# Patient Record
Sex: Male | Born: 1970 | Race: Black or African American | Hispanic: No | Marital: Single | Smoking: Never smoker
Health system: Southern US, Community
[De-identification: ages and names within clinical notes are randomized; demographics above are authoritative.]

## PROBLEM LIST (undated history)

## (undated) ENCOUNTER — Emergency Department: Admission: EM | Discharge status: Absent without leave | Payer: BLUE CROSS/BLUE SHIELD

## (undated) DIAGNOSIS — A6920 Lyme disease, unspecified: Secondary | ICD-10-CM

## (undated) DIAGNOSIS — I10 Essential (primary) hypertension: Secondary | ICD-10-CM

## (undated) DIAGNOSIS — R911 Solitary pulmonary nodule: Secondary | ICD-10-CM

## (undated) DIAGNOSIS — N200 Calculus of kidney: Secondary | ICD-10-CM

## (undated) DIAGNOSIS — Z8619 Personal history of other infectious and parasitic diseases: Secondary | ICD-10-CM

## (undated) DIAGNOSIS — E781 Pure hyperglyceridemia: Secondary | ICD-10-CM

## (undated) DIAGNOSIS — Z8701 Personal history of pneumonia (recurrent): Secondary | ICD-10-CM

## (undated) HISTORY — DX: Personal history of pneumonia (recurrent): Z87.01

## (undated) HISTORY — DX: Solitary pulmonary nodule: R91.1

## (undated) HISTORY — DX: Calculus of kidney: N20.0

## (undated) HISTORY — DX: Lyme disease, unspecified: A69.20

## (undated) HISTORY — DX: Personal history of other infectious and parasitic diseases: Z86.19

## (undated) HISTORY — DX: Essential (primary) hypertension: I10

## (undated) HISTORY — DX: Pure hyperglyceridemia: E78.1

---

## 2000-11-19 DIAGNOSIS — Z8701 Personal history of pneumonia (recurrent): Secondary | ICD-10-CM

## 2000-11-19 HISTORY — DX: Personal history of pneumonia (recurrent): Z87.01

## 2000-11-19 HISTORY — PX: US ECHOCARDIOGRAPHY: HXRAD669

## 2005-06-27 ENCOUNTER — Ambulatory Visit: Payer: Self-pay | Admitting: Internal Medicine

## 2007-07-28 ENCOUNTER — Ambulatory Visit: Payer: Self-pay | Admitting: Internal Medicine

## 2007-07-28 DIAGNOSIS — R079 Chest pain, unspecified: Secondary | ICD-10-CM | POA: Insufficient documentation

## 2007-07-29 ENCOUNTER — Encounter: Payer: Self-pay | Admitting: Internal Medicine

## 2007-07-29 DIAGNOSIS — R7989 Other specified abnormal findings of blood chemistry: Secondary | ICD-10-CM | POA: Insufficient documentation

## 2007-07-31 LAB — CONVERTED CEMR LAB
HCV Ab: NEGATIVE
Hep A IgM: NEGATIVE
Hep B C IgM: NEGATIVE

## 2007-08-11 ENCOUNTER — Ambulatory Visit: Payer: Self-pay | Admitting: Cardiology

## 2007-09-01 ENCOUNTER — Ambulatory Visit: Payer: Self-pay | Admitting: Internal Medicine

## 2007-09-01 LAB — CONVERTED CEMR LAB
ALT: 32 U/L (ref 0–53)
AST: 21 U/L (ref 0–37)
Albumin: 4 g/dL (ref 3.5–5.2)
Alkaline Phosphatase: 64 U/L (ref 39–117)
Bilirubin, Direct: 0.1 mg/dL (ref 0.0–0.3)
Total Bilirubin: 0.7 mg/dL (ref 0.3–1.2)
Total Protein: 7.8 g/dL (ref 6.0–8.3)

## 2007-09-04 ENCOUNTER — Telehealth (INDEPENDENT_AMBULATORY_CARE_PROVIDER_SITE_OTHER): Payer: Self-pay | Admitting: *Deleted

## 2008-01-20 ENCOUNTER — Telehealth (INDEPENDENT_AMBULATORY_CARE_PROVIDER_SITE_OTHER): Payer: Self-pay | Admitting: *Deleted

## 2010-12-17 LAB — CONVERTED CEMR LAB
ALT: 132 units/L — ABNORMAL HIGH (ref 0–53)
AST: 58 units/L — ABNORMAL HIGH (ref 0–37)
Alkaline Phosphatase: 58 units/L (ref 39–117)
Basophils Absolute: 0 10*3/uL (ref 0.0–0.1)
Bilirubin, Direct: 0.1 mg/dL (ref 0.0–0.3)
CO2: 33 meq/L — ABNORMAL HIGH (ref 19–32)
Creatinine, Ser: 1 mg/dL (ref 0.4–1.5)
GFR calc non Af Amer: 91 mL/min
Glucose, Bld: 117 mg/dL — ABNORMAL HIGH (ref 70–99)
HCT: 44.4 % (ref 39.0–52.0)
MCHC: 35.8 g/dL (ref 30.0–36.0)
Neutrophils Relative %: 56.7 % (ref 43.0–77.0)
Phosphorus: 3.1 mg/dL (ref 2.3–4.6)
Platelets: 271 10*3/uL (ref 150–400)
RBC: 5.21 M/uL (ref 4.22–5.81)
RDW: 11.9 % (ref 11.5–14.6)
Sodium: 142 meq/L (ref 135–145)
WBC: 7 10*3/uL (ref 4.5–10.5)

## 2011-04-03 NOTE — Assessment & Plan Note (Signed)
[facility] OFFICE NOTE   MYNOR, WITKOP                      MRN:          [id number]  DATE:08/11/2007                            DOB:          12/12/1970    Dr. Alphonsus Sias asked [place] to consult on Shawn Vaughn, a delightful 40-year-  old single black male with an abnormal EKG.  He saw Dr. Alphonsus Sias the other  day.  He complained of some left lateral thoracic pain off and on for a  year.  It was described as a cramp.  Sometimes he feels a little  tingling in his left arm.   He denies any exertional angina or ischemic equivalent.  He absolutely  has no cardiac risk factors for premature coronary disease.   His EKG in Dr. Karle Starch office showed some ST segment changes, in  particularly leads 2 and 3.  However, there was significant variation in  the isoelectric baseline.   PAST MEDICAL HISTORY:  1. Does not smoke.  2. Does not drink.  3. Does not use any recreational products.  4. He does not think he has an elevated cholesterol.  5. There is no premature history of coronary disease.  6. His mother is healthy but she has hypertension.  7. He is on no medications.  8. He has had no surgeries.   SOCIAL HISTORY:  1. He is a Loss adjuster, chartered.  2. He is single.  3. He has no children.  4. He likes to [employer].   REVIEW OF SYSTEMS:  Negative other than a history of pneumonia 4 years  ago.   EXAM:  He is very pleasant.  His blood pressure is 130/90, his pulse is  91 and regular, his weight is 183.  HEENT:  Normocephalic, atraumatic, PERRLA, extraocular movements intact.  Sclera clear.  Facial symmetry is normal.  Carotid upstrokes are equal  bilaterally without bruits, no JVD.  Thyroid is not enlarged, trachea is  midline.  LUNGS:  Clear.  There is no sign of a rub either anteriorly or  posteriorly or laterally.  His PMI is nondisplaced.  He has normal S1,  S2.  There is no murmur, rub, gallop or click  ABDOMINAL EXAM:  Soft with good bowel sounds.  There is no epigastric  tenderness.  EXTREMITIES:  No clubbing, cyanosis or edema, pulses are intact.   I repeated his EKG here in the office which is remarkable for minimal  early repolarization.  Otherwise, it is normal.   ASSESSMENT:  1. Noncardiac chest pain.  2. Abnormal EKG, probably minimal early repolarization.   PLAN:  Reassurance.   He obviously takes good care of himself.  I have asked him to continue  to do so.  Will see him back on a p.r.n. basis.     Thomas C. Daleen Squibb, MD, [facility]  Electronically Signed    TCW/MedQ  DD: 08/11/2007  DT: 08/11/2007  Job #: [id number]   cc:   Karie Schwalbe, MD

## 2011-05-11 ENCOUNTER — Encounter: Payer: Self-pay | Admitting: Cardiovascular Disease

## 2012-02-05 ENCOUNTER — Other Ambulatory Visit: Payer: Self-pay | Admitting: Family Medicine

## 2012-04-16 ENCOUNTER — Other Ambulatory Visit: Payer: Self-pay | Admitting: Family Medicine

## 2012-04-24 ENCOUNTER — Other Ambulatory Visit: Payer: Self-pay | Admitting: Family Medicine

## 2012-05-08 ENCOUNTER — Other Ambulatory Visit: Payer: Self-pay | Admitting: Physician Assistant

## 2012-05-08 ENCOUNTER — Other Ambulatory Visit: Payer: Self-pay | Admitting: Family Medicine

## 2012-05-08 NOTE — Telephone Encounter (Signed)
Need chart

## 2012-05-13 ENCOUNTER — Telehealth: Payer: Self-pay

## 2012-05-13 MED ORDER — METOPROLOL SUCCINATE ER 100 MG PO TB24: 100.0000 mg | ORAL_TABLET | Freq: Every day | ORAL | Status: DC

## 2012-05-13 NOTE — Telephone Encounter (Signed)
FRANCES STATES HER SON WAS JUST SEEN FOR HIS DOT LAST WEEK, BUT THE PHARMACY STATES WE DENIED THE METOPROLOL AND SHE REALLY WOULD LIKE [place] TO GIVE HIM A FEW UNTIL ABLE TO COME IN AND SEE DR. 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

## 2012-05-13 NOTE — Telephone Encounter (Signed)
Spoke with pt mother advised her that we will call in refill until he get in.  He will be in this weekend for ov.  Explained to mother about why we need to do bloodwork.

## 2012-06-05 ENCOUNTER — Other Ambulatory Visit: Payer: Self-pay | Admitting: *Deleted

## 2012-06-05 MED ORDER — METOPROLOL SUCCINATE ER 100 MG PO TB24: 100.0000 mg | ORAL_TABLET | Freq: Every day | ORAL | Status: DC

## 2012-06-22 ENCOUNTER — Ambulatory Visit (INDEPENDENT_AMBULATORY_CARE_PROVIDER_SITE_OTHER): Payer: BC Managed Care – PPO | Admitting: Physician Assistant

## 2012-06-22 VITALS — BP 140/78 | HR 78 | Temp 98.7°F | Resp 18 | Ht 67.25 in | Wt 186.0 lb

## 2012-06-22 DIAGNOSIS — E7800 Pure hypercholesterolemia, unspecified: Secondary | ICD-10-CM

## 2012-06-22 DIAGNOSIS — I10 Essential (primary) hypertension: Secondary | ICD-10-CM

## 2012-06-22 DIAGNOSIS — I159 Secondary hypertension, unspecified: Secondary | ICD-10-CM | POA: Insufficient documentation

## 2012-06-22 LAB — COMPREHENSIVE METABOLIC PANEL
ALT: 27 U/L (ref 0–53)
AST: 22 U/L (ref 0–37)
Albumin: 4.3 g/dL (ref 3.5–5.2)
Calcium: 9.9 mg/dL (ref 8.4–10.5)
Chloride: 102 mEq/L (ref 96–112)
Potassium: 4.1 mEq/L (ref 3.5–5.3)

## 2012-06-22 MED ORDER — HYDROCHLOROTHIAZIDE 12.5 MG PO CAPS
12.5000 mg | ORAL_CAPSULE | Freq: Every day | ORAL | Status: DC
Start: 2012-06-22 — End: 2013-01-15

## 2012-06-22 MED ORDER — METOPROLOL SUCCINATE ER 100 MG PO TB24: 100.0000 mg | ORAL_TABLET | Freq: Every day | ORAL | Status: DC

## 2012-06-22 NOTE — Progress Notes (Signed)
  Subjective:    Patient ID: Shawn Vaughn, male    DOB: 1971-04-20, 41 y.o.   MRN: [id number]  HPI Pt presents to clinic for BP recheck and medication refill.  He is having no problems with his medications.   Bp readings at home - 120s/80s  Review of Systems  Constitutional: Negative for fever, chills and fatigue.  Respiratory: Negative for cough and chest tightness.   Cardiovascular: Negative for chest pain and leg swelling.       Objective:   Physical Exam  Vitals reviewed. Constitutional: He is oriented to person, place, and time. He appears well-developed and well-nourished.  HENT:  Head: Normocephalic and atraumatic.  Right Ear: External ear normal.  Left Ear: External ear normal.  Nose: Nose normal.  Mouth/Throat: Oropharynx is clear and moist.  Eyes: Conjunctivae are normal.  Neck: Neck supple.  Cardiovascular: Normal rate, regular rhythm and normal heart sounds.   Pulmonary/Chest: Effort normal and breath sounds normal.  Neurological: He is alert and oriented to person, place, and time.  Skin: Skin is warm and dry.  Psychiatric: He has a normal mood and affect. His behavior is normal. Judgment and thought content normal.          Assessment & Plan:   1. HTN (hypertension)  Comprehensive metabolic panel, Lipid panel, metoprolol succinate (TOPROL-XL) 100 MG 24 hr tablet, hydrochlorothiazide (MICROZIDE) 12.5 MG capsule   Recheck 6 months.

## 2012-06-26 MED ORDER — ATORVASTATIN CALCIUM 10 MG PO TABS: 10.0000 mg | ORAL_TABLET | Freq: Every day | ORAL | Status: DC

## 2012-06-26 NOTE — Addendum Note (Signed)
Addended by: Morrell Riddle on: 06/26/2012 03:34 PM   Modules accepted: Orders

## 2012-06-30 ENCOUNTER — Telehealth: Payer: Self-pay

## 2012-06-30 NOTE — Telephone Encounter (Signed)
[phone] is correct # I have called his wife back to advise I am not aware of Metoprolol causing elevated cholesterol, she indicates she has seen that it can elevate it. I advised looking at labs there is concern he may not have been fasting, she advised he was fasting for these labs. She does not want his meds changed, he has not started his cholesterol meds yet and she is advised when levels get this high, it is reccommended he take something to get this number down.

## 2012-06-30 NOTE — Telephone Encounter (Signed)
Wants to know if high colestrol is caused by [employer] at [phone]

## 2012-09-01 ENCOUNTER — Ambulatory Visit (INDEPENDENT_AMBULATORY_CARE_PROVIDER_SITE_OTHER): Payer: Self-pay | Admitting: Family Medicine

## 2012-09-01 ENCOUNTER — Encounter: Payer: Self-pay | Admitting: Family Medicine

## 2012-09-01 VITALS — BP 124/86 | HR 60 | Temp 98.1°F | Ht 67.0 in | Wt 186.0 lb

## 2012-09-01 DIAGNOSIS — Z8701 Personal history of pneumonia (recurrent): Secondary | ICD-10-CM

## 2012-09-01 DIAGNOSIS — I10 Essential (primary) hypertension: Secondary | ICD-10-CM

## 2012-09-01 DIAGNOSIS — E781 Pure hyperglyceridemia: Secondary | ICD-10-CM | POA: Insufficient documentation

## 2012-09-01 LAB — LIPID PANEL
Cholesterol: 167 mg/dL (ref 0–200)
HDL: 34.9 mg/dL — ABNORMAL LOW (ref >39.00)
Total CHOL/HDL Ratio: 5
Triglycerides: 541 mg/dL — ABNORMAL HIGH (ref 0.0–149.0)
VLDL: 108.2 mg/dL — ABNORMAL HIGH (ref 0.0–40.0)

## 2012-09-01 LAB — LDL CHOLESTEROL, DIRECT: Direct LDL: 47.6 mg/dL

## 2012-09-01 NOTE — Assessment & Plan Note (Signed)
Discussed reasons for high trig, as well as dietary interventions to decrease level. No h/o DM, no h/o gallstones, no h/o EtOH use.   Recheck today.  If staying elevated, start fibrate.

## 2012-09-01 NOTE — Assessment & Plan Note (Signed)
Chronic, stable. Continue meds. 

## 2012-09-01 NOTE — Progress Notes (Signed)
Subjective:    Patient ID: Shawn Vaughn, male    DOB: [birth year], 41 y.o.   MRN: [id number]  HPI CC: new pt to establish  Prior saw Dr. Alphonsus Sias, but not recently.  HTN - compliant with meds.  No HA, vision changes, CP/tightness, SOB, leg swelling.   Hypertriglyceridemia - was started on lipitor by [place] but not currently taking.  Denies EtOH use, no h/o gallstones.  No recent thyroid.  Caffeine: occasional sodas Lives with mom, and dad but has his own place, 4 dogs Occupation: works at [employer]: HS Activity: hunts 3x/wk Diet: good water, fruits/vegetables daily  Preventative:  DOT done 04/05/2012 Flu shot - declines Tetanus shot - unsure.  Declines today.  Medications and allergies reviewed and updated in chart.  Past histories reviewed and updated if relevant as below. Patient Active Problem List  Diagnosis  . CHEST PAIN  . ABNORMAL BLOOD CHEMISTRY NEC  . HTN (hypertension)   Past Medical History  Diagnosis Date  . Hypertension   . History of pneumonia 2002    staph  . Hypertriglyceridemia    No past surgical history on file. History  Substance Use Topics  . Smoking status: Never Smoker   . Smokeless tobacco: Never Used  . Alcohol Use: No   Family History  Problem Relation Age of Onset  . Hypertension Mother   . Hyperlipidemia Neg Hx   . Diabetes Neg Hx   . Coronary artery disease Neg Hx   . Stroke Neg Hx   . Cancer Maternal Grandfather     prostate   No Known Allergies Current Outpatient Prescriptions on File Prior to Visit  Medication Sig Dispense Refill  . hydrochlorothiazide (MICROZIDE) 12.5 MG capsule Take 1 capsule (12.5 mg total) by mouth daily.  90 capsule  1  . metoprolol succinate (TOPROL-XL) 100 MG 24 hr tablet Take 1 tablet (100 mg total) by mouth daily.  90 tablet  1     Review of Systems  Constitutional: Negative for fever, chills, activity change, appetite change, fatigue and unexpected weight change.  HENT: Negative for hearing loss  and neck pain.   Eyes: Negative for visual disturbance.  Respiratory: Negative for cough, chest tightness, shortness of breath and wheezing.   Cardiovascular: Negative for chest pain, palpitations and leg swelling.  Gastrointestinal: Negative for nausea, vomiting, abdominal pain, diarrhea, constipation, blood in stool and abdominal distention.  Genitourinary: Negative for hematuria and difficulty urinating.  Musculoskeletal: Negative for myalgias and arthralgias.  Skin: Negative for rash.  Neurological: Negative for dizziness, seizures, syncope and headaches.  Hematological: Does not bruise/bleed easily.  Psychiatric/Behavioral: Negative for dysphoric mood. The patient is not nervous/anxious.        Objective:   Physical Exam  Nursing note and vitals reviewed. Constitutional: He is oriented to person, place, and time. He appears well-developed and well-nourished. No distress.  HENT:  Head: Normocephalic and atraumatic.  Right Ear: External ear normal.  Left Ear: External ear normal.  Nose: Nose normal.  Mouth/Throat: Oropharynx is clear and moist. No oropharyngeal exudate.  Eyes: Conjunctivae normal and EOM are normal. Pupils are equal, round, and reactive to light. No scleral icterus.  Neck: Normal range of motion. Neck supple.  Cardiovascular: Normal rate, regular rhythm, normal heart sounds and intact distal pulses.   No murmur heard. Pulses:      Radial pulses are 2+ on the right side, and 2+ on the left side.  Pulmonary/Chest: Effort normal and breath sounds normal. No respiratory distress. He  has no wheezes. He has no rales.  Abdominal: Soft. Bowel sounds are normal. He exhibits no distension and no mass. There is no tenderness. There is no rebound and no guarding.  Musculoskeletal: Normal range of motion. He exhibits no edema.  Lymphadenopathy:    He has no cervical adenopathy.  Neurological: He is alert and oriented to person, place, and time.       CN grossly intact,  station and gait intact  Skin: Skin is warm and dry. No rash noted.  Psychiatric: He has a normal mood and affect. His behavior is normal. Judgment and thought content normal.       Assessment & Plan:

## 2012-09-01 NOTE — Assessment & Plan Note (Signed)
asxs. 

## 2012-09-01 NOTE — Patient Instructions (Signed)
Good to meet you today, call us with questions. Blood work today - if triglycerides staying elevated, I will recommend starting fenofibrate or tricor (We will call you with results.) Decrease added sugars, eliminate trans fats, increase fiber and limit alcohol.  All these changes together can drop triglycerides by almost 50%.

## 2012-09-03 ENCOUNTER — Other Ambulatory Visit: Payer: Self-pay | Admitting: Family Medicine

## 2012-09-03 MED ORDER — FENOFIBRATE 145 MG PO TABS: 145.0000 mg | ORAL_TABLET | Freq: Every day | ORAL | Status: DC

## 2012-10-04 ENCOUNTER — Other Ambulatory Visit: Payer: Self-pay | Admitting: Physician Assistant

## 2012-10-06 ENCOUNTER — Telehealth: Payer: Self-pay

## 2012-10-06 NOTE — Telephone Encounter (Signed)
I doubt related to fenofibrate.  May restart.  rec come in for eval.

## 2012-10-06 NOTE — Telephone Encounter (Signed)
Pt taking Fenofibrate since middle of Oct. Noticed today rash on rt side of neck size of quarter with slight itching. No difficulty swallowing or breathing. Has not changed detergents or soaps etc; nothing new. Pt wondered if could be reaction to Fenofibrate? Pt said has nothing new since starting Fenofibrate.Pt advised not to take Fenofibrate until hears back from Dr Sharen Hones. If condition changes or worsens pt to go to [facility].Please advise.

## 2012-10-07 NOTE — Telephone Encounter (Signed)
Patient notified and appt scheduled.

## 2012-10-09 ENCOUNTER — Encounter: Payer: Self-pay | Admitting: Family Medicine

## 2012-10-09 ENCOUNTER — Ambulatory Visit (INDEPENDENT_AMBULATORY_CARE_PROVIDER_SITE_OTHER): Payer: BC Managed Care – PPO | Admitting: Family Medicine

## 2012-10-09 VITALS — BP 112/78 | HR 80 | Temp 98.2°F | Wt 188.0 lb

## 2012-10-09 DIAGNOSIS — R21 Rash and other nonspecific skin eruption: Secondary | ICD-10-CM

## 2012-10-09 NOTE — Patient Instructions (Signed)
Skin rash could have been allergic reaction to something or skin irritaiton from rubbing. Could try moisturizer if this happens again, or some topical cortisone to calm down inflammation. If worsening despite cortisone, let me know. I don't think this was the cholesterol medicine.

## 2012-10-09 NOTE — Assessment & Plan Note (Signed)
Focal papular rash, resolving. Not med related. Anticipate irritated skin from rubbing on coon swallow. Reassured, discussed future care (moisturizer and OTC cortisone). Not consistent with tinea.

## 2012-10-09 NOTE — Progress Notes (Signed)
  Subjective:    Patient ID: Shawn Vaughn, male    DOB: 1971-04-13, 41 y.o.   MRN: [id number]  HPI CC: neck rash  Present for 1 wk, now resolving.  Was very pruritic.  Bumpy irritated spot.  No new lotions, detergents, soaps, shampoos. No fevers/chills, oral lesions.  Coon hunts, had coon swallow that may have been rubbing against neck.  Declines tetanus and flu shots today.  Past Medical History  Diagnosis Date  . Hypertension   . History of pneumonia 2002    staph  . Hypertriglyceridemia   . History of chicken pox      Review of Systems Per HPI    Objective:   Physical Exam  Nursing note and vitals reviewed. Skin:       Very faint papular rash right lateral nape of neck, resolving       Assessment & Plan:

## 2012-10-27 ENCOUNTER — Other Ambulatory Visit: Payer: Self-pay | Admitting: *Deleted

## 2012-10-27 MED ORDER — FENOFIBRATE 145 MG PO TABS: 145.0000 mg | ORAL_TABLET | Freq: Every day | ORAL | Status: DC

## 2012-10-28 ENCOUNTER — Other Ambulatory Visit: Payer: Self-pay | Admitting: *Deleted

## 2012-12-17 ENCOUNTER — Other Ambulatory Visit: Payer: Self-pay | Admitting: Physician Assistant

## 2012-12-17 NOTE — Telephone Encounter (Signed)
Needs office visit.

## 2013-01-12 ENCOUNTER — Other Ambulatory Visit: Payer: Self-pay | Admitting: Physician Assistant

## 2013-01-15 ENCOUNTER — Other Ambulatory Visit: Payer: Self-pay | Admitting: Physician Assistant

## 2013-03-12 ENCOUNTER — Encounter: Payer: Self-pay | Admitting: Family Medicine

## 2013-03-12 ENCOUNTER — Ambulatory Visit (INDEPENDENT_AMBULATORY_CARE_PROVIDER_SITE_OTHER): Payer: BC Managed Care – PPO | Admitting: Family Medicine

## 2013-03-12 ENCOUNTER — Encounter: Payer: Self-pay | Admitting: *Deleted

## 2013-03-12 VITALS — BP 130/88 | HR 72 | Temp 98.0°F | Wt 185.5 lb

## 2013-03-12 DIAGNOSIS — M25512 Pain in left shoulder: Secondary | ICD-10-CM | POA: Insufficient documentation

## 2013-03-12 DIAGNOSIS — E781 Pure hyperglyceridemia: Secondary | ICD-10-CM

## 2013-03-12 DIAGNOSIS — M25519 Pain in unspecified shoulder: Secondary | ICD-10-CM

## 2013-03-12 LAB — LIPID PANEL
LDL Cholesterol: 67 mg/dL (ref 0–99)
VLDL: 31.8 mg/dL (ref 0.0–40.0)

## 2013-03-12 LAB — HEPATIC FUNCTION PANEL
AST: 31 U/L (ref 0–37)
Alkaline Phosphatase: 33 U/L — ABNORMAL LOW (ref 39–117)
Total Bilirubin: 0.6 mg/dL (ref 0.3–1.2)

## 2013-03-12 MED ORDER — DICLOFENAC SODIUM 1 % TD GEL: 1.0000 "application " | Freq: Three times a day (TID) | TRANSDERMAL | Status: DC

## 2013-03-12 NOTE — Patient Instructions (Signed)
I think you have rotator cuff injury - treat with tylenol 500mg  two to three times daily, ice/heat, and exercises provided today. May also use voltaren gel to left shoulder as needed - sent into pharmacy. Blood work to check triglycerides today. Light duty for 1 wk at work. Return if not improving as expected or any worsening.

## 2013-03-12 NOTE — Assessment & Plan Note (Addendum)
Of 10 d duration, no inciting injury noted. Anticipate rotator cuff injury - intolerant of oral NSAIDs. Treat with tylenol, voltaren gel, and ice/heat. Provided with stretching exercises from [facility] pt advisor. Update if persistent or not improving as expected. Light duty at work for 1 week.

## 2013-03-12 NOTE — Progress Notes (Signed)
  Subjective:    Patient ID: Shawn Vaughn, male    DOB: Jul 11, 1971, 42 y.o.   MRN: [id number]  HPI CC: R shoulder pain  Endorses 1.5 wk h/o anterior L shoulder pain worse with raising arm above midline.  Laying on shoulder worsens pain as well.  No h/o shoulder problems in past.  Actually improved compared to last 2 days.  Hasn't tried meds for this.  Has tired ice which helped some.  Denies neck pain, shooting pain down arms, weakness or numbness of arms. Denies inciting trauma, falls, injury  No chest pain, dyspnea, nausea.  Activity - hunts, not recently. 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  Ibuprofen caused bleeding in stool 20 yrs ago.  Told he was allergic to this.  Also would like triglycerides checked.  Has been on fenofibrate since 08/2012.  No myalgias, tolerating well.  Past Medical History  Diagnosis Date  . Hypertension   . History of pneumonia 2002    staph  . Hypertriglyceridemia   . History of chicken pox      Review of Systems Per HPI    Objective:   Physical Exam  Nursing note and vitals reviewed. Constitutional: He appears well-developed and well-nourished. No distress.  Musculoskeletal:  No midline spine tenderness. No pain to palpation at shoulders, no deformity. FROM at shoulders, but tender at left shoulder between 45-120 degree abduction Neg empty can sign.  No pain with int/ext rotation against resistance. Neg speed/yerguson test No impingement. Neg crossover test. No pain with rotation of humeral head in GH joint.       Assessment & Plan:

## 2013-03-12 NOTE — Assessment & Plan Note (Signed)
Check blood work today.

## 2013-03-21 ENCOUNTER — Other Ambulatory Visit: Payer: Self-pay | Admitting: Emergency Medicine

## 2013-07-28 ENCOUNTER — Other Ambulatory Visit: Payer: Self-pay | Admitting: Family Medicine

## 2013-09-02 ENCOUNTER — Encounter: Payer: Self-pay | Admitting: Family Medicine

## 2013-09-02 ENCOUNTER — Other Ambulatory Visit: Payer: Self-pay | Admitting: Family Medicine

## 2013-09-02 ENCOUNTER — Ambulatory Visit (INDEPENDENT_AMBULATORY_CARE_PROVIDER_SITE_OTHER): Payer: BC Managed Care – PPO | Admitting: Family Medicine

## 2013-09-02 VITALS — BP 122/86 | HR 68 | Temp 98.2°F | Ht 67.0 in | Wt 186.2 lb

## 2013-09-02 DIAGNOSIS — I10 Essential (primary) hypertension: Secondary | ICD-10-CM

## 2013-09-02 DIAGNOSIS — R748 Abnormal levels of other serum enzymes: Secondary | ICD-10-CM | POA: Insufficient documentation

## 2013-09-02 DIAGNOSIS — N289 Disorder of kidney and ureter, unspecified: Secondary | ICD-10-CM

## 2013-09-02 DIAGNOSIS — Z0001 Encounter for general adult medical examination with abnormal findings: Secondary | ICD-10-CM | POA: Insufficient documentation

## 2013-09-02 DIAGNOSIS — E781 Pure hyperglyceridemia: Secondary | ICD-10-CM

## 2013-09-02 DIAGNOSIS — Z0000 Encounter for general adult medical examination without abnormal findings: Secondary | ICD-10-CM | POA: Insufficient documentation

## 2013-09-02 DIAGNOSIS — Z23 Encounter for immunization: Secondary | ICD-10-CM

## 2013-09-02 LAB — COMPREHENSIVE METABOLIC PANEL
ALT: 45 U/L (ref 0–53)
Albumin: 4.4 g/dL (ref 3.5–5.2)
CO2: 29 mEq/L (ref 19–32)
Calcium: 9.7 mg/dL (ref 8.4–10.5)
Chloride: 100 mEq/L (ref 96–112)
GFR: 62.98 mL/min (ref >60.00)
Potassium: 3.9 mEq/L (ref 3.5–5.1)
Sodium: 137 mEq/L (ref 135–145)
Total Bilirubin: 0.7 mg/dL (ref 0.3–1.2)
Total Protein: 8.2 g/dL (ref 6.0–8.3)

## 2013-09-02 LAB — LIPID PANEL: Total CHOL/HDL Ratio: 4

## 2013-09-02 MED ORDER — FENOFIBRATE 145 MG PO TABS: ORAL_TABLET | ORAL | Status: DC

## 2013-09-02 MED ORDER — HYDROCHLOROTHIAZIDE 12.5 MG PO CAPS: ORAL_CAPSULE | ORAL | Status: DC

## 2013-09-02 MED ORDER — METOPROLOL SUCCINATE ER 100 MG PO TB24: ORAL_TABLET | ORAL | Status: DC

## 2013-09-02 NOTE — Assessment & Plan Note (Signed)
Preventative protocols reviewed and updated unless pt declined. Discussed healthy diet and lifestyle.  Declines flu shot today. Tdap today.

## 2013-09-02 NOTE — Addendum Note (Signed)
Addended by: Josph Macho A on: 09/02/2013 09:23 AM   Modules accepted: Orders

## 2013-09-02 NOTE — Assessment & Plan Note (Signed)
Chronic, stable. Continue meds. 

## 2013-09-02 NOTE — Patient Instructions (Signed)
Tdap today (tetanus and pertussis shots) Blood work today Return as needed or in 1 year for next physical or med refill visit.

## 2013-09-02 NOTE — Progress Notes (Signed)
Subjective:    Patient ID: Shawn Vaughn, male    DOB: [birth year], 42 y.o.   MRN: [id number]  HPI CC: CPE  HTN - see ROS.  Compliant with meds. Hypertriglyceridemia - compliant krill oil and tricor regularly.  Due for blood work.  Planning on having baby with fiancee.  Seat belt use discussed. No suspicious lesions on skin that he's aware of.  Caffeine: occasional sodas Lives with mom, and dad but has his own place, 4 dogs Occupation: works at [employer]: HS Activity: hunts 3x/wk Diet: good water, fruits/vegetables daily  Preventative: fmhx prostate cancer 70s. Tdap today. Declines flu shot.  Medications and allergies reviewed and updated in chart.  Past histories reviewed and updated if relevant as below. Patient Active Problem List   Diagnosis Date Noted  . Left anterior shoulder pain 03/12/2013  . Skin rash 10/09/2012  . Hypertriglyceridemia   . History of pneumonia   . HTN (hypertension) 06/22/2012  . CHEST PAIN 07/28/2007   Past Medical History  Diagnosis Date  . Hypertension   . History of pneumonia 2002    staph  . Hypertriglyceridemia   . History of chicken pox    No past surgical history on file. History  Substance Use Topics  . Smoking status: Never Smoker   . Smokeless tobacco: Never Used  . Alcohol Use: No   Family History  Problem Relation Age of Onset  . Hypertension Mother   . Hyperlipidemia Neg Hx   . Diabetes Neg Hx   . Coronary artery disease Neg Hx   . Stroke Neg Hx   . Cancer Maternal Grandfather     prostate   Allergies  Allergen Reactions  . Ibuprofen     Blood in stool   Current Outpatient Prescriptions on File Prior to Visit  Medication Sig Dispense Refill  . OMEGA-3 KRILL OIL 300 MG CAPS Take 1 capsule by mouth daily.       No current facility-administered medications on file prior to visit.     Review of Systems  Constitutional: Negative for fever, chills, activity change, appetite change, fatigue and unexpected  weight change.  HENT: Negative for hearing loss.   Eyes: Negative for visual disturbance.  Respiratory: Negative for cough, chest tightness, shortness of breath and wheezing.   Cardiovascular: Negative for chest pain, palpitations and leg swelling.  Gastrointestinal: Negative for nausea, vomiting, abdominal pain, diarrhea, constipation, blood in stool and abdominal distention.  Genitourinary: Negative for hematuria and difficulty urinating.  Musculoskeletal: Negative for arthralgias, myalgias and neck pain.  Skin: Negative for rash.  Neurological: Negative for dizziness, seizures, syncope and headaches.  Hematological: Negative for adenopathy. Does not bruise/bleed easily.  Psychiatric/Behavioral: Negative for dysphoric mood. The patient is not nervous/anxious.        Objective:   Physical Exam  Nursing note and vitals reviewed. Constitutional: He is oriented to person, place, and time. He appears well-developed and well-nourished. No distress.  HENT:  Head: Normocephalic and atraumatic.  Right Ear: Hearing, tympanic membrane, external ear and ear canal normal.  Left Ear: Hearing, tympanic membrane, external ear and ear canal normal.  Nose: Nose normal.  Mouth/Throat: Oropharynx is clear and moist. No oropharyngeal exudate.  Eyes: Conjunctivae and EOM are normal. Pupils are equal, round, and reactive to light. No scleral icterus.  Neck: Normal range of motion. Neck supple. No thyromegaly present.  Cardiovascular: Normal rate, regular rhythm, normal heart sounds and intact distal pulses.   No murmur heard. Pulses:  Radial pulses are 2+ on the right side, and 2+ on the left side.  Pulmonary/Chest: Effort normal and breath sounds normal. No respiratory distress. He has no wheezes. He has no rales.  Abdominal: Soft. Bowel sounds are normal. He exhibits no distension and no mass. There is no tenderness. There is no rebound and no guarding.  Musculoskeletal: Normal range of motion. He  exhibits no edema.  Lymphadenopathy:    He has no cervical adenopathy.  Neurological: He is alert and oriented to person, place, and time.  CN grossly intact, station and gait intact  Skin: Skin is warm and dry. No rash noted.  Psychiatric: He has a normal mood and affect. His behavior is normal. Judgment and thought content normal.      Assessment & Plan:

## 2013-09-02 NOTE — Assessment & Plan Note (Signed)
Check blood work today.  Compliant with krill and tricor.

## 2013-09-03 ENCOUNTER — Encounter: Payer: Self-pay | Admitting: Family Medicine

## 2014-01-06 ENCOUNTER — Ambulatory Visit: Payer: BC Managed Care – PPO | Admitting: Family Medicine

## 2014-01-12 ENCOUNTER — Ambulatory Visit (INDEPENDENT_AMBULATORY_CARE_PROVIDER_SITE_OTHER): Payer: BC Managed Care – PPO | Admitting: Family Medicine

## 2014-01-12 ENCOUNTER — Ambulatory Visit: Payer: BC Managed Care – PPO | Admitting: Family Medicine

## 2014-01-12 ENCOUNTER — Encounter: Payer: Self-pay | Admitting: Family Medicine

## 2014-01-12 VITALS — BP 136/90 | HR 84 | Temp 98.2°F | Wt 186.2 lb

## 2014-01-12 DIAGNOSIS — E781 Pure hyperglyceridemia: Secondary | ICD-10-CM

## 2014-01-12 DIAGNOSIS — R7989 Other specified abnormal findings of blood chemistry: Secondary | ICD-10-CM | POA: Insufficient documentation

## 2014-01-12 DIAGNOSIS — R7402 Elevation of levels of lactic acid dehydrogenase (LDH): Secondary | ICD-10-CM

## 2014-01-12 DIAGNOSIS — I10 Essential (primary) hypertension: Secondary | ICD-10-CM

## 2014-01-12 DIAGNOSIS — N183 Chronic kidney disease, stage 3 unspecified: Secondary | ICD-10-CM | POA: Insufficient documentation

## 2014-01-12 DIAGNOSIS — R740 Nonspecific elevation of levels of transaminase and lactic acid dehydrogenase [LDH]: Secondary | ICD-10-CM

## 2014-01-12 DIAGNOSIS — N289 Disorder of kidney and ureter, unspecified: Secondary | ICD-10-CM | POA: Insufficient documentation

## 2014-01-12 DIAGNOSIS — R799 Abnormal finding of blood chemistry, unspecified: Secondary | ICD-10-CM

## 2014-01-12 DIAGNOSIS — N182 Chronic kidney disease, stage 2 (mild): Secondary | ICD-10-CM | POA: Insufficient documentation

## 2014-01-12 DIAGNOSIS — R748 Abnormal levels of other serum enzymes: Secondary | ICD-10-CM

## 2014-01-12 DIAGNOSIS — R7401 Elevation of levels of liver transaminase levels: Secondary | ICD-10-CM

## 2014-01-12 NOTE — Progress Notes (Signed)
   BP 136/90  Pulse 84  Temp(Src) 98.2 F (36.8 C) (Oral)  Wt 186 lb 4 oz (84.482 kg)   CC: lab f/u visit  Subjective:    Patient ID: Shawn Vaughn, male    DOB: 16-Dec-1970, 43 y.o.   MRN: [id number]  HPI: Shawn Vaughn is a 43 y.o. male presenting on 01/12/2014 with Follow-up   Last visit blood work Cr bumped to 1.6.  Not on any nephrotoxin agents. No NSAIDs.  No goody powders. No EtOH.  HTN - Compliant with current antihypertensive regimen of toprol xl 17m daily and hctz 12.551mdaily.  No low blood pressure readings or symptoms of dizziness/syncope.  Denies HA, vision changes, SOB, leg swelling.   Occasional chest discomfort - not exertional, not relieved by rest.  No sharp or burning pain.  Resolves on its own over few minutes.  Has had eval by cards in past, ok.  Reviewed paper chart - normal echo 2002. H/o PNA - no current fever, cough.  Relevant past medical, surgical, family and social history reviewed and updated as indicated.  Allergies and medications reviewed and updated. Current Outpatient Prescriptions on File Prior to Visit  Medication Sig  . fenofibrate (TRICOR) 145 MG tablet TAKE 1 TABLET (145 MG TOTAL) BY MOUTH DAILY.  . hydrochlorothiazide (MICROZIDE) 12.5 MG capsule TAKE 1 CAPSULE (12.5 MG TOTAL) BY MOUTH DAILY.  . metoprolol succinate (TOPROL-XL) 100 MG 24 hr tablet TAKE 1 TABLET (100 MG TOTAL) BY MOUTH DAILY.  . Marland KitchenMEGA-3 KRILL OIL 300 MG CAPS Take 1 capsule by mouth daily.   No current facility-administered medications on file prior to visit.    Review of Systems Per HPI unless specifically indicated above    Objective:    BP 136/90  Pulse 84  Temp(Src) 98.2 F (36.8 C) (Oral)  Wt 186 lb 4 oz (84.482 kg)  Physical Exam  Nursing note and vitals reviewed. Constitutional: He appears well-developed and well-nourished. No distress.  Cardiovascular: Normal rate, regular rhythm, normal heart sounds and intact distal pulses.   No murmur  heard. Pulmonary/Chest: Effort normal and breath sounds normal. No respiratory distress. He has no wheezes. He has no rales.  Musculoskeletal: He exhibits no edema.  Skin: Skin is warm and dry. No rash noted.  Psychiatric: He has a normal mood and affect.       Assessment & Plan:   Problem List Items Addressed This Visit   Abnormal alkaline phosphatase test   Elevated serum creatinine     Isolated finding last blood work - recheck today.    HTN (hypertension)     Chronic, stable. Continue meds. Check EKG for baseline - last done 2008, but not in our EMR.  Doubt chest pain cardiac in origin - atypical.  EKG today - NSR rate 70, normal axis, intervals, no acute ST/T changes, good R wave progression, isolated ST flattening compared to prior EKG 2008 (paper chart)    Relevant Orders      EKG 12-Lead (Completed)   Hypertriglyceridemia     Pt to restart krill oil - had not been taking. Continue tricor.  Recent FLP stable     Other Visit Diagnoses   Renal insufficiency    -  Primary        Follow up plan: Return if symptoms worsen or fail to improve.

## 2014-01-12 NOTE — Assessment & Plan Note (Signed)
Isolated finding last blood work - recheck today.

## 2014-01-12 NOTE — Assessment & Plan Note (Addendum)
Chronic, stable. Continue meds. Check EKG for baseline - last done 2008, but not in our EMR.  Doubt chest pain cardiac in origin - atypical.  EKG today - NSR rate 70, normal axis, intervals, no acute ST/T changes, good R wave progression, isolated ST flattening compared to prior EKG 2008 (paper chart)

## 2014-01-12 NOTE — Progress Notes (Signed)
Pre visit review using our clinic review tool, if applicable. No additional management support is needed unless otherwise documented below in the visit note. 

## 2014-01-12 NOTE — Patient Instructions (Addendum)
For headache may take tylenol as on bottle. For colds, may take corsedin or robitussin/dimetapp (but no decongestants like pseudophed) Let's recheck blood work today. EKG today for baseline.

## 2014-01-12 NOTE — Assessment & Plan Note (Signed)
Pt to restart krill oil - had not been taking. Continue tricor.  Recent FLP stable

## 2014-01-13 ENCOUNTER — Other Ambulatory Visit: Payer: Self-pay | Admitting: Family Medicine

## 2014-01-13 LAB — COMPREHENSIVE METABOLIC PANEL
ALBUMIN: 4.1 g/dL (ref 3.5–5.2)
ALK PHOS: 30 U/L — AB (ref 39–117)
ALT: 38 U/L (ref 0–53)
AST: 30 U/L (ref 0–37)
BUN: 19 mg/dL (ref 6–23)
CO2: 29 meq/L (ref 19–32)
Calcium: 9.5 mg/dL (ref 8.4–10.5)
Chloride: 103 mEq/L (ref 96–112)
Creatinine, Ser: 1.5 mg/dL (ref 0.4–1.5)
GFR: 67.83 mL/min (ref >60.00)
Glucose, Bld: 84 mg/dL (ref 70–99)
POTASSIUM: 4 meq/L (ref 3.5–5.1)
SODIUM: 138 meq/L (ref 135–145)
TOTAL PROTEIN: 7.7 g/dL (ref 6.0–8.3)
Total Bilirubin: 0.8 mg/dL (ref 0.3–1.2)

## 2014-01-13 LAB — PHOSPHORUS: Phosphorus: 2.4 mg/dL (ref 2.3–4.6)

## 2014-01-13 MED ORDER — AMLODIPINE BESYLATE 5 MG PO TABS: 5.0000 mg | ORAL_TABLET | Freq: Every day | ORAL | Status: DC

## 2014-03-02 ENCOUNTER — Other Ambulatory Visit: Payer: Self-pay | Admitting: Family Medicine

## 2014-03-03 ENCOUNTER — Other Ambulatory Visit: Payer: Self-pay | Admitting: *Deleted

## 2014-03-03 MED ORDER — AMLODIPINE BESYLATE 5 MG PO TABS: 5.0000 mg | ORAL_TABLET | Freq: Every day | ORAL | Status: DC

## 2014-04-14 ENCOUNTER — Telehealth: Payer: Self-pay | Admitting: Family Medicine

## 2014-04-14 ENCOUNTER — Ambulatory Visit (INDEPENDENT_AMBULATORY_CARE_PROVIDER_SITE_OTHER): Payer: BC Managed Care – PPO | Admitting: Family Medicine

## 2014-04-14 ENCOUNTER — Encounter: Payer: Self-pay | Admitting: Family Medicine

## 2014-04-14 VITALS — BP 138/96 | HR 66 | Temp 97.8°F | Wt 187.0 lb

## 2014-04-14 DIAGNOSIS — R799 Abnormal finding of blood chemistry, unspecified: Secondary | ICD-10-CM

## 2014-04-14 DIAGNOSIS — I10 Essential (primary) hypertension: Secondary | ICD-10-CM

## 2014-04-14 DIAGNOSIS — R7989 Other specified abnormal findings of blood chemistry: Secondary | ICD-10-CM

## 2014-04-14 DIAGNOSIS — M79672 Pain in left foot: Secondary | ICD-10-CM | POA: Insufficient documentation

## 2014-04-14 DIAGNOSIS — M79609 Pain in unspecified limb: Secondary | ICD-10-CM

## 2014-04-14 LAB — MICROALBUMIN / CREATININE URINE RATIO
Creatinine,U: 199.6 mg/dL
MICROALB UR: 0.2 mg/dL (ref 0.0–1.9)
Microalb Creat Ratio: 0.1 mg/g (ref 0.0–30.0)

## 2014-04-14 LAB — CREATININE, SERUM: Creatinine, Ser: 1.5 mg/dL (ref 0.4–1.5)

## 2014-04-14 MED ORDER — FENOFIBRATE 145 MG PO TABS: ORAL_TABLET | ORAL | Status: DC

## 2014-04-14 MED ORDER — AMLODIPINE BESYLATE 5 MG PO TABS: 5.0000 mg | ORAL_TABLET | Freq: Every day | ORAL | Status: DC

## 2014-04-14 MED ORDER — METOPROLOL SUCCINATE ER 100 MG PO TB24: ORAL_TABLET | ORAL | Status: DC

## 2014-04-14 NOTE — Assessment & Plan Note (Signed)
Endorses good control at home at goal <130/80 - no changes in meds today.

## 2014-04-14 NOTE — Telephone Encounter (Signed)
Relevant patient education mailed to patient.  

## 2014-04-14 NOTE — Assessment & Plan Note (Signed)
Recheck Cr today along with urine microalbumin/cr ratio. Better bp control noted.

## 2014-04-14 NOTE — Patient Instructions (Signed)
Blood pressure better. Check blood work today and urine. No changes to medicines. For foot - I think this is plantar fasciitis.  Treat with heel cushion over the counter, frozen water bottle massage, and stretching exercises provided otday.  Let me know if not improving. Return in 6-8 months for follow up.

## 2014-04-14 NOTE — Progress Notes (Signed)
Pre visit review using our clinic review tool, if applicable. No additional management support is needed unless otherwise documented below in the visit note. 

## 2014-04-14 NOTE — Progress Notes (Signed)
   BP 138/96  Pulse 66  Temp(Src) 97.8 F (36.6 C) (Oral)  Wt 187 lb (84.823 kg)  SpO2 99%   CC: HTN f/u  Subjective:    Patient ID: Shawn Vaughn, male    DOB: [birth year], 43 y.o.   MRN: [id number]  HPI: Shawn Vaughn is a 43 y.o. male presenting on 04/14/2014 for Hypertension   Ryu comes in today for 3 mo f/u HTN.  Overall feeling well.  At home bp running 100-110/60s.  With doctor, BP runs higher. Last visit blood work Cr bumped to 1.6. On rpt Cr 1.5.  Was only on toprol xl and hctz.  Fransico Michael was discontinued and amlodipine 73m was started.  Continues metoprolol succ ER 109mdaily. No HA, vision changes, CP/tightness, SOB, leg swelling.  Not on any nephrotoxin agents.  No NSAIDs. No goody powders.  No EtOH.   2 mo h/o L heel pain, worse first few steps of the day. Denies inciting trauma/falls.  BP Readings from Last 3 Encounters:  04/14/14 138/96  01/12/14 136/90  09/02/13 122/86   Lab Results  Component Value Date   CREATININE 1.5 01/12/2014    Relevant past medical, surgical, family and social history reviewed and updated as indicated.  Allergies and medications reviewed and updated. Current Outpatient Prescriptions on File Prior to Visit  Medication Sig  . OMEGA-3 KRILL OIL 300 MG CAPS Take 1 capsule by mouth daily.   No current facility-administered medications on file prior to visit.    Review of Systems Per HPI unless specifically indicated above    Objective:    BP 138/96  Pulse 66  Temp(Src) 97.8 F (36.6 C) (Oral)  Wt 187 lb (84.823 kg)  SpO2 99%  Physical Exam  Nursing note and vitals reviewed. Constitutional: He appears well-developed and well-nourished. No distress.  HENT:  Mouth/Throat: Oropharynx is clear and moist. No oropharyngeal exudate.  Cardiovascular: Normal rate, regular rhythm, normal heart sounds and intact distal pulses.   No murmur heard. Pulmonary/Chest: Effort normal and breath sounds normal. No respiratory distress. He  has no wheezes. He has no rales.  Musculoskeletal: He exhibits no edema.  Mildly tender to palpation at left medial heel (one spot) No ankle ligament laxity.  Skin: Skin is warm and dry. No rash noted.       Assessment & Plan:   Problem List Items Addressed This Visit   Pain of left heel     Anticipate plantar fasciitis - discussed treatment including frozen water bottle massage, use of heel cushion, and stretching exercises from [facility] pt advisor provided today.    HTN (hypertension) - Primary     Endorses good control at home at goal <130/80 - no changes in meds today.    Relevant Medications      metoprolol succinate (TOPROL-XL) 24 hr tablet      amLODIpine (NORVASC) tablet      fenofibrate (TRICOR) tablet   Other Relevant Orders      Microalbumin / creatinine urine ratio      Creatinine, serum   Elevated serum creatinine     Recheck Cr today along with urine microalbumin/cr ratio. Better bp control noted.        Follow up plan: Return in about 7 months (around 11/14/2014), or if symptoms worsen or fail to improve, for follow up visit.

## 2014-04-14 NOTE — Assessment & Plan Note (Signed)
Anticipate plantar fasciitis - discussed treatment including frozen water bottle massage, use of heel cushion, and stretching exercises from [facility] pt advisor provided today.

## 2014-04-19 ENCOUNTER — Encounter: Payer: Self-pay | Admitting: *Deleted

## 2014-05-30 ENCOUNTER — Other Ambulatory Visit: Payer: Self-pay | Admitting: Family Medicine

## 2014-08-27 ENCOUNTER — Other Ambulatory Visit: Payer: Self-pay | Admitting: Family Medicine

## 2014-09-03 ENCOUNTER — Encounter: Payer: BC Managed Care – PPO | Admitting: Family Medicine

## 2014-09-06 ENCOUNTER — Ambulatory Visit (INDEPENDENT_AMBULATORY_CARE_PROVIDER_SITE_OTHER): Payer: BC Managed Care – PPO | Admitting: Family Medicine

## 2014-09-06 ENCOUNTER — Encounter: Payer: Self-pay | Admitting: Family Medicine

## 2014-09-06 ENCOUNTER — Encounter: Payer: Self-pay | Admitting: *Deleted

## 2014-09-06 VITALS — BP 136/96 | HR 68 | Temp 97.8°F | Ht 67.0 in | Wt 188.5 lb

## 2014-09-06 DIAGNOSIS — I10 Essential (primary) hypertension: Secondary | ICD-10-CM

## 2014-09-06 DIAGNOSIS — Z0000 Encounter for general adult medical examination without abnormal findings: Secondary | ICD-10-CM

## 2014-09-06 DIAGNOSIS — E781 Pure hyperglyceridemia: Secondary | ICD-10-CM

## 2014-09-06 LAB — BASIC METABOLIC PANEL
BUN: 17 mg/dL (ref 6–23)
CO2: 27 meq/L (ref 19–32)
Calcium: 9.4 mg/dL (ref 8.4–10.5)
Chloride: 103 mEq/L (ref 96–112)
Creatinine, Ser: 1.5 mg/dL (ref 0.4–1.5)
GFR: 68.69 mL/min (ref >60.00)
GLUCOSE: 104 mg/dL — AB (ref 70–99)
POTASSIUM: 4.1 meq/L (ref 3.5–5.1)
SODIUM: 139 meq/L (ref 135–145)

## 2014-09-06 LAB — LIPID PANEL
CHOLESTEROL: 134 mg/dL (ref 0–200)
HDL: 24.4 mg/dL — ABNORMAL LOW (ref >39.00)
LDL CALC: 79 mg/dL (ref 0–99)
NonHDL: 109.6
Total CHOL/HDL Ratio: 5
Triglycerides: 155 mg/dL — ABNORMAL HIGH (ref 0.0–149.0)
VLDL: 31 mg/dL (ref 0.0–40.0)

## 2014-09-06 NOTE — Assessment & Plan Note (Signed)
Chronic, stable. Continue current regimen. Elevated this morning - pt did not take am meds as he's fasting.

## 2014-09-06 NOTE — Progress Notes (Signed)
BP 136/96  Pulse 68  Temp(Src) 97.8 F (36.6 C) (Oral)  Ht 5' 7"  (1.702 m)  Wt 188 lb 8 oz (85.503 kg)  BMI 29.52 kg/m2   CC: CPE  Subjective:    Patient ID: Shawn Vaughn, male    DOB: [birth year], 43 y.o.   MRN: [id number]  HPI: Shawn Vaughn is a 43 y.o. male presenting on 09/06/2014 for Annual Exam   Wt Readings from Last 3 Encounters:  09/06/14 188 lb 8 oz (85.503 kg)  04/14/14 187 lb (84.823 kg)  01/12/14 186 lb 4 oz (84.482 kg)   Body mass index is 29.52 kg/(m^2).  Seat belt use discussed.  No suspicious lesions on skin that he's aware of.   Preventative: fmhx prostate cancer 19s. Tdap today. Declines flu shot.  Caffeine: occasional sodas  Lives with mom, and dad but has his own place, 4 dogs  Occupation: works at [employer]  Activity: hunts with dogs 3x/wk Diet: good water, fruits/vegetables daily   Relevant past medical, surgical, family and social history reviewed and updated as indicated.  Allergies and medications reviewed and updated. Current Outpatient Prescriptions on File Prior to Visit  Medication Sig  . amLODipine (NORVASC) 5 MG tablet Take 1 tablet (5 mg total) by mouth daily.  . fenofibrate (TRICOR) 145 MG tablet TAKE 1 TABLET (145 MG TOTAL) BY MOUTH DAILY.  . metoprolol succinate (TOPROL-XL) 100 MG 24 hr tablet TAKE 1 TABLET (100 MG TOTAL) BY MOUTH DAILY.   No current facility-administered medications on file prior to visit.    Review of Systems  Constitutional: Negative for fever, chills, activity change, appetite change, fatigue and unexpected weight change.  HENT: Negative for hearing loss.   Eyes: Negative for visual disturbance.  Respiratory: Negative for cough, chest tightness, shortness of breath and wheezing.   Cardiovascular: Negative for chest pain, palpitations and leg swelling.  Gastrointestinal: Negative for nausea, vomiting, abdominal pain, diarrhea, constipation, blood in stool and abdominal distention.    Genitourinary: Negative for hematuria and difficulty urinating.  Musculoskeletal: Negative for arthralgias, myalgias and neck pain.  Skin: Negative for rash.  Neurological: Negative for dizziness, seizures, syncope and headaches.  Hematological: Negative for adenopathy. Does not bruise/bleed easily.  Psychiatric/Behavioral: Negative for dysphoric mood. The patient is not nervous/anxious.    Per HPI unless specifically indicated above    Objective:    BP 136/96  Pulse 68  Temp(Src) 97.8 F (36.6 C) (Oral)  Ht 5' 7"  (1.702 m)  Wt 188 lb 8 oz (85.503 kg)  BMI 29.52 kg/m2  Physical Exam  Nursing note and vitals reviewed. Constitutional: He is oriented to person, place, and time. He appears well-developed and well-nourished. No distress.  HENT:  Head: Normocephalic and atraumatic.  Right Ear: Hearing, tympanic membrane, external ear and ear canal normal.  Left Ear: Hearing, tympanic membrane, external ear and ear canal normal.  Nose: Nose normal.  Mouth/Throat: Uvula is midline, oropharynx is clear and moist and mucous membranes are normal. No oropharyngeal exudate, posterior oropharyngeal edema or posterior oropharyngeal erythema.  Eyes: Conjunctivae and EOM are normal. Pupils are equal, round, and reactive to light. No scleral icterus.  Neck: Normal range of motion. Neck supple. Carotid bruit is not present. No thyromegaly present.  Cardiovascular: Normal rate, regular rhythm, normal heart sounds and intact distal pulses.   No murmur heard. Pulses:      Radial pulses are 2+ on the right side, and 2+ on the left side.  Pulmonary/Chest: Effort normal  and breath sounds normal. No respiratory distress. He has no wheezes. He has no rales.  Abdominal: Soft. Bowel sounds are normal. He exhibits no distension and no mass. There is no tenderness. There is no rebound and no guarding.  Musculoskeletal: Normal range of motion. He exhibits no edema.  Lymphadenopathy:    He has no cervical  adenopathy.  Neurological: He is alert and oriented to person, place, and time.  CN grossly intact, station and gait intact  Skin: Skin is warm and dry. No rash noted.  Psychiatric: He has a normal mood and affect. His behavior is normal. Judgment and thought content normal.   Results for orders placed in visit on 04/14/14  MICROALBUMIN / CREATININE URINE RATIO      Result Value Ref Range   Microalb, Ur 0.2  0.0 - 1.9 mg/dL   Creatinine,U 199.6     Microalb Creat Ratio 0.1  0.0 - 30.0 mg/g  CREATININE, SERUM      Result Value Ref Range   Creatinine, Ser 1.5  0.4 - 1.5 mg/dL      Assessment & Plan:   Problem List Items Addressed This Visit   Hypertriglyceridemia     Chronic, stable. Doing well with tricor. Check FLP today.    Relevant Orders      Lipid panel   HTN (hypertension)     Chronic, stable. Continue current regimen. Elevated this morning - pt did not take am meds as he's fasting.    Relevant Orders      Basic metabolic panel   Healthcare maintenance - Primary     Preventative protocols reviewed and updated unless pt declined. Discussed healthy diet and lifestyle.  Declines flu shot.        Follow up plan: No Follow-up on file.

## 2014-09-06 NOTE — Patient Instructions (Signed)
Good to see you today, call us with questions. Return in 6 months for follow up visit.

## 2014-09-06 NOTE — Assessment & Plan Note (Signed)
Chronic, stable. Doing well with tricor. Check FLP today.

## 2014-09-06 NOTE — Progress Notes (Signed)
Pre visit review using our clinic review tool, if applicable. No additional management support is needed unless otherwise documented below in the visit note. 

## 2014-09-06 NOTE — Assessment & Plan Note (Signed)
Preventative protocols reviewed and updated unless pt declined. Discussed healthy diet and lifestyle.  Declines flu shot.

## 2014-11-22 ENCOUNTER — Other Ambulatory Visit: Payer: Self-pay | Admitting: Family Medicine

## 2015-02-28 ENCOUNTER — Encounter: Payer: Self-pay | Admitting: Family Medicine

## 2015-02-28 ENCOUNTER — Ambulatory Visit (INDEPENDENT_AMBULATORY_CARE_PROVIDER_SITE_OTHER): Payer: BLUE CROSS/BLUE SHIELD | Admitting: Family Medicine

## 2015-02-28 VITALS — BP 133/80 | HR 72 | Temp 98.0°F | Wt 184.5 lb

## 2015-02-28 DIAGNOSIS — I10 Essential (primary) hypertension: Secondary | ICD-10-CM | POA: Diagnosis not present

## 2015-02-28 DIAGNOSIS — E781 Pure hyperglyceridemia: Secondary | ICD-10-CM | POA: Diagnosis not present

## 2015-02-28 NOTE — Assessment & Plan Note (Addendum)
Chronic, stable. Continue tricor.

## 2015-02-28 NOTE — Assessment & Plan Note (Addendum)
Chronic, stable. Continue regimen. Elevated today, however at home well controlled. No changes indicated. Pt will bring bp cuff to next office visit to compare to our readings.

## 2015-02-28 NOTE — Progress Notes (Signed)
Pre visit review using our clinic review tool, if applicable. No additional management support is needed unless otherwise documented below in the visit note. 

## 2015-02-28 NOTE — Progress Notes (Signed)
BP 133/80 mmHg  Pulse 72  Temp(Src) 98 F (36.7 C) (Oral)  Wt 184 lb 8 oz (83.689 kg)   CC: 6 mo f/u visit Subjective:    Patient ID: Shawn Vaughn, male    DOB: 25-Jan-1971, 44 y.o.   MRN: [id number]  HPI: Shawn Vaughn is a 44 y.o. male presenting on 02/28/2015 for Follow-up   HTN - Compliant with current antihypertensive regimen of amlodipine 21m daily and metoprolol 1079mXL.  On recheck bp 150/90 bilaterally. However, he does check blood pressures at home: brings log showing 113-133/70-80s.  No low blood pressure readings or symptoms of dizziness/syncope.  Denies HA, vision changes, CP/tightness, SOB, leg swelling.   HLD - compliant with tricor. No myalgias.  Relevant past medical, surgical, family and social history reviewed and updated as indicated. Interim medical history since our last visit reviewed. Allergies and medications reviewed and updated. Current Outpatient Prescriptions on File Prior to Visit  Medication Sig  . amLODipine (NORVASC) 5 MG tablet Take 1 tablet (5 mg total) by mouth daily.  . fenofibrate (TRICOR) 145 MG tablet TAKE 1 TABLET (145 MG TOTAL) BY MOUTH DAILY.  . metoprolol succinate (TOPROL-XL) 100 MG 24 hr tablet TAKE 1 TABLET (100 MG TOTAL) BY MOUTH DAILY.   No current facility-administered medications on file prior to visit.    Review of Systems Per HPI unless specifically indicated above     Objective:    BP 133/80 mmHg  Pulse 72  Temp(Src) 98 F (36.7 C) (Oral)  Wt 184 lb 8 oz (83.689 kg)  Wt Readings from Last 3 Encounters:  02/28/15 184 lb 8 oz (83.689 kg)  09/06/14 188 lb 8 oz (85.503 kg)  04/14/14 187 lb (84.823 kg)    Physical Exam  Constitutional: He appears well-developed and well-nourished. No distress.  HENT:  Mouth/Throat: Oropharynx is clear and moist. No oropharyngeal exudate.  Eyes: Conjunctivae and EOM are normal. Pupils are equal, round, and reactive to light. No scleral icterus.  Neck: Normal range of motion.  Neck supple. No thyromegaly present.  Cardiovascular: Normal rate, regular rhythm, normal heart sounds and intact distal pulses.   No murmur heard. Pulmonary/Chest: Effort normal and breath sounds normal. No respiratory distress. He has no wheezes. He has no rales.  Musculoskeletal: He exhibits no edema.  Skin: Skin is warm and dry. No rash noted.  Psychiatric: He has a normal mood and affect.  Nursing note and vitals reviewed.  Results for orders placed or performed in visit on 09/06/14  Lipid panel  Result Value Ref Range   Cholesterol 134 0 - 200 mg/dL   Triglycerides 155.0 (H) 0.0 - 149.0 mg/dL   HDL 24.40 (L) >39.00 mg/dL   VLDL 31.0 0.0 - 40.0 mg/dL   LDL Cholesterol 79 0 - 99 mg/dL   Total CHOL/HDL Ratio 5    NonHDL 10253.66 Basic metabolic panel  Result Value Ref Range   Sodium 139 135 - 145 mEq/L   Potassium 4.1 3.5 - 5.1 mEq/L   Chloride 103 96 - 112 mEq/L   CO2 27 19 - 32 mEq/L   Glucose, Bld 104 (H) 70 - 99 mg/dL   BUN 17 6 - 23 mg/dL   Creatinine, Ser 1.5 0.4 - 1.5 mg/dL   Calcium 9.4 8.4 - 10.5 mg/dL   GFR 68.69 >60.00 mL/min      Assessment & Plan:   Problem List Items Addressed This Visit    Hypertriglyceridemia    Chronic, stable. Continue  tricor.      HTN (hypertension) - Primary    Chronic, stable. Continue regimen. Elevated today, however at home well controlled. No changes indicated. Pt will bring bp cuff to next office visit to compare to our readings.           Follow up plan: Return in about 6 months (around 08/30/2015), or if symptoms worsen or fail to improve, for annual exam, prior fasting for blood work.

## 2015-02-28 NOTE — Patient Instructions (Signed)
You are doing well today. Bring blood pressure cuff to next visit. Return in 6 months for physical, prior fasting for blood work.

## 2015-03-07 ENCOUNTER — Ambulatory Visit: Payer: BC Managed Care – PPO | Admitting: Family Medicine

## 2015-05-20 DIAGNOSIS — A6920 Lyme disease, unspecified: Secondary | ICD-10-CM

## 2015-05-20 HISTORY — DX: Lyme disease, unspecified: A69.20

## 2015-05-21 ENCOUNTER — Emergency Department
Admission: EM | Admit: 2015-05-21 | Discharge: 2015-05-21 | Disposition: A | Discharge status: Home / Self Care | Payer: BLUE CROSS/BLUE SHIELD | Attending: Emergency Medicine | Admitting: Emergency Medicine

## 2015-05-21 DIAGNOSIS — W57XXXA Bitten or stung by nonvenomous insect and other nonvenomous arthropods, initial encounter: Secondary | ICD-10-CM | POA: Insufficient documentation

## 2015-05-21 DIAGNOSIS — Z79899 Other long term (current) drug therapy: Secondary | ICD-10-CM | POA: Insufficient documentation

## 2015-05-21 DIAGNOSIS — Y998 Other external cause status: Secondary | ICD-10-CM | POA: Diagnosis not present

## 2015-05-21 DIAGNOSIS — Y9389 Activity, other specified: Secondary | ICD-10-CM | POA: Diagnosis not present

## 2015-05-21 DIAGNOSIS — Y9289 Other specified places as the place of occurrence of the external cause: Secondary | ICD-10-CM | POA: Diagnosis not present

## 2015-05-21 DIAGNOSIS — I10 Essential (primary) hypertension: Secondary | ICD-10-CM | POA: Diagnosis not present

## 2015-05-21 DIAGNOSIS — A6920 Lyme disease, unspecified: Secondary | ICD-10-CM | POA: Diagnosis not present

## 2015-05-21 DIAGNOSIS — S30860A Insect bite (nonvenomous) of lower back and pelvis, initial encounter: Secondary | ICD-10-CM | POA: Diagnosis not present

## 2015-05-21 MED ORDER — DOXYCYCLINE HYCLATE 100 MG PO TABS: 100.0000 mg | ORAL_TABLET | Freq: Two times a day (BID) | ORAL | Status: DC

## 2015-05-21 MED ORDER — DOXYCYCLINE HYCLATE 100 MG PO TABS
ORAL_TABLET | ORAL | Status: AC
  Filled 2015-05-21: qty 1

## 2015-05-21 MED ORDER — DOXYCYCLINE HYCLATE 100 MG PO TABS
100.0000 mg | ORAL_TABLET | Freq: Once | ORAL | Status: AC
  Administered 2015-05-21: 100 mg via ORAL

## 2015-05-21 NOTE — ED Notes (Signed)
Pt states noticied tick on right lateral buttock 2 weeks ago. Pt with redness noted around area. Pt denies fever, chills.

## 2015-05-21 NOTE — Discharge Instructions (Signed)
Lyme Disease You may have been bitten by a tick and are to watch for the development of Lyme Disease. Lyme Disease is an infection that is caused by a bacteria The bacteria causing this disease is named Borreilia burgdorferi. If a tick is infected with this bacteria and then bites you, then Lyme Disease may occur. These ticks are carried by deer and rodents such as rabbits and mice and infest grassy as well as forested areas. Fortunately most tick bites do not cause Lyme Disease.  Lyme Disease is easier to prevent than to treat. First, covering your legs with clothing when walking in areas where ticks are possibly abundant will prevent their attachment because ticks tend to stay within inches of the ground. Second, using insecticides containing DEET can be applied on skin or clothing. Last, because it takes about 12 to 24 hours for the tick to transmit the disease after attachment to the human host, you should inspect your body for ticks twice a day when you are in areas where Lyme Disease is common. You must look thoroughly when searching for ticks. The Ixodes tick that carries Lyme Disease is very small. It is around the size of a sesame seed (picture of tick is not actual size). Removal is best done by grasping the tick by the head and pulling it out. Do not to squeeze the body of the tick. This could inject the infecting bacteria into the bite site. Wash the area of the bite with an antiseptic solution after removal.  Lyme Disease is a disease that may affect many body systems. Because of the small size of the biting tick, most people do not notice being bitten. The first sign of an infection is usually a round red rash that extends out from the center of the tick bite. The center of the lesion may be blood colored (hemorrhagic) or have tiny blisters (vesicular). Most lesions have bright red outer borders and partial central clearing. This rash may extend out many inches in diameter, and multiple lesions may  be present. Other symptoms such as fatigue, headaches, chills and fever, general achiness and swelling of lymph glands may also occur. If this first stage of the disease is left untreated, these symptoms may gradually resolve by themselves, or progressive symptoms may occur because of spread of infection to other areas of the body.  Follow up with your caregiver to have testing and treatment if you have a tick bite and you develop any of the above complaints. Your caregiver may recommend preventative (prophylactic) medications which kill bacteria (antibiotics). Once a diagnosis of Lyme Disease is made, antibiotic treatment is highly likely to cure the disease. Effective treatment of late stage Lyme Disease may require longer courses of antibiotic therapy.  MAKE SURE YOU:   Understand these instructions.  Will watch your condition.  Will get help right away if you are not doing well or get worse. Document Released: 02/11/2001 Document Revised: 01/28/2012 Document Reviewed: 04/15/2009 [facility] Patient Information 2015 [place], [place]. This information is not intended to replace advice given to you by your health care provider. Make sure you discuss any questions you have with your health care provider.  Tick Bite Information Ticks are insects that attach themselves to the skin and draw blood for food. There are various types of ticks. Common types include wood ticks and deer ticks. Most ticks live in shrubs and grassy areas. Ticks can climb onto your body when you make contact with leaves or grass where the  tick is waiting. The most common places on the body for ticks to attach themselves are the scalp, neck, armpits, waist, and groin. Most tick bites are harmless, but sometimes ticks carry germs that cause diseases. These germs can be spread to a person during the tick's feeding process. The chance of a disease spreading through a tick bite depends on:   The type of tick.  Time of year.   How  long the tick is attached.   Geographic location.  HOW CAN YOU PREVENT TICK BITES? Take these steps to help prevent tick bites when you are outdoors:  Wear protective clothing. Long sleeves and long pants are best.   Wear white clothes so you can see ticks more easily.  Tuck your pant legs into your socks.   If walking on a trail, stay in the middle of the trail to avoid brushing against bushes.  Avoid walking through areas with long grass.  Put insect repellent on all exposed skin and along boot tops, pant legs, and sleeve cuffs.   Check clothing, hair, and skin repeatedly and before going inside.   Brush off any ticks that are not attached.  Take a shower or bath as soon as possible after being outdoors.  WHAT IS THE PROPER WAY TO REMOVE A TICK? Ticks should be removed as soon as possible to help prevent diseases caused by tick bites. 1. If latex gloves are available, put them on before trying to remove a tick.  2. Using fine-point tweezers, grasp the tick as close to the skin as possible. You may also use curved forceps or a tick removal tool. Grasp the tick as close to its head as possible. Avoid grasping the tick on its body. 3. Pull gently with steady upward pressure until the tick lets go. Do not twist the tick or jerk it suddenly. This may break off the tick's head or mouth parts. 4. Do not squeeze or crush the tick's body. This could force disease-carrying fluids from the tick into your body.  5. After the tick is removed, wash the bite area and your hands with soap and water or other disinfectant such as alcohol. 6. Apply a small amount of antiseptic cream or ointment to the bite site.  7. Wash and disinfect any instruments that were used.  Do not try to remove a tick by applying a hot match, petroleum jelly, or fingernail polish to the tick. These methods do not work and may increase the chances of disease being spread from the tick bite.  WHEN SHOULD YOU SEEK  MEDICAL CARE? Contact your health care provider if you are unable to remove a tick from your skin or if a part of the tick breaks off and is stuck in the skin.  After a tick bite, you need to be aware of signs and symptoms that could be related to diseases spread by ticks. Contact your health care provider if you develop any of the following in the days or weeks after the tick bite:  Unexplained fever.  Rash. A circular rash that appears days or weeks after the tick bite may indicate the possibility of Lyme disease. The rash may resemble a target with a bull's-eye and may occur at a different part of your body than the tick bite.  Redness and swelling in the area of the tick bite.   Tender, swollen lymph glands.   Diarrhea.   Weight loss.   Cough.   Fatigue.   Muscle, joint, or  bone pain.   Abdominal pain.   Headache.   Lethargy or a change in your level of consciousness.  Difficulty walking or moving your legs.   Numbness in the legs.   Paralysis.  Shortness of breath.   Confusion.   Repeated vomiting.  Document Released: 11/02/2000 Document Revised: 08/26/2013 Document Reviewed: 04/15/2013 [facility] Patient Information 2015 [place], [place]. This information is not intended to replace advice given to you by your health care provider. Make sure you discuss any questions you have with your health care provider.  Take the prescription antibiotic as directed until completely gone.  Follow-up with Dr. Darnell Level as needed.

## 2015-05-21 NOTE — ED Notes (Signed)
Pt reports tick bit on his right buttock 2 weeks ago and no he has a rash around the site that is red and raised. Pt states he has had some discomfort today but no real pain. Pt alert & oriented with NAD.

## 2015-05-21 NOTE — ED Notes (Signed)
Pt discharged home after verbalizing understanding of discharge instructions; nad noted. 

## 2015-05-22 ENCOUNTER — Encounter: Payer: Self-pay | Admitting: Family Medicine

## 2015-05-22 NOTE — ED Provider Notes (Signed)
[facility] Emergency Department Provider Note ____________________________________________  Time seen: 2246  I have reviewed the triage vital signs and the nursing notes.  HISTORY  Chief Complaint  Insect Bite  HPI Shawn Vaughn is a 44 y.o. male who reports to the ED for evaluation management of a rash that he noted about 2 weeks ago that has continued to become red and raised. He describes that he initially pulled a tick off of that same area about 4 weeks ago. He denies any interim fever, chills, sweats, arthralgias, or myalgias.  Past Medical History  Diagnosis Date  . Hypertension   . History of pneumonia 2002    staph  . Hypertriglyceridemia   . History of chicken pox     Patient Active Problem List   Diagnosis Date Noted  . Elevated serum creatinine 01/12/2014  . Healthcare maintenance 09/02/2013  . Abnormal alkaline phosphatase test 09/02/2013  . Hypertriglyceridemia   . History of pneumonia   . HTN (hypertension) 06/22/2012    Past Surgical History  Procedure Laterality Date  . US echocardiography  2002    WNL per paper chart, EF >55%    Current Outpatient Rx  Name  Route  Sig  Dispense  Refill  . amLODipine (NORVASC) 5 MG tablet   Oral   Take 1 tablet (5 mg total) by mouth daily.   90 tablet   3   . doxycycline (VIBRA-TABS) 100 MG tablet   Oral   Take 1 tablet (100 mg total) by mouth 2 (two) times daily.   20 tablet   0   . fenofibrate (TRICOR) 145 MG tablet      TAKE 1 TABLET (145 MG TOTAL) BY MOUTH DAILY.   90 tablet   3   . metoprolol succinate (TOPROL-XL) 100 MG 24 hr tablet      TAKE 1 TABLET (100 MG TOTAL) BY MOUTH DAILY.   90 tablet   3    Allergies Ibuprofen  Family History  Problem Relation Age of Onset  . Hypertension Mother   . Hyperlipidemia Neg Hx   . Diabetes Neg Hx   . Coronary artery disease Neg Hx   . Stroke Neg Hx   . Cancer Maternal Grandfather 35    prostate   Social History History   Substance Use Topics  . Smoking status: Never Smoker   . Smokeless tobacco: Never Used  . Alcohol Use: No   Review of Systems  Constitutional: Negative for fever. Eyes: Negative for visual changes. ENT: Negative for sore throat. Cardiovascular: Negative for chest pain. Respiratory: Negative for shortness of breath. Gastrointestinal: Negative for abdominal pain, vomiting and diarrhea. Genitourinary: Negative for dysuria. Musculoskeletal: Negative for back pain. Skin: Positive for rash. Neurological: Negative for headaches, focal weakness or numbness. ____________________________________________  PHYSICAL EXAM:  VITAL SIGNS: ED Triage Vitals  Enc Vitals Group     BP 05/21/15 2139 144/81 mmHg     Pulse Rate 05/21/15 2139 79     Resp 05/21/15 2139 16     Temp 05/21/15 2139 98.9 F (37.2 C)     Temp Source 05/21/15 2139 Oral     SpO2 05/21/15 2139 98 %     Weight 05/21/15 2139 182 lb (82.555 kg)     Height 05/21/15 2139 5' 7"  (1.702 m)     Head Cir --      Peak Flow --      Pain Score --      Pain Loc --  Pain Edu? --      Excl. in [place]? --    Constitutional: Alert and oriented. Well appearing and in no distress. Eyes: Conjunctivae are normal. PERRL. Normal extraocular movements. ENT   Head: Normocephalic and atraumatic.   Nose: No congestion/rhinnorhea.   Mouth/Throat: Mucous membranes are moist.   Neck: Supple. No thyromegaly. Hematological/Lymphatic/Immunilogical: No cervical lymphadenopathy. Cardiovascular: Normal rate, regular rhythm.  Respiratory: Normal respiratory effort. No wheezes/rales/rhonchi. Gastrointestinal: Soft and nontender. No distention. Musculoskeletal: Nontender with normal range of motion in all extremities.  Neurologic:  Normal gait without ataxia. Normal speech and language. No gross focal neurologic deficits are appreciated. Skin:  Skin is warm, dry and intact. Erythematous, papular rash over the right buttock, with central  clearing, overlying a small,healed papule identified as a previous tick bite.  Psychiatric: Mood and affect are normal. Patient exhibits appropriate insight and judgment. ____________________________________________  PROCEDURES  Doxycycline 100 mg PO ____________________________________________  INITIAL IMPRESSION / ASSESSMENT AND PLAN / ED COURSE  EM rash consistent with Lyme Disease.  Information about tick disease given to the patient. Prescription Doxy BID x 10 days given.  Follow-up with Dr. Darnell Level as needed. ____________________________________________  FINAL CLINICAL IMPRESSION(S) / ED DIAGNOSES  Final diagnoses:  Tick bite of buttock, initial encounter  Acute Lyme disease with largest skin lesion of 2 inches or more     Melvenia Needles, PA-C 05/22/15 0023  Ahmed Prima, MD 05/22/15 [phone]

## 2015-05-27 ENCOUNTER — Encounter: Payer: Self-pay | Admitting: Family Medicine

## 2015-05-27 ENCOUNTER — Ambulatory Visit (INDEPENDENT_AMBULATORY_CARE_PROVIDER_SITE_OTHER): Payer: BLUE CROSS/BLUE SHIELD | Admitting: Family Medicine

## 2015-05-27 VITALS — BP 124/72 | HR 68 | Temp 97.9°F | Wt 186.5 lb

## 2015-05-27 DIAGNOSIS — A6920 Lyme disease, unspecified: Secondary | ICD-10-CM

## 2015-05-27 NOTE — Progress Notes (Signed)
Pre visit review using our clinic review tool, if applicable. No additional management support is needed unless otherwise documented below in the visit note. 

## 2015-05-27 NOTE — Progress Notes (Signed)
   BP 124/72 mmHg  Pulse 68  Temp(Src) 97.9 F (36.6 C) (Oral)  Wt 186 lb 8 oz (84.596 kg)   CC: ER f/u visit  Subjective:    Patient ID: Shawn Vaughn, male    DOB: 12/02/1970, 44 y.o.   MRN: [id number]  HPI: Chaun Uemura is a 44 y.o. male presenting on 05/27/2015 for Follow-up   Records reviewed. Seen at ER 05/22/2015 for buttock tick bite sustained around 05/09/2015, concern for erythema migrans around tick bite, but no other symptoms. Treated appropriately with doxycycline, started on 10 d course.   No pain or itching around rash. No fevers/chills, abd pain, arthralgias, headaches. Overall feeling well.  He does work outdoors all day - and has noticed mild headache while on doxy. Taking doxy otherwise notices stomach upset.  Relevant past medical, surgical, family and social history reviewed and updated as indicated. Interim medical history since our last visit reviewed. Allergies and medications reviewed and updated. Current Outpatient Prescriptions on File Prior to Visit  Medication Sig  . amLODipine (NORVASC) 5 MG tablet Take 1 tablet (5 mg total) by mouth daily.  Marland Kitchen doxycycline (VIBRA-TABS) 100 MG tablet Take 1 tablet (100 mg total) by mouth 2 (two) times daily.  . fenofibrate (TRICOR) 145 MG tablet TAKE 1 TABLET (145 MG TOTAL) BY MOUTH DAILY.  . metoprolol succinate (TOPROL-XL) 100 MG 24 hr tablet TAKE 1 TABLET (100 MG TOTAL) BY MOUTH DAILY.   No current facility-administered medications on file prior to visit.    Review of Systems Per HPI unless specifically indicated above     Objective:    BP 124/72 mmHg  Pulse 68  Temp(Src) 97.9 F (36.6 C) (Oral)  Wt 186 lb 8 oz (84.596 kg)  Wt Readings from Last 3 Encounters:  05/27/15 186 lb 8 oz (84.596 kg)  05/21/15 182 lb (82.555 kg)  02/28/15 184 lb 8 oz (83.689 kg)    Physical Exam  Constitutional: He appears well-developed and well-nourished. No distress.  Skin: Skin is warm and dry. No rash noted.  Resolving  tick bite. No erythema or surrounding rash around tick bite.  Nursing note and vitals reviewed.      Assessment & Plan:   Problem List Items Addressed This Visit    Lyme disease - Primary    Recently treated for lyme disease with concern for EM rash around tick bite. No evidence of EM today, but he has been on doxy for last 5 days. Discussed photosensitivity with med, he's tolerated well while at work but provided with letter for Mon/Tues of next week asking for pt to have more frequent breaks to cool down while finishes doxy course. Reassured should continue to heal well.          Follow up plan: Return if symptoms worsen or fail to improve.

## 2015-05-27 NOTE — Patient Instructions (Addendum)
Rash is clearing up. Finish antibiotic and you should do well.

## 2015-05-27 NOTE — Assessment & Plan Note (Signed)
Recently treated for lyme disease with concern for EM rash around tick bite. No evidence of EM today, but he has been on doxy for last 5 days. Discussed photosensitivity with med, he's tolerated well while at work but provided with letter for Mon/Tues of next week asking for pt to have more frequent breaks to cool down while finishes doxy course. Reassured should continue to heal well.

## 2015-06-22 ENCOUNTER — Encounter: Payer: Self-pay | Admitting: Internal Medicine

## 2015-06-22 ENCOUNTER — Ambulatory Visit (INDEPENDENT_AMBULATORY_CARE_PROVIDER_SITE_OTHER): Payer: BLUE CROSS/BLUE SHIELD | Admitting: Internal Medicine

## 2015-06-22 VITALS — BP 140/80 | HR 80 | Temp 98.5°F | Wt 185.5 lb

## 2015-06-22 DIAGNOSIS — R1011 Right upper quadrant pain: Secondary | ICD-10-CM | POA: Diagnosis not present

## 2015-06-22 LAB — COMPREHENSIVE METABOLIC PANEL
ALBUMIN: 4.5 g/dL (ref 3.5–5.2)
ALT: 31 U/L (ref 0–53)
AST: 27 U/L (ref 0–37)
Alkaline Phosphatase: 43 U/L (ref 39–117)
BILIRUBIN TOTAL: 0.6 mg/dL (ref 0.2–1.2)
BUN: 18 mg/dL (ref 6–23)
CO2: 31 meq/L (ref 19–32)
Calcium: 10 mg/dL (ref 8.4–10.5)
Chloride: 102 mEq/L (ref 96–112)
Creatinine, Ser: 1.58 mg/dL — ABNORMAL HIGH (ref 0.40–1.50)
GFR: 61.98 mL/min (ref >60.00)
Glucose, Bld: 101 mg/dL — ABNORMAL HIGH (ref 70–99)
POTASSIUM: 3.6 meq/L (ref 3.5–5.1)
Sodium: 140 mEq/L (ref 135–145)
TOTAL PROTEIN: 8 g/dL (ref 6.0–8.3)

## 2015-06-22 LAB — CBC
HCT: 45 % (ref 39.0–52.0)
HEMOGLOBIN: 15.3 g/dL (ref 13.0–17.0)
MCHC: 34.1 g/dL (ref 30.0–36.0)
MCV: 88 fl (ref 78.0–100.0)
Platelets: 336 10*3/uL (ref 150.0–400.0)
RBC: 5.11 Mil/uL (ref 4.22–5.81)
RDW: 13.9 % (ref 11.5–15.5)
WBC: 6.8 10*3/uL (ref 4.0–10.5)

## 2015-06-22 NOTE — Patient Instructions (Signed)

## 2015-06-22 NOTE — Progress Notes (Signed)
Pre visit review using our clinic review tool, if applicable. No additional management support is needed unless otherwise documented below in the visit note. 

## 2015-06-22 NOTE — Progress Notes (Signed)
Subjective:    Patient ID: Shawn Vaughn, male    DOB: 08-Jul-1971, 44 y.o.   MRN: [id number]  HPI  Pt presents to the clinic today with upper abdominal pain. This started yesterday. He describes the pain as sharp and crampy. The pain radiates to the right flank/back area. He does feel like it is worse with movement but he does not think it is muscle. He denies nausea, vomiting or diarrhea. His last BM was this morning, he denies blood in his stool. He denies urinary symptoms. He has some heartburn occasionally but reports this feels different. He has not taken anything OTC for it.  Review of Systems      Past Medical History  Diagnosis Date  . Hypertension   . History of pneumonia 2002    staph  . Hypertriglyceridemia   . History of chicken pox   . Lyme disease 05/2015    Current Outpatient Prescriptions  Medication Sig Dispense Refill  . amLODipine (NORVASC) 5 MG tablet Take 1 tablet (5 mg total) by mouth daily. 90 tablet 3  . fenofibrate (TRICOR) 145 MG tablet TAKE 1 TABLET (145 MG TOTAL) BY MOUTH DAILY. 90 tablet 3  . metoprolol succinate (TOPROL-XL) 100 MG 24 hr tablet TAKE 1 TABLET (100 MG TOTAL) BY MOUTH DAILY. 90 tablet 3   No current facility-administered medications for this visit.    Allergies  Allergen Reactions  . Ibuprofen     Blood in stool    Family History  Problem Relation Age of Onset  . Hypertension Mother   . Hyperlipidemia Neg Hx   . Diabetes Neg Hx   . Coronary artery disease Neg Hx   . Stroke Neg Hx   . Cancer Maternal Grandfather 59    prostate    History   Social History  . Marital Status: Single    Spouse Name: N/A  . Number of Children: N/A  . Years of Education: N/A   Occupational History  . Not on file.   Social History Main Topics  . Smoking status: Never Smoker   . Smokeless tobacco: Never Used  . Alcohol Use: No  . Drug Use: No  . Sexual Activity: Not on file   Other Topics Concern  . Not on file   Social History  Narrative   Caffeine: occasional sodas   Lives with mom, and dad but has his own place, 4 dogs   Occupation: works at [employer]   Activity: hunts 3x/wk   Diet: good water, fruits/vegetables daily     Constitutional: Denies fever, malaise, fatigue, headache or abrupt weight changes.  Respiratory: Denies difficulty breathing, shortness of breath, cough or sputum production.   Cardiovascular: Denies chest pain, chest tightness, palpitations or swelling in the hands or feet.  Gastrointestinal: Pt reports abdominal pain. Denies  bloating, constipation, diarrhea or blood in the stool.  GU: Denies urgency, frequency, pain with urination, burning sensation, blood in urine, odor or discharge.  No other specific complaints in a complete review of systems (except as listed in HPI above).  Objective:   Physical Exam  BP 140/80 mmHg  Pulse 80  Temp(Src) 98.5 F (36.9 C) (Oral)  Wt 185 lb 8 oz (84.142 kg) Wt Readings from Last 3 Encounters:  06/22/15 185 lb 8 oz (84.142 kg)  05/27/15 186 lb 8 oz (84.596 kg)  05/21/15 182 lb (82.555 kg)    General: Appears his stated age, well developed, well nourished in NAD. Cardiovascular: Normal  rate and rhythm. S1,S2 noted.  No murmur, rubs or gallops noted. Pulmonary/Chest: Normal effort and positive vesicular breath sounds. No respiratory distress. No wheezes, rales or ronchi noted.  Abdomen: Soft and nontender. Normal bowel sounds, no bruits noted. No distention or masses noted. Liver, spleen and kidneys non palpable. Negative Murphy's sign. No CVA tenderness noted.   BMET    Component Value Date/Time   NA 139 09/06/2014 0821   K 4.1 09/06/2014 0821   CL 103 09/06/2014 0821   CO2 27 09/06/2014 0821   GLUCOSE 104* 09/06/2014 0821   BUN 17 09/06/2014 0821   CREATININE 1.5 09/06/2014 0821   CREATININE 1.17 06/22/2012 1256   CALCIUM 9.4 09/06/2014 0821   GFRNONAA 91 07/28/2007 0000   GFRAA 110 07/28/2007 0000    Lipid Panel       Component Value Date/Time   CHOL 134 09/06/2014 0821   TRIG 155.0* 09/06/2014 0821   HDL 24.40* 09/06/2014 0821   CHOLHDL 5 09/06/2014 0821   VLDL 31.0 09/06/2014 0821   LDLCALC 79 09/06/2014 0821    CBC    Component Value Date/Time   WBC 7.0 07/28/2007 0000   RBC 5.21 07/28/2007 0000   HGB 15.9 07/28/2007 0000   HCT 44.4 07/28/2007 0000   PLT 271 07/28/2007 0000   MCV 85.3 07/28/2007 0000   MCHC 35.8 07/28/2007 0000   RDW 11.9 07/28/2007 0000   MONOABS 0.5 07/28/2007 0000   EOSABS 0.1 07/28/2007 0000   BASOSABS 0.0 07/28/2007 0000    Hgb A1C No results found for: HGBA1C       Assessment & Plan:   RUQ abdominal pain:  Will check CBC and CMET today Tylenol as needed for pain If pain persists or worsens, consider abdominal ultrasound Work note provided  Will follow up after labs, RTC as needed

## 2015-06-23 ENCOUNTER — Other Ambulatory Visit: Payer: Self-pay | Admitting: Internal Medicine

## 2015-06-23 DIAGNOSIS — R1011 Right upper quadrant pain: Secondary | ICD-10-CM

## 2015-06-23 MED ORDER — TRAMADOL HCL 50 MG PO TABS: 50.0000 mg | ORAL_TABLET | Freq: Three times a day (TID) | ORAL | Status: DC | PRN

## 2015-06-23 NOTE — Addendum Note (Signed)
Addended by: Lurlean Nanny on: 06/23/2015 11:05 AM   Modules accepted: Orders

## 2015-06-24 ENCOUNTER — Other Ambulatory Visit: Payer: Self-pay | Admitting: Family Medicine

## 2015-06-24 ENCOUNTER — Ambulatory Visit
Admission: RE | Admit: 2015-06-24 | Discharge: 2015-06-24 | Disposition: A | Discharge status: Home / Self Care | Payer: BLUE CROSS/BLUE SHIELD | Source: Ambulatory Visit | Attending: Internal Medicine | Admitting: Internal Medicine

## 2015-06-24 DIAGNOSIS — N1330 Unspecified hydronephrosis: Secondary | ICD-10-CM | POA: Insufficient documentation

## 2015-06-24 DIAGNOSIS — R1011 Right upper quadrant pain: Secondary | ICD-10-CM | POA: Insufficient documentation

## 2015-06-27 ENCOUNTER — Telehealth: Payer: Self-pay | Admitting: Family Medicine

## 2015-06-27 NOTE — Telephone Encounter (Signed)
Refer to result note

## 2015-06-27 NOTE — Telephone Encounter (Signed)
Pt mother requesting call back with [place] results. Pt is getting anxious waiting on results. Please advise [phone]

## 2015-06-29 ENCOUNTER — Telehealth: Payer: Self-pay

## 2015-06-29 NOTE — Telephone Encounter (Signed)
Spoke to pt's mother and she is aware as instructed

## 2015-06-29 NOTE — Telephone Encounter (Signed)
Shawn Vaughn pts mother (DPR signed) wants to know if cholesterol med could have caused fluid build up in rt kidney per [place] report and the pain pt experienced. Shawn Vaughn said pt is still pain free and pt is at work today. Shawn Vaughn request cb.

## 2015-06-29 NOTE — Telephone Encounter (Signed)
No that am I aware of. Cholesterol medications typically affect the liver, not the kidneys

## 2015-07-10 ENCOUNTER — Encounter: Payer: Self-pay | Admitting: Emergency Medicine

## 2015-07-10 ENCOUNTER — Emergency Department
Admission: EM | Admit: 2015-07-10 | Discharge: 2015-07-11 | Disposition: A | Discharge status: Home / Self Care | Payer: BLUE CROSS/BLUE SHIELD | Attending: Emergency Medicine | Admitting: Emergency Medicine

## 2015-07-10 DIAGNOSIS — R109 Unspecified abdominal pain: Secondary | ICD-10-CM

## 2015-07-10 DIAGNOSIS — N200 Calculus of kidney: Secondary | ICD-10-CM | POA: Diagnosis not present

## 2015-07-10 DIAGNOSIS — I10 Essential (primary) hypertension: Secondary | ICD-10-CM | POA: Diagnosis not present

## 2015-07-10 DIAGNOSIS — Z79899 Other long term (current) drug therapy: Secondary | ICD-10-CM | POA: Diagnosis not present

## 2015-07-10 LAB — URINALYSIS COMPLETE WITH MICROSCOPIC (ARMC ONLY)
BILIRUBIN URINE: NEGATIVE
Bacteria, UA: NONE SEEN
GLUCOSE, UA: NEGATIVE mg/dL
Ketones, ur: NEGATIVE mg/dL
LEUKOCYTES UA: NEGATIVE
NITRITE: NEGATIVE
Protein, ur: NEGATIVE mg/dL
SQUAMOUS EPITHELIAL / LPF: NONE SEEN
Specific Gravity, Urine: 1.016 (ref 1.005–1.030)
pH: 5 (ref 5.0–8.0)

## 2015-07-10 LAB — BASIC METABOLIC PANEL
ANION GAP: 11 (ref 5–15)
BUN: 21 mg/dL — AB (ref 6–20)
CALCIUM: 9.8 mg/dL (ref 8.9–10.3)
CO2: 26 mmol/L (ref 22–32)
Chloride: 102 mmol/L (ref 101–111)
Creatinine, Ser: 2.21 mg/dL — ABNORMAL HIGH (ref 0.61–1.24)
GFR calc Af Amer: 41 mL/min — ABNORMAL LOW (ref >60)
GFR, EST NON AFRICAN AMERICAN: 35 mL/min — AB (ref >60)
GLUCOSE: 98 mg/dL (ref 65–99)
POTASSIUM: 3.8 mmol/L (ref 3.5–5.1)
SODIUM: 139 mmol/L (ref 135–145)

## 2015-07-10 LAB — CBC WITH DIFFERENTIAL/PLATELET
BASOS ABS: 0.1 10*3/uL (ref 0–0.1)
BASOS PCT: 1 %
EOS ABS: 0.2 10*3/uL (ref 0–0.7)
EOS PCT: 2 %
HCT: 44.4 % (ref 40.0–52.0)
Hemoglobin: 15 g/dL (ref 13.0–18.0)
Lymphocytes Relative: 20 %
Lymphs Abs: 2.3 10*3/uL (ref 1.0–3.6)
MCH: 29.2 pg (ref 26.0–34.0)
MCHC: 33.9 g/dL (ref 32.0–36.0)
MCV: 86.1 fL (ref 80.0–100.0)
MONO ABS: 0.9 10*3/uL (ref 0.2–1.0)
Monocytes Relative: 8 %
Neutro Abs: 7.8 10*3/uL — ABNORMAL HIGH (ref 1.4–6.5)
Neutrophils Relative %: 69 %
PLATELETS: 350 10*3/uL (ref 150–440)
RBC: 5.16 MIL/uL (ref 4.40–5.90)
RDW: 13.6 % (ref 11.5–14.5)
WBC: 11.3 10*3/uL — AB (ref 3.8–10.6)

## 2015-07-10 LAB — LIPASE, BLOOD: LIPASE: 28 U/L (ref 22–51)

## 2015-07-10 NOTE — ED Notes (Addendum)
Pt c/o pain wrapping around his right abdomen to right back; dysuria; pt says he was worked up for same symptoms 2 weeks ago; US showed possibly "fluid" on his kidney; [place] was performed here; no UA ordered that date; denies N/V/D; last BM was today, normal; but pt says he feels like he can have another but can't; says lab results showed some abnormal kidney function; pt says he was prescribed pain medication and took one pill 1 hour pta; cannot name pain medication

## 2015-07-10 NOTE — ED Notes (Signed)
Pt reports increased urinary frequency and burning sensation.

## 2015-07-11 ENCOUNTER — Emergency Department: Payer: BLUE CROSS/BLUE SHIELD

## 2015-07-11 MED ORDER — SODIUM CHLORIDE 0.9 % IV BOLUS (SEPSIS)
1000.0000 mL | Freq: Once | INTRAVENOUS | Status: AC
  Administered 2015-07-11: 1000 mL via INTRAVENOUS

## 2015-07-11 MED ORDER — TAMSULOSIN HCL 0.4 MG PO CAPS: 0.4000 mg | ORAL_CAPSULE | Freq: Every day | ORAL | Status: DC

## 2015-07-11 NOTE — Discharge Instructions (Signed)

## 2015-07-11 NOTE — ED Provider Notes (Signed)
[facility] Emergency Department Provider Note  ____________________________________________  Time seen: Approximately 2329 PM  I have reviewed the triage vital signs and the nursing notes.   HISTORY  Chief Complaint Flank Pain    HPI Shawn Vaughn is a 44 y.o. male with right sided abdominal and to right flank pain. The patient reports that this pain started yesterday. He reports that he also had an episode 2 weeks ago and received an ultrasound where they found fluid around his kidneys. The patient reports that the pain returned to normal but worsened yesterday morning. He reports that the pain continued and was not improving. The patient took a pain pill but decided to come in because he's not sure what the pain was. The patient reports that currently his pain is a 5 out of 10 is a did improve after that pain medication. The patient is not noticing any blood in his urine no fevers no vomiting but he did have some nausea. The patient denies any testicle pain. The patient has had some urgency and feels as though he has to urinate a lot. The patient has never had a kidney stone in the past but was concerned that he maybe had an infection so he decided to come in for evaluation.   Past Medical History  Diagnosis Date  . Hypertension   . History of pneumonia 2002    staph  . Hypertriglyceridemia   . History of chicken pox   . Lyme disease 05/2015    Patient Active Problem List   Diagnosis Date Noted  . Lyme disease 05/20/2015  . Elevated serum creatinine 01/12/2014  . Healthcare maintenance 09/02/2013  . Abnormal alkaline phosphatase test 09/02/2013  . Hypertriglyceridemia   . History of pneumonia   . HTN (hypertension) 06/22/2012    Past Surgical History  Procedure Laterality Date  . US echocardiography  2002    WNL per paper chart, EF >55%    Current Outpatient Rx  Name  Route  Sig  Dispense  Refill  . amLODipine (NORVASC) 5 MG tablet   Oral    Take 1 tablet (5 mg total) by mouth daily.   90 tablet   3   . fenofibrate (TRICOR) 145 MG tablet      TAKE 1 TABLET (145 MG TOTAL) BY MOUTH DAILY.   90 tablet   3   . metoprolol succinate (TOPROL-XL) 100 MG 24 hr tablet      TAKE 1 TABLET (100 MG TOTAL) BY MOUTH DAILY.   90 tablet   2   . traMADol (ULTRAM) 50 MG tablet   Oral   Take 1 tablet (50 mg total) by mouth every 8 (eight) hours as needed.   30 tablet   0   . tamsulosin (FLOMAX) 0.4 MG CAPS capsule   Oral   Take 1 capsule (0.4 mg total) by mouth daily.   7 capsule   0     Allergies Ibuprofen  Family History  Problem Relation Age of Onset  . Hypertension Mother   . Hyperlipidemia Neg Hx   . Diabetes Neg Hx   . Coronary artery disease Neg Hx   . Stroke Neg Hx   . Cancer Maternal Grandfather 41    prostate    Social History Social History  Substance Use Topics  . Smoking status: Never Smoker   . Smokeless tobacco: Never Used  . Alcohol Use: No    Review of Systems Constitutional: No fever/chills Eyes: No visual changes.  ENT: No sore throat. Cardiovascular: Denies chest pain. Respiratory: Denies shortness of breath. Gastrointestinal: abdominal pain and flank pain with nausea, no vomiting.  No diarrhea.  No constipation. Genitourinary: Urgency Musculoskeletal: Right Flank pain Skin: Negative for rash. Neurological: Negative for headaches, focal weakness or numbness. 10-point ROS otherwise negative.  ____________________________________________   PHYSICAL EXAM:  VITAL SIGNS: ED Triage Vitals  Enc Vitals Group     BP 07/10/15 1917 137/93 mmHg     Pulse Rate 07/10/15 1917 92     Resp 07/10/15 1917 18     Temp --      Temp src --      SpO2 07/10/15 1917 100 %     Weight 07/10/15 1917 183 lb (83.008 kg)     Height 07/10/15 1917 5' 7"  (1.702 m)     Head Cir --      Peak Flow --      Pain Score 07/10/15 1918 8     Pain Loc --      Pain Edu? --      Excl. in [place]? --      Constitutional: Alert and oriented. Well appearing and in mild distress. Eyes: Conjunctivae are normal. PERRL. EOMI. Head: Atraumatic. Nose: No congestion/rhinnorhea. Mouth/Throat: Mucous membranes are moist.  Oropharynx non-erythematous. Cardiovascular: Normal rate, regular rhythm. Grossly normal heart sounds.  Good peripheral circulation. Respiratory: Normal respiratory effort.  No retractions. Lungs CTAB. Gastrointestinal: Soft and nontender. No distention. Positive bowel sounds, right sided CVA tenderness to palpation Musculoskeletal: No lower extremity tenderness nor edema.  No joint effusions. Neurologic:  Normal speech and language.  Skin:  Skin is warm, dry and intact. No rash noted. Psychiatric: Mood and affect are normal.   ____________________________________________   LABS (all labs ordered are listed, but only abnormal results are displayed)  Labs Reviewed  CBC WITH DIFFERENTIAL/PLATELET - Abnormal; Notable for the following:    WBC 11.3 (*)    Neutro Abs 7.8 (*)    All other components within normal limits  BASIC METABOLIC PANEL - Abnormal; Notable for the following:    BUN 21 (*)    Creatinine, Ser 2.21 (*)    GFR calc non Af Amer 35 (*)    GFR calc Af Amer 41 (*)    All other components within normal limits  URINALYSIS COMPLETEWITH MICROSCOPIC ([facility] ONLY) - Abnormal; Notable for the following:    Color, Urine YELLOW (*)    APPearance CLEAR (*)    Hgb urine dipstick 1+ (*)    All other components within normal limits  LIPASE, BLOOD   ____________________________________________  EKG  None ____________________________________________  RADIOLOGY  CT renal stone study: 3 mm stone in the distal right ureter with moderate proximal obstruction ____________________________________________   PROCEDURES  Procedure(s) performed: None  Critical Care performed: No  ____________________________________________   INITIAL IMPRESSION / ASSESSMENT AND  PLAN / ED COURSE  Pertinent labs & imaging results that were available during my care of the patient were reviewed by me and considered in my medical decision making (see chart for details).  This is a 44 year old male who comes in with right flank pain that initially started 2 weeks ago and returned again yesterday. The patient does not have any blood in his urine on the urinalysis but he does have an elevated creatinine and a history of fluid on his kidney. I will do a CT scan to evaluate for kidney stone as well as give the patient some normal saline. At this time  the patient is comfortable.  The patient reports that he is comfortable at this time. The patient does have a kidney stone on the right side. I will discharge the patient home with some Flomax and referral to urology for further evaluation and treatment. The patient understands this plan and verbalized agreement with the plan as stated. ____________________________________________   FINAL CLINICAL IMPRESSION(S) / ED DIAGNOSES  Final diagnoses:  Right flank pain  Kidney stone      Loney Hering, MD 07/11/15 0126

## 2015-07-20 ENCOUNTER — Encounter: Payer: Self-pay | Admitting: Obstetrics and Gynecology

## 2015-07-20 ENCOUNTER — Ambulatory Visit (INDEPENDENT_AMBULATORY_CARE_PROVIDER_SITE_OTHER): Payer: BLUE CROSS/BLUE SHIELD | Admitting: Obstetrics and Gynecology

## 2015-07-20 VITALS — BP 130/74 | HR 76 | Ht 67.0 in | Wt 182.8 lb

## 2015-07-20 DIAGNOSIS — N508 Other specified disorders of male genital organs: Secondary | ICD-10-CM | POA: Diagnosis not present

## 2015-07-20 DIAGNOSIS — N5089 Other specified disorders of the male genital organs: Secondary | ICD-10-CM

## 2015-07-20 DIAGNOSIS — N200 Calculus of kidney: Secondary | ICD-10-CM

## 2015-07-20 DIAGNOSIS — N1330 Unspecified hydronephrosis: Secondary | ICD-10-CM

## 2015-07-20 DIAGNOSIS — R7989 Other specified abnormal findings of blood chemistry: Secondary | ICD-10-CM

## 2015-07-20 DIAGNOSIS — R748 Abnormal levels of other serum enzymes: Secondary | ICD-10-CM

## 2015-07-20 LAB — MICROSCOPIC EXAMINATION
Bacteria, UA: NONE SEEN
EPITHELIAL CELLS (NON RENAL): NONE SEEN /HPF (ref 0–10)
RBC MICROSCOPIC, UA: NONE SEEN /HPF (ref 0–?)

## 2015-07-20 LAB — URINALYSIS, COMPLETE
Bilirubin, UA: NEGATIVE
GLUCOSE, UA: NEGATIVE
KETONES UA: NEGATIVE
NITRITE UA: NEGATIVE
Protein, UA: NEGATIVE
SPEC GRAV UA: 1.02 (ref 1.005–1.030)
Urobilinogen, Ur: 0.2 mg/dL (ref 0.2–1.0)
pH, UA: 6.5 (ref 5.0–7.5)

## 2015-07-20 NOTE — Progress Notes (Signed)
07/20/2015 1:24 PM   Renda Rolls 1971/03/11 [id number]  Referring provider: Ria Bush, MD [address] [place], [place] [address]  Chief Complaint  Patient presents with  . Nephrolithiasis    f/u ER x 1 week ago for 3.5 mm stone in the distal right ureter with moderate proximal    HPI: Mr. Faircloth is a 44 year old male presenting today for follow-up after being seen in the emergency department for right flank pain. He states pain had been intermittent for approximately 2 weeks. He was found to have a 3.5 mm right UVJ stone with proximal hydronephrosis and hydroureter on CT scan. Creatinine was elevated to 2.21 CBC was within normal limits. Bilateral punctate renal stones and enlargement of the prostate were also noted. He reports no previous history of renal stones. He denies any gross hematuria.  The was no microscopic hematuria found on urinalysis in the emergency department. He has experienced no pain since his visit to the emergency department or urinary symptoms. He was provided strainer but reports he has not been able to use it while working. He is not aware of any stone passage. He took tamsulosin prescribed by emergency physician 1 week but is currently out of medication.  [place]: Uncle- renal stones  PMH: Past Medical History  Diagnosis Date  . Hypertension   . History of pneumonia 2002    staph  . Hypertriglyceridemia   . History of chicken pox   . Lyme disease 05/2015  . Nephrolithiasis     Surgical History: Past Surgical History  Procedure Laterality Date  . US echocardiography  2002    WNL per paper chart, EF >55%    Home Medications:    Medication List       This list is accurate as of: 07/20/15  1:24 PM.  Always use your most recent med list.               amLODipine 5 MG tablet  Commonly known as:  NORVASC  Take 1 tablet (5 mg total) by mouth daily.     fenofibrate 145 MG tablet  Commonly known as:  TRICOR  TAKE 1 TABLET  (145 MG TOTAL) BY MOUTH DAILY.     metoprolol succinate 100 MG 24 hr tablet  Commonly known as:  TOPROL-XL  TAKE 1 TABLET (100 MG TOTAL) BY MOUTH DAILY.     tamsulosin 0.4 MG Caps capsule  Commonly known as:  FLOMAX  Take 1 capsule (0.4 mg total) by mouth daily.     traMADol 50 MG tablet  Commonly known as:  ULTRAM  Take 1 tablet (50 mg total) by mouth every 8 (eight) hours as needed.        Allergies:  Allergies  Allergen Reactions  . Ibuprofen     Blood in stool    Family History: Family History  Problem Relation Age of Onset  . Hypertension Mother   . Hyperlipidemia Neg Hx   . Diabetes Neg Hx   . Coronary artery disease Neg Hx   . Stroke Neg Hx   . Cancer Maternal Grandfather 59    prostate  . Prostate cancer Maternal Uncle   . Nephrolithiasis Paternal Uncle     Social History:  reports that he has never smoked. He has never used smokeless tobacco. He reports that he does not drink alcohol or use illicit drugs.  ROS: UROLOGY Frequent Urination?: Yes Hard to postpone urination?: No Burning/pain with urination?: Yes Get up at night to urinate?:  No Leakage of urine?: No Urine stream starts and stops?: No Trouble starting stream?: No Do you have to strain to urinate?: No Blood in urine?: No Urinary tract infection?: No Sexually transmitted disease?: No Injury to kidneys or bladder?: No Painful intercourse?: No Weak stream?: No Erection problems?: No Penile pain?: No  Gastrointestinal Nausea?: No Vomiting?: No Indigestion/heartburn?: No Diarrhea?: No Constipation?: No  Constitutional Fever: No Night sweats?: No Fatigue?: No  Skin Skin rash/lesions?: No Itching?: No  Eyes Blurred vision?: No Double vision?: No  Ears/Nose/Throat Sinus problems?: No  Hematologic/Lymphatic Swollen glands?: No Easy bruising?: No  Cardiovascular Leg swelling?: No Chest pain?: No  Respiratory Cough?: No Shortness of breath?: No  Endocrine Excessive  thirst?: No  Musculoskeletal Back pain?: No  Neurological Headaches?: No Dizziness?: No  Psychologic Depression?: No Anxiety?: No  Physical Exam: BP 130/74 mmHg  Pulse 76  Ht 5' 7"  (1.702 m)  Wt 182 lb 12.8 oz (82.918 kg)  BMI 28.62 kg/m2  Constitutional:  Alert and oriented, No acute distress. HEENT: [place] AT, moist mucus membranes.  Trachea midline, no masses. Cardiovascular: No clubbing, cyanosis, or edema. Respiratory: Normal respiratory effort, no increased work of breathing. GI: Abdomen is soft, nontender, nondistended, no abdominal masses GU: No CVA tenderness. Circumcised phallus, testes descended bilaterally approximately 1 cm round slightly tender mass palpable superior to left testicle, most consistent with an epididymal cyst Skin: No rashes, bruises or suspicious lesions. Lymph: No cervical or inguinal adenopathy. Neurologic: Grossly intact, no focal deficits, moving all 4 extremities. Psychiatric: Normal mood and affect.  Laboratory Data: Lab Results  Component Value Date   WBC 11.3* 07/10/2015   HGB 15.0 07/10/2015   HCT 44.4 07/10/2015   MCV 86.1 07/10/2015   PLT 350 07/10/2015    Lab Results  Component Value Date   CREATININE 2.21* 07/10/2015    No results found for: PSA  No results found for: TESTOSTERONE  No results found for: HGBA1C  Urinalysis    Component Value Date/Time   COLORURINE YELLOW* 07/10/2015 1923   APPEARANCEUR CLEAR* 07/10/2015 1923   LABSPEC 1.016 07/10/2015 1923   PHURINE 5.0 07/10/2015 1923   GLUCOSEU Negative 07/20/2015 0948   HGBUR 1+* 07/10/2015 1923   BILIRUBINUR Negative 07/20/2015 0948   BILIRUBINUR NEGATIVE 07/10/2015 1923   KETONESUR NEGATIVE 07/10/2015 1923   PROTEINUR NEGATIVE 07/10/2015 1923   NITRITE Negative 07/20/2015 0948   NITRITE NEGATIVE 07/10/2015 1923   LEUKOCYTESUR Trace* 07/20/2015 0948   LEUKOCYTESUR NEGATIVE 07/10/2015 1923    Pertinent Imaging:  CT images reviewed. EXAM: 07/11/15 CT  ABDOMEN AND PELVIS WITHOUT CONTRAST 3.5 mm stone in the distal right ureter just above the ureterovesical junction. There is proximal hydronephrosis and hydroureter with mild stranding around the right kidney. Additional tiny punctate nonobstructing stones in the kidneys bilaterally. No hydronephrosis or hydroureter on the left.  Assessment & Plan:   44 year old African-American male with a history of a 3.5 mm right UVJ stone seen with right-sided hydronephrosis on CT scan. Patient is currently pain-free. This was his first stone episode. Bilateral punctate stones were also seen on his CT scan. Scrotal mass noted on exam most likely benign epididymal cyst. Problem List Items Addressed This Visit      Other   Elevated serum creatinine    Other Visit Diagnoses    Nephrolithiasis    -  Primary    Relevant Orders    Urinalysis, Complete (Completed)    Ultrasound renal complete    Hydronephrosis of right kidney  Relevant Orders    Creatinine, serum    Ultrasound renal complete    Scrotal mass        Relevant Orders    US Scrotum      1. Nephrolithiasis- 3.5 mm right UVJ stone seen with right-sided hydronephrosis on CT scan. Patient is pain free and has most likely passed the stone or it resides in the bladder. Patient instructed to continue to strain urine as much as possible and push fluids. Instructed to notify the office or seek immediate medical attention for uncontrolled pain uncontrolled nausea or vomiting or fevers. Patient has bilateral punctate stones. He has opted to follow up yearly for KUBs for further monitoring of stones.Patient instructed to notify our office or seek immediate medical attention for uncontrolled pain, nausea, vomiting or fevers. We discussed general stone prevention techniques including drinking plenty water with goal of producing 2.5 L urine daily, increased citric acid intake, avoidance of high oxalate containing foods, and decreased salt intake.   Information about dietary recommendations given today.  - Urinalysis, Complete  2. Left-sided scrotal mass- Approximately 1 cm round slightly tender mass palpable superior to left testicle, most consistent with an epididymal cyst. Patient reports he believes this mass is been there for years without significant change. Scrotal ultrasound ordered for further evaluation to establish baseline.   3. Right-sided hydronephrosis- Proximal to 3.5 mm right UVJ stone. Renal ultrasound ordered to confirm resolution of hydronephrosis seen on CT.  4. Elevated serum creatinine Patient's serum creatinine was elevated to 2.21 when checked in the emergency department. This elevation is most likely caused by obstruction from right ureteral stone. Creatinine level repeated today.  Return in about 3 weeks (around 08/12/2015) for renal and scrotal US results.  Herbert Moors, [facility] [address], [place], [place] [address] [phone]

## 2015-07-21 DIAGNOSIS — N200 Calculus of kidney: Secondary | ICD-10-CM

## 2015-07-21 HISTORY — DX: Calculus of kidney: N20.0

## 2015-07-21 LAB — CREATININE, SERUM
CREATININE: 1.79 mg/dL — AB (ref 0.76–1.27)
GFR, EST AFRICAN AMERICAN: 53 mL/min/{1.73_m2} — AB (ref >59)
GFR, EST NON AFRICAN AMERICAN: 46 mL/min/{1.73_m2} — AB (ref >59)

## 2015-07-27 ENCOUNTER — Telehealth: Payer: Self-pay | Admitting: *Deleted

## 2015-07-27 NOTE — Telephone Encounter (Signed)
-----   Message from Roda Shutters, [place] sent at 07/25/2015  7:57 PM EDT ----- Please notify patient that his serum creatinine level is improving but is still elevated. He needs to make sure that he goes to get his renal ultrasound to make sure that the stone has passed and follow-up for his results. Thank you

## 2015-07-27 NOTE — Telephone Encounter (Signed)
I spoke w/the patient and relayed their lab results and emphasized the importance of the RUS.  The pt indicated understanding and had no questions.

## 2015-07-27 NOTE — Telephone Encounter (Signed)
I spoke w/the pt's father and requested a call-back from his son.  I also told the pt's father that I would try his son's mobile phone.

## 2015-08-02 ENCOUNTER — Other Ambulatory Visit: Payer: Self-pay | Admitting: Obstetrics and Gynecology

## 2015-08-02 DIAGNOSIS — N5089 Other specified disorders of the male genital organs: Secondary | ICD-10-CM

## 2015-08-02 DIAGNOSIS — N1339 Other hydronephrosis: Secondary | ICD-10-CM

## 2015-08-05 ENCOUNTER — Other Ambulatory Visit: Payer: Self-pay | Admitting: Obstetrics and Gynecology

## 2015-08-05 DIAGNOSIS — N5089 Other specified disorders of the male genital organs: Secondary | ICD-10-CM

## 2015-08-08 ENCOUNTER — Ambulatory Visit
Admission: RE | Admit: 2015-08-08 | Discharge: 2015-08-08 | Disposition: A | Discharge status: Home / Self Care | Payer: BLUE CROSS/BLUE SHIELD | Source: Ambulatory Visit | Attending: Obstetrics and Gynecology | Admitting: Obstetrics and Gynecology

## 2015-08-08 ENCOUNTER — Ambulatory Visit: Admission: RE | Admit: 2015-08-08 | Payer: BLUE CROSS/BLUE SHIELD | Source: Ambulatory Visit

## 2015-08-08 DIAGNOSIS — N503 Cyst of epididymis: Secondary | ICD-10-CM | POA: Insufficient documentation

## 2015-08-08 DIAGNOSIS — N5089 Other specified disorders of the male genital organs: Secondary | ICD-10-CM

## 2015-08-08 DIAGNOSIS — N1339 Other hydronephrosis: Secondary | ICD-10-CM

## 2015-08-08 DIAGNOSIS — N1330 Unspecified hydronephrosis: Secondary | ICD-10-CM | POA: Insufficient documentation

## 2015-08-08 DIAGNOSIS — N508 Other specified disorders of male genital organs: Secondary | ICD-10-CM | POA: Diagnosis present

## 2015-08-10 ENCOUNTER — Ambulatory Visit
Admission: RE | Admit: 2015-08-10 | Discharge: 2015-08-10 | Disposition: A | Discharge status: Home / Self Care | Payer: BLUE CROSS/BLUE SHIELD | Source: Ambulatory Visit | Attending: Obstetrics and Gynecology | Admitting: Obstetrics and Gynecology

## 2015-08-10 ENCOUNTER — Encounter: Payer: Self-pay | Admitting: Obstetrics and Gynecology

## 2015-08-10 ENCOUNTER — Ambulatory Visit (INDEPENDENT_AMBULATORY_CARE_PROVIDER_SITE_OTHER): Payer: BLUE CROSS/BLUE SHIELD | Admitting: Obstetrics and Gynecology

## 2015-08-10 ENCOUNTER — Telehealth: Payer: Self-pay | Admitting: Obstetrics and Gynecology

## 2015-08-10 VITALS — BP 152/89 | HR 75 | Ht 67.0 in | Wt 182.6 lb

## 2015-08-10 DIAGNOSIS — N201 Calculus of ureter: Secondary | ICD-10-CM | POA: Insufficient documentation

## 2015-08-10 DIAGNOSIS — N503 Cyst of epididymis: Secondary | ICD-10-CM | POA: Diagnosis not present

## 2015-08-10 DIAGNOSIS — N200 Calculus of kidney: Secondary | ICD-10-CM

## 2015-08-10 MED ORDER — TAMSULOSIN HCL 0.4 MG PO CAPS: 0.4000 mg | ORAL_CAPSULE | Freq: Every day | ORAL | Status: DC

## 2015-08-10 NOTE — Telephone Encounter (Signed)
Please notify patient that it was very difficult to see his stone on his x-ray so he is not a good candidate for lithotripsy. I would like to give him 3 more weeks to pass the stone and repeat a renal ultrasound to see if his hydronephrosis is still present. If stone has not passed at that point we will need to schedule a ureteroscopy to remove the stone.  Please order renal ultrasound and schedule a follow-up appointment for results. Thanks

## 2015-08-10 NOTE — Progress Notes (Signed)
08/10/2015 1:08 PM   Shawn Vaughn 17-Jan-1971 [id number]  Referring provider: Ria Bush, MD [address] [place], [place] [address]  Chief Complaint  Patient presents with  . Follow-up    [place] results    HPI: Patient presenting today for results of renal and scrotal ultrasound. He states that he recently began to experience intermittent mild right flank pain again. No urinary symptoms.  Previous Visit History: Shawn Vaughn is a 44 year old male presenting today for follow-up after being seen in the emergency department for right flank pain. He states pain had been intermittent for approximately 2 weeks. He was found to have a 3.5 mm right UVJ stone with proximal hydronephrosis and hydroureter on CT scan. Creatinine was elevated to 2.21 CBC was within normal limits. Bilateral punctate renal stones and enlargement of the prostate were also noted. He reports no previous history of renal stones. He denies any gross hematuria. The was no microscopic hematuria found on urinalysis in the emergency department. He has experienced no pain since his visit to the emergency department or urinary symptoms. He was provided strainer but reports he has not been able to use it while working. He is not aware of any stone passage. He took tamsulosin prescribed by emergency physician 1 week but is currently out of medication.  [place]: Uncle- renal stones  PMH: Past Medical History  Diagnosis Date  . Hypertension   . History of pneumonia 2002    staph  . Hypertriglyceridemia   . History of chicken pox   . Lyme disease 05/2015  . Nephrolithiasis     Surgical History: Past Surgical History  Procedure Laterality Date  . US echocardiography  2002    WNL per paper chart, EF >55%    Home Medications:    Medication List       This list is accurate as of: 08/10/15  1:08 PM.  Always use your most recent med list.               amLODipine 5 MG tablet  Commonly known as:  NORVASC   Take 1 tablet (5 mg total) by mouth daily.     fenofibrate 145 MG tablet  Commonly known as:  TRICOR  TAKE 1 TABLET (145 MG TOTAL) BY MOUTH DAILY.     metoprolol succinate 100 MG 24 hr tablet  Commonly known as:  TOPROL-XL  TAKE 1 TABLET (100 MG TOTAL) BY MOUTH DAILY.     tamsulosin 0.4 MG Caps capsule  Commonly known as:  FLOMAX  Take 1 capsule (0.4 mg total) by mouth daily.     traMADol 50 MG tablet  Commonly known as:  ULTRAM  Take 1 tablet (50 mg total) by mouth every 8 (eight) hours as needed.        Allergies:  Allergies  Allergen Reactions  . Ibuprofen     Blood in stool    Family History: Family History  Problem Relation Age of Onset  . Hypertension Mother   . Hyperlipidemia Neg Hx   . Diabetes Neg Hx   . Coronary artery disease Neg Hx   . Stroke Neg Hx   . Cancer Maternal Grandfather 28    prostate  . Prostate cancer Maternal Uncle   . Nephrolithiasis Paternal Uncle     Social History:  reports that he has never smoked. He has never used smokeless tobacco. He reports that he does not drink alcohol or use illicit drugs.  Physical Exam: BP 152/89 mmHg  Pulse  75  Ht 5' 7"  (1.702 m)  Wt 182 lb 9.6 oz (82.827 kg)  BMI 28.59 kg/m2  Constitutional:  Alert and oriented, No acute distress. HEENT: [place] AT, moist mucus membranes.  Trachea midline, no masses. Cardiovascular: No clubbing, cyanosis, or edema. Respiratory: Normal respiratory effort, no increased work of breathing. Skin: No rashes, bruises or suspicious lesions. Neurologic: Grossly intact, no focal deficits, moving all 4 extremities. Psychiatric: Normal mood and affect.  Laboratory Data:   Urinalysis    Component Value Date/Time   COLORURINE YELLOW* 07/10/2015 1923   APPEARANCEUR CLEAR* 07/10/2015 1923   LABSPEC 1.016 07/10/2015 1923   PHURINE 5.0 07/10/2015 1923   GLUCOSEU Negative 07/20/2015 0948   HGBUR 1+* 07/10/2015 1923   BILIRUBINUR Negative 07/20/2015 0948   BILIRUBINUR NEGATIVE  07/10/2015 1923   KETONESUR NEGATIVE 07/10/2015 1923   PROTEINUR NEGATIVE 07/10/2015 1923   NITRITE Negative 07/20/2015 0948   NITRITE NEGATIVE 07/10/2015 1923   LEUKOCYTESUR Trace* 07/20/2015 0948   LEUKOCYTESUR NEGATIVE 07/10/2015 1923    Pertinent Imaging: 08/08/15 EXAM: SCROTAL ULTRASOUND DOPPLER ULTRASOUND OF THE TESTICLES COMPARISON: None. FINDINGS: Right testicle Measurements: 4.1 x 2.3 x 2.5 cm. No mass or microlithiasis visualized. Left testicle Measurements: 3.5 x 2.3 x 2.7 cm. No mass or microlithiasis visualized. Right epididymis: Normal in size and appearance. Left epididymis: A large simple appearing cyst in the superior epididymis measures 2.5 x 1.7 x 2.7 cm. Hydrocele: None visualized. Varicocele: None visualized. Pulsed Doppler interrogation of both testes demonstrates normal low resistance arterial and venous waveforms bilaterally. IMPRESSION: Negative for testicular mass. A large but simple epididymal cyst on the left likely accounts for the palpated abnormality. CLINICAL DATA: Hydronephrosis. Scrotal mass.  EXAM: 08/08/15 RENAL / URINARY TRACT ULTRASOUND COMPLETE COMPARISON: 07/11/2015-CT abdomen FINDINGS: Right Kidney: Length: 11.9 cm. Echogenicity within normal limits. No mass. Mild-moderate hydronephrosis. Left Kidney: Length: 10.3 cm. Echogenicity within normal limits. No mass or hydronephrosis visualized. Bladder: Appears normal for degree of bladder distention. IMPRESSION: 1. Mild-moderate right hydronephrosis. 2. No left obstructive uropathy.  Assessment & Plan:    1. Right ureteral Stone- Mild to moderate right hydronephrosis seen on renal ultrasound most likley obstruction related to distal right ureteral stone seen on previous CT scan. We discussed various treatment options including ESWL vs. ureteroscopy, laser lithotripsy, and stent. We discussed the risks and benefits of both including bleeding, infection, damage to  surrounding structures, efficacy with need for possible further intervention, and need for temporary ureteral stent. Patient would like to try ESWL if possible. KUB ordered today.  2. Left Epididymal Cyst- Scrotal US Negative for testicular mass. A large but simple epididymal cyst on the left likely accounts for the palpated abnormality. No treatment or follow-up required. Patient understands that he can return as needed if cyst becomes bothersome at any point in the future.   Problem List Items Addressed This Visit    None    Visit Diagnoses    Right ureteral stone    -  Primary    Relevant Orders    Abdomen 1 view (KUB)    CULTURE, URINE COMPREHENSIVE    Epididymal cyst           There are no diagnoses linked to this encounter.  No Follow-up on file.  These notes generated with voice recognition software. I apologize for typographical errors.  Herbert Moors, [facility] [address] [place], [place] [address] [phone]

## 2015-08-11 NOTE — Telephone Encounter (Signed)
LMOM

## 2015-08-11 NOTE — Telephone Encounter (Signed)
Spoke with pt in reference to x-ray and litho. Pt voiced understanding. RUS orders placed.

## 2015-08-12 LAB — CULTURE, URINE COMPREHENSIVE

## 2015-08-15 ENCOUNTER — Other Ambulatory Visit: Payer: Self-pay | Admitting: Family Medicine

## 2015-09-05 ENCOUNTER — Ambulatory Visit
Admission: RE | Admit: 2015-09-05 | Discharge: 2015-09-05 | Disposition: A | Discharge status: Home / Self Care | Payer: BLUE CROSS/BLUE SHIELD | Source: Ambulatory Visit | Attending: Obstetrics and Gynecology | Admitting: Obstetrics and Gynecology

## 2015-09-05 DIAGNOSIS — Z87442 Personal history of urinary calculi: Secondary | ICD-10-CM | POA: Insufficient documentation

## 2015-09-05 DIAGNOSIS — N200 Calculus of kidney: Secondary | ICD-10-CM

## 2015-09-14 ENCOUNTER — Other Ambulatory Visit: Payer: Self-pay | Admitting: Family Medicine

## 2015-09-14 DIAGNOSIS — R7989 Other specified abnormal findings of blood chemistry: Secondary | ICD-10-CM

## 2015-09-14 DIAGNOSIS — E781 Pure hyperglyceridemia: Secondary | ICD-10-CM

## 2015-09-14 DIAGNOSIS — I10 Essential (primary) hypertension: Secondary | ICD-10-CM

## 2015-09-15 ENCOUNTER — Telehealth: Payer: Self-pay | Admitting: Family Medicine

## 2015-09-15 ENCOUNTER — Other Ambulatory Visit (INDEPENDENT_AMBULATORY_CARE_PROVIDER_SITE_OTHER): Payer: BLUE CROSS/BLUE SHIELD

## 2015-09-15 DIAGNOSIS — E781 Pure hyperglyceridemia: Secondary | ICD-10-CM | POA: Diagnosis not present

## 2015-09-15 DIAGNOSIS — I10 Essential (primary) hypertension: Secondary | ICD-10-CM

## 2015-09-15 LAB — COMPREHENSIVE METABOLIC PANEL
ALT: 37 U/L (ref 0–53)
AST: 28 U/L (ref 0–37)
Albumin: 4.2 g/dL (ref 3.5–5.2)
Alkaline Phosphatase: 34 U/L — ABNORMAL LOW (ref 39–117)
BUN: 18 mg/dL (ref 6–23)
CALCIUM: 9.7 mg/dL (ref 8.4–10.5)
CO2: 27 meq/L (ref 19–32)
CREATININE: 1.6 mg/dL — AB (ref 0.40–1.50)
Chloride: 104 mEq/L (ref 96–112)
GFR: 61.02 mL/min (ref >60.00)
GLUCOSE: 87 mg/dL (ref 70–99)
Potassium: 3.7 mEq/L (ref 3.5–5.1)
Sodium: 141 mEq/L (ref 135–145)
Total Bilirubin: 0.5 mg/dL (ref 0.2–1.2)
Total Protein: 7.5 g/dL (ref 6.0–8.3)

## 2015-09-15 LAB — LIPID PANEL
CHOL/HDL RATIO: 4
Cholesterol: 112 mg/dL (ref 0–200)
HDL: 29.3 mg/dL — ABNORMAL LOW (ref >39.00)
LDL CALC: 56 mg/dL (ref 0–99)
NONHDL: 82.57
TRIGLYCERIDES: 134 mg/dL (ref 0.0–149.0)
VLDL: 26.8 mg/dL (ref 0.0–40.0)

## 2015-09-15 NOTE — Telephone Encounter (Signed)
Let's put him back on at 7:30am. I think my meeting will be cancelled. If it's not we will fit him in at 12:00pm.

## 2015-09-15 NOTE — Telephone Encounter (Signed)
Pt had cpx labs today.  We had to r/s his appointment for cpx on 11/2 because dr g had meeting.  Pt wanted to know if there was any way to see him that day.  He took the day of work. Please advise.

## 2015-09-16 NOTE — Telephone Encounter (Signed)
Per dr g put pt back on @ 7:30.  Pt aware if dr g meeting is not canceled we will call him to change till 52

## 2015-09-21 ENCOUNTER — Encounter: Payer: BLUE CROSS/BLUE SHIELD | Admitting: Family Medicine

## 2015-09-21 ENCOUNTER — Encounter: Payer: Self-pay | Admitting: Family Medicine

## 2015-09-21 ENCOUNTER — Ambulatory Visit (INDEPENDENT_AMBULATORY_CARE_PROVIDER_SITE_OTHER): Payer: BLUE CROSS/BLUE SHIELD | Admitting: Family Medicine

## 2015-09-21 VITALS — BP 130/90 | HR 68 | Temp 98.3°F | Ht 67.0 in | Wt 186.2 lb

## 2015-09-21 DIAGNOSIS — E781 Pure hyperglyceridemia: Secondary | ICD-10-CM

## 2015-09-21 DIAGNOSIS — R7989 Other specified abnormal findings of blood chemistry: Secondary | ICD-10-CM

## 2015-09-21 DIAGNOSIS — I10 Essential (primary) hypertension: Secondary | ICD-10-CM

## 2015-09-21 DIAGNOSIS — Z0000 Encounter for general adult medical examination without abnormal findings: Secondary | ICD-10-CM | POA: Diagnosis not present

## 2015-09-21 DIAGNOSIS — R911 Solitary pulmonary nodule: Secondary | ICD-10-CM

## 2015-09-21 DIAGNOSIS — Z8701 Personal history of pneumonia (recurrent): Secondary | ICD-10-CM

## 2015-09-21 HISTORY — DX: Solitary pulmonary nodule: R91.1

## 2015-09-21 LAB — MICROALBUMIN / CREATININE URINE RATIO
CREATININE, U: 168.1 mg/dL
MICROALB/CREAT RATIO: 0.4 mg/g (ref 0.0–30.0)
Microalb, Ur: <0.7 mg/dL (ref 0.0–1.9)

## 2015-09-21 MED ORDER — FENOFIBRATE 145 MG PO TABS: 145.0000 mg | ORAL_TABLET | Freq: Every day | ORAL | Status: DC

## 2015-09-21 MED ORDER — AMLODIPINE BESYLATE 5 MG PO TABS: ORAL_TABLET | ORAL | Status: DC

## 2015-09-21 MED ORDER — METOPROLOL SUCCINATE ER 100 MG PO TB24: ORAL_TABLET | ORAL | Status: DC

## 2015-09-21 NOTE — Addendum Note (Signed)
Addended by: Ellamae Sia on: 09/21/2015 03:26 PM   Modules accepted: Orders

## 2015-09-21 NOTE — Assessment & Plan Note (Signed)
In h/o kidney stones. Cr stable at 1.6. Stable since 2014, but reviewing chart back in 2013 Cr 1.1. Check microalb today, discussed avoiding NSAIDs and staying well hydrated.

## 2015-09-21 NOTE — Progress Notes (Signed)
Pre visit review using our clinic review tool, if applicable. No additional management support is needed unless otherwise documented below in the visit note. 

## 2015-09-21 NOTE — Assessment & Plan Note (Signed)
No f/u needed

## 2015-09-21 NOTE — Patient Instructions (Addendum)
Urine check today for protein. You are doing well. Continue drinking plenty of water to avoid kidney stones. Continue current medicines. Return as needed or in 1 year for next physical.  Health Maintenance, Male A healthy lifestyle and preventative care can promote health and wellness.  Maintain regular health, dental, and eye exams.  Eat a healthy diet. Foods like vegetables, fruits, whole grains, low-fat dairy products, and lean protein foods contain the nutrients you need and are low in calories. Decrease your intake of foods high in solid fats, added sugars, and salt. Get information about a proper diet from your health care provider, if necessary.  Regular physical exercise is one of the most important things you can do for your health. Most adults should get at least 150 minutes of moderate-intensity exercise (any activity that increases your heart rate and causes you to sweat) each week. In addition, most adults need muscle-strengthening exercises on 2 or more days a week.   Maintain a healthy weight. The body mass index (BMI) is a screening tool to identify possible weight problems. It provides an estimate of body fat based on height and weight. Your health care provider can find your BMI and can help you achieve or maintain a healthy weight. For males 20 years and older:  A BMI below 18.5 is considered underweight.  A BMI of 18.5 to 24.9 is normal.  A BMI of 25 to 29.9 is considered overweight.  A BMI of 30 and above is considered obese.  Maintain normal blood lipids and cholesterol by exercising and minimizing your intake of saturated fat. Eat a balanced diet with plenty of fruits and vegetables. Blood tests for lipids and cholesterol should begin at age 7 and be repeated every 5 years. If your lipid or cholesterol levels are high, you are over age 41, or you are at high risk for heart disease, you may need your cholesterol levels checked more frequently.Ongoing high lipid and  cholesterol levels should be treated with medicines if diet and exercise are not working.  If you smoke, find out from your health care provider how to quit. If you do not use tobacco, do not start.  Lung cancer screening is recommended for adults aged 84-80 years who are at high risk for developing lung cancer because of a history of smoking. A yearly low-dose CT scan of the lungs is recommended for people who have at least a 30-pack-year history of smoking and are current smokers or have quit within the past 15 years. A pack year of smoking is smoking an average of 1 pack of cigarettes a day for 1 year (for example, a 30-pack-year history of smoking could mean smoking 1 pack a day for 30 years or 2 packs a day for 15 years). Yearly screening should continue until the smoker has stopped smoking for at least 15 years. Yearly screening should be stopped for people who develop a health problem that would prevent them from having lung cancer treatment.  If you choose to drink alcohol, do not have more than 2 drinks per day. One drink is considered to be 12 oz (360 mL) of beer, 5 oz (150 mL) of wine, or 1.5 oz (45 mL) of liquor.  Avoid the use of street drugs. Do not share needles with anyone. Ask for help if you need support or instructions about stopping the use of drugs.  High blood pressure causes heart disease and increases the risk of stroke. High blood pressure is more likely to  develop in:  People who have blood pressure in the end of the normal range (100-139/85-89 mm Hg).  People who are overweight or obese.  People who are African American.  If you are 96-66 years of age, have your blood pressure checked every 3-5 years. If you are 29 years of age or older, have your blood pressure checked every year. You should have your blood pressure measured twice--once when you are at a hospital or clinic, and once when you are not at a hospital or clinic. Record the average of the two measurements. To  check your blood pressure when you are not at a hospital or clinic, you can use:  An automated blood pressure machine at a pharmacy.  A home blood pressure monitor.  If you are 18-75 years old, ask your health care provider if you should take aspirin to prevent heart disease.  Diabetes screening involves taking a blood sample to check your fasting blood sugar level. This should be done once every 3 years after age 55 if you are at a normal weight and without risk factors for diabetes. Testing should be considered at a younger age or be carried out more frequently if you are overweight and have at least 1 risk factor for diabetes.  Colorectal cancer can be detected and often prevented. Most routine colorectal cancer screening begins at the age of 107 and continues through age 73. However, your health care provider may recommend screening at an earlier age if you have risk factors for colon cancer. On a yearly basis, your health care provider may provide home test kits to check for hidden blood in the stool. A small camera at the end of a tube may be used to directly examine the colon (sigmoidoscopy or colonoscopy) to detect the earliest forms of colorectal cancer. Talk to your health care provider about this at age 92 when routine screening begins. A direct exam of the colon should be repeated every 5-10 years through age 80, unless early forms of precancerous polyps or small growths are found.  People who are at an increased risk for hepatitis B should be screened for this virus. You are considered at high risk for hepatitis B if:  You were born in a country where hepatitis B occurs often. Talk with your health care provider about which countries are considered high risk.  Your parents were born in a high-risk country and you have not received a shot to protect against hepatitis B (hepatitis B vaccine).  You have HIV or AIDS.  You use needles to inject street drugs.  You live with, or have sex  with, someone who has hepatitis B.  You are a man who has sex with other men (MSM).  You get hemodialysis treatment.  You take certain medicines for conditions like cancer, organ transplantation, and autoimmune conditions.  Hepatitis C blood testing is recommended for all people born from [birth year] through 1964 and any individual with known risk factors for hepatitis C.  Healthy men should no longer receive prostate-specific antigen (PSA) blood tests as part of routine cancer screening. Talk to your health care provider about prostate cancer screening.  Testicular cancer screening is not recommended for adolescents or adult males who have no symptoms. Screening includes self-exam, a health care provider exam, and other screening tests. Consult with your health care provider about any symptoms you have or any concerns you have about testicular cancer.  Practice safe sex. Use condoms and avoid high-risk sexual practices to reduce  the spread of sexually transmitted infections (STIs).  You should be screened for STIs, including gonorrhea and chlamydia if:  You are sexually active and are younger than 24 years.  You are older than 24 years, and your health care provider tells you that you are at risk for this type of infection.  Your sexual activity has changed since you were last screened, and you are at an increased risk for chlamydia or gonorrhea. Ask your health care provider if you are at risk.  If you are at risk of being infected with HIV, it is recommended that you take a prescription medicine daily to prevent HIV infection. This is called pre-exposure prophylaxis (PrEP). You are considered at risk if:  You are a man who has sex with other men (MSM).  You are a heterosexual man who is sexually active with multiple partners.  You take drugs by injection.  You are sexually active with a partner who has HIV.  Talk with your health care provider about whether you are at high risk of being  infected with HIV. If you choose to begin PrEP, you should first be tested for HIV. You should then be tested every 3 months for as long as you are taking PrEP.  Use sunscreen. Apply sunscreen liberally and repeatedly throughout the day. You should seek shade when your shadow is shorter than you. Protect yourself by wearing long sleeves, pants, a wide-brimmed hat, and sunglasses year round whenever you are outdoors.  Tell your health care provider of new moles or changes in moles, especially if there is a change in shape or color. Also, tell your health care provider if a mole is larger than the size of a pencil eraser.  A one-time screening for abdominal aortic aneurysm (AAA) and surgical repair of large AAAs by ultrasound is recommended for men aged 58-75 years who are current or former smokers.  Stay current with your vaccines (immunizations).   This information is not intended to replace advice given to you by your health care provider. Make sure you discuss any questions you have with your health care provider.   Document Released: 05/03/2008 Document Revised: 11/26/2014 Document Reviewed: 04/02/2011 Elsevier Interactive Patient Education [employer].

## 2015-09-21 NOTE — Assessment & Plan Note (Addendum)
H/o staph PNA 2002. CT scan with fibrosis at lung bases and RLL 64m nodule.

## 2015-09-21 NOTE — Progress Notes (Signed)
BP 130/90 mmHg  Pulse 68  Temp(Src) 98.3 F (36.8 C) (Oral)  Ht 5' 7"  (1.702 m)  Wt 186 lb 4 oz (84.482 kg)  BMI 29.16 kg/m2   CC: CPE  Subjective:    Patient ID: Shawn Vaughn, male    DOB: 1971-07-23, 44 y.o.   MRN: [id number]  HPI: Shawn Vaughn is a 44 y.o. male presenting on 09/21/2015 for Annual Exam   Preventative: fmhx prostate cancer 72s. Tdap today. Declines flu shot. Seat belt use discussed.  No changing moles on skin.  Caffeine: occasional sodas  Lives with mom, and dad but has his own place, 4 dogs  Occupation: works at [employer]  Activity: hunts with dogs 3x/wk Diet: good water, fruits/vegetables daily   Relevant past medical, surgical, family and social history reviewed and updated as indicated. Interim medical history since our last visit reviewed. Allergies and medications reviewed and updated. No current outpatient prescriptions on file prior to visit.   No current facility-administered medications on file prior to visit.    Review of Systems  Constitutional: Negative for fever, chills, activity change, appetite change, fatigue and unexpected weight change.  HENT: Negative for hearing loss.   Eyes: Negative for visual disturbance.  Respiratory: Negative for cough, chest tightness, shortness of breath and wheezing.   Cardiovascular: Negative for chest pain, palpitations and leg swelling.  Gastrointestinal: Negative for nausea, vomiting, abdominal pain, diarrhea, constipation, blood in stool and abdominal distention.  Genitourinary: Negative for hematuria and difficulty urinating.  Musculoskeletal: Negative for myalgias, arthralgias and neck pain.  Skin: Negative for rash.  Neurological: Negative for dizziness, seizures, syncope and headaches.  Hematological: Negative for adenopathy. Does not bruise/bleed easily.  Psychiatric/Behavioral: Negative for dysphoric mood. The patient is not nervous/anxious.    Per HPI unless specifically  indicated in ROS section     Objective:    BP 130/90 mmHg  Pulse 68  Temp(Src) 98.3 F (36.8 C) (Oral)  Ht 5' 7"  (1.702 m)  Wt 186 lb 4 oz (84.482 kg)  BMI 29.16 kg/m2  Wt Readings from Last 3 Encounters:  09/21/15 186 lb 4 oz (84.482 kg)  08/10/15 182 lb 9.6 oz (82.827 kg)  07/20/15 182 lb 12.8 oz (82.918 kg)    Physical Exam  Constitutional: He is oriented to person, place, and time. He appears well-developed and well-nourished. No distress.  HENT:  Head: Normocephalic and atraumatic.  Right Ear: Hearing, tympanic membrane, external ear and ear canal normal.  Left Ear: Hearing, tympanic membrane, external ear and ear canal normal.  Nose: Nose normal.  Mouth/Throat: Uvula is midline, oropharynx is clear and moist and mucous membranes are normal. No oropharyngeal exudate, posterior oropharyngeal edema or posterior oropharyngeal erythema.  Eyes: Conjunctivae and EOM are normal. Pupils are equal, round, and reactive to light. No scleral icterus.  Neck: Normal range of motion. Neck supple.  Cardiovascular: Normal rate, regular rhythm, normal heart sounds and intact distal pulses.   No murmur heard. Pulses:      Radial pulses are 2+ on the right side, and 2+ on the left side.  Pulmonary/Chest: Effort normal and breath sounds normal. No respiratory distress. He has no wheezes. He has no rales.  Abdominal: Soft. Bowel sounds are normal. He exhibits no distension and no mass. There is no tenderness. There is no rebound and no guarding.  Musculoskeletal: Normal range of motion. He exhibits no edema.  Lymphadenopathy:    He has no cervical adenopathy.  Neurological: He is alert  and oriented to person, place, and time.  CN grossly intact, station and gait intact  Skin: Skin is warm and dry. No rash noted.  Psychiatric: He has a normal mood and affect. His behavior is normal. Judgment and thought content normal.  Nursing note and vitals reviewed.  Results for orders placed or performed  in visit on 09/15/15  Lipid panel  Result Value Ref Range   Cholesterol 112 0 - 200 mg/dL   Triglycerides 134.0 0.0 - 149.0 mg/dL   HDL 29.30 (L) >39.00 mg/dL   VLDL 26.8 0.0 - 40.0 mg/dL   LDL Cholesterol 56 0 - 99 mg/dL   Total CHOL/HDL Ratio 4    NonHDL 82.57   Comprehensive metabolic panel  Result Value Ref Range   Sodium 141 135 - 145 mEq/L   Potassium 3.7 3.5 - 5.1 mEq/L   Chloride 104 96 - 112 mEq/L   CO2 27 19 - 32 mEq/L   Glucose, Bld 87 70 - 99 mg/dL   BUN 18 6 - 23 mg/dL   Creatinine, Ser 1.60 (H) 0.40 - 1.50 mg/dL   Total Bilirubin 0.5 0.2 - 1.2 mg/dL   Alkaline Phosphatase 34 (L) 39 - 117 U/L   AST 28 0 - 37 U/L   ALT 37 0 - 53 U/L   Total Protein 7.5 6.0 - 8.3 g/dL   Albumin 4.2 3.5 - 5.2 g/dL   Calcium 9.7 8.4 - 10.5 mg/dL   GFR 61.02 >60.00 mL/min      Assessment & Plan:   Problem List Items Addressed This Visit    Solitary pulmonary nodule on lung CT    No f/u needed      Hypertriglyceridemia    Chronic, stable. Continue tricor.      Relevant Medications   amLODipine (NORVASC) 5 MG tablet   fenofibrate (TRICOR) 145 MG tablet   metoprolol succinate (TOPROL-XL) 100 MG 24 hr tablet   HTN (hypertension)    Chronic, stable. Continue current regimen. Consider change to ACEI.       Relevant Medications   amLODipine (NORVASC) 5 MG tablet   fenofibrate (TRICOR) 145 MG tablet   metoprolol succinate (TOPROL-XL) 100 MG 24 hr tablet   History of pneumonia    H/o staph PNA 2002. CT scan with fibrosis at lung bases and RLL 36m nodule.       Healthcare maintenance - Primary    Preventative protocols reviewed and updated unless pt declined. Discussed healthy diet and lifestyle.       Elevated serum creatinine    In h/o kidney stones. Cr stable at 1.6. Stable since 2014, but reviewing chart back in 2013 Cr 1.1. Check microalb today, discussed avoiding NSAIDs and staying well hydrated.           Follow up plan: Return in about 1 year (around  09/20/2016), or as needed, for annual exam, prior fasting for blood work.

## 2015-09-21 NOTE — Assessment & Plan Note (Signed)
Chronic, stable. Continue tricor.

## 2015-09-21 NOTE — Assessment & Plan Note (Signed)
Chronic, stable. Continue current regimen. Consider change to ACEI.

## 2015-09-21 NOTE — Assessment & Plan Note (Signed)
Preventative protocols reviewed and updated unless pt declined. Discussed healthy diet and lifestyle.  

## 2015-09-22 ENCOUNTER — Encounter: Payer: Self-pay | Admitting: *Deleted

## 2015-10-01 ENCOUNTER — Other Ambulatory Visit: Payer: Self-pay | Admitting: Obstetrics and Gynecology

## 2015-10-01 DIAGNOSIS — N200 Calculus of kidney: Secondary | ICD-10-CM

## 2015-11-21 IMAGING — US US SCROTUM
1 series · 14 of 25 positions shown · non-contrast
Comparison: None.

CLINICAL DATA: Left scrotal mass on physical examination.

EXAM:
SCROTAL ULTRASOUND
DOPPLER ULTRASOUND OF THE TESTICLES
TECHNIQUE: Complete ultrasound examination of the testicles, epididymis, and
other scrotal structures was performed. Color and spectral Doppler
ultrasound were also utilized to evaluate blood flow to the
testicles.

[Series 1: us scrotum · 0.07mm/px · 14 of 63 slices shown]
[im 1/63]
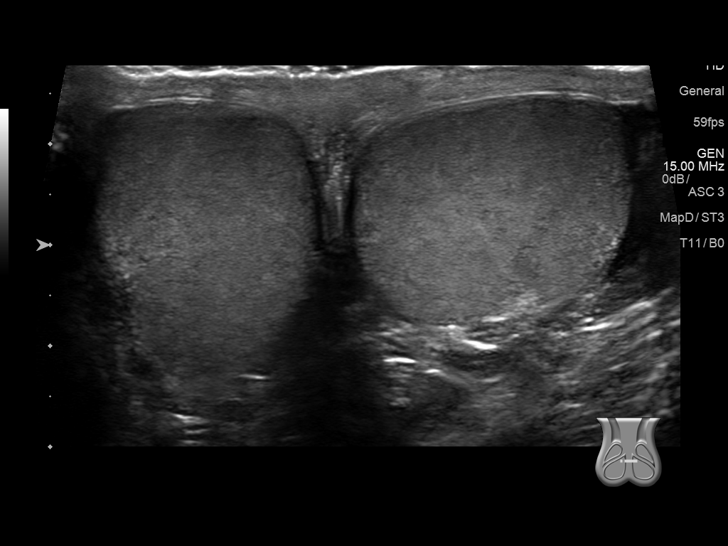
[im 6/63]
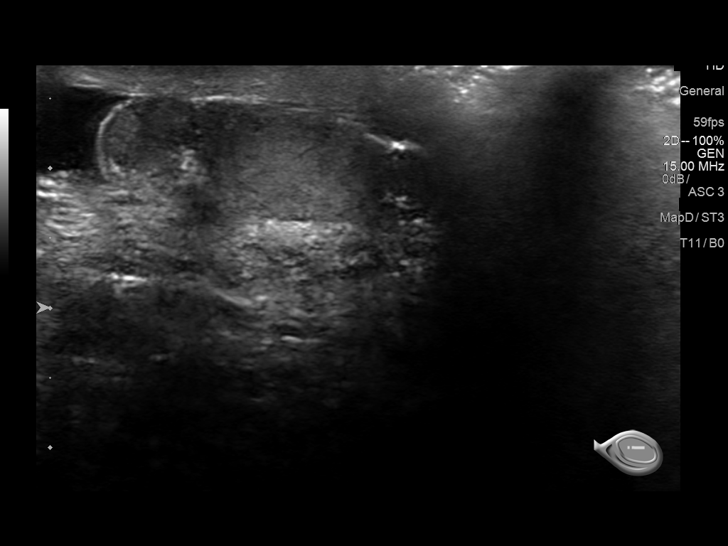
[im 11/63]
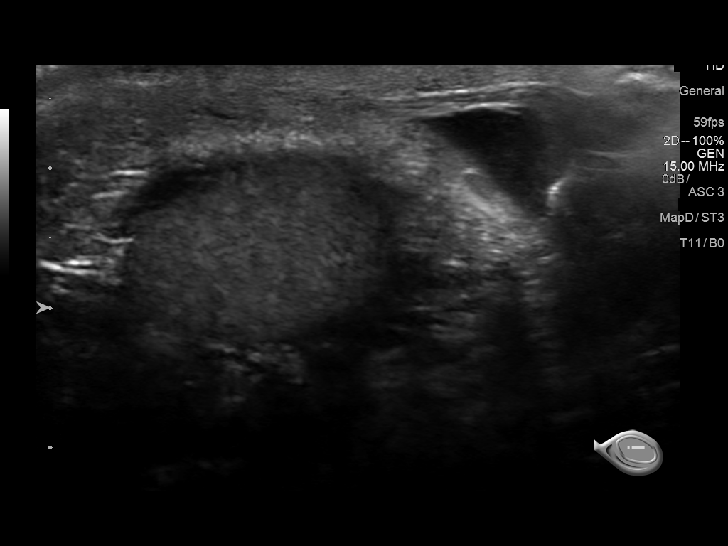
[im 16/63]
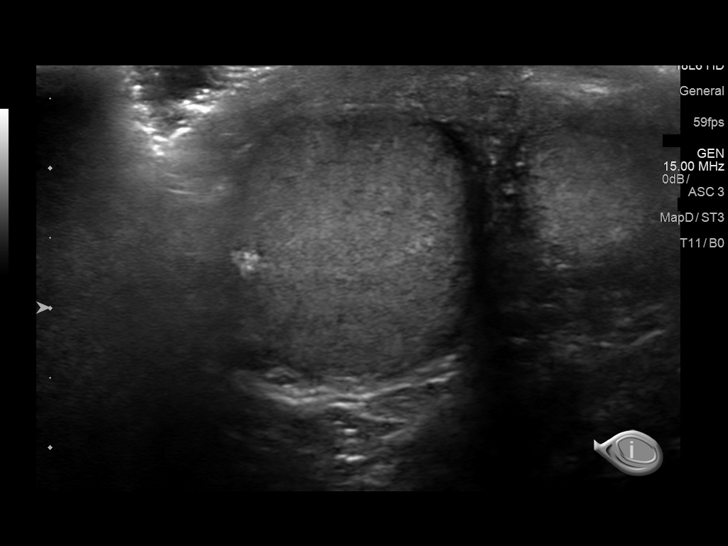
[im 21/63]
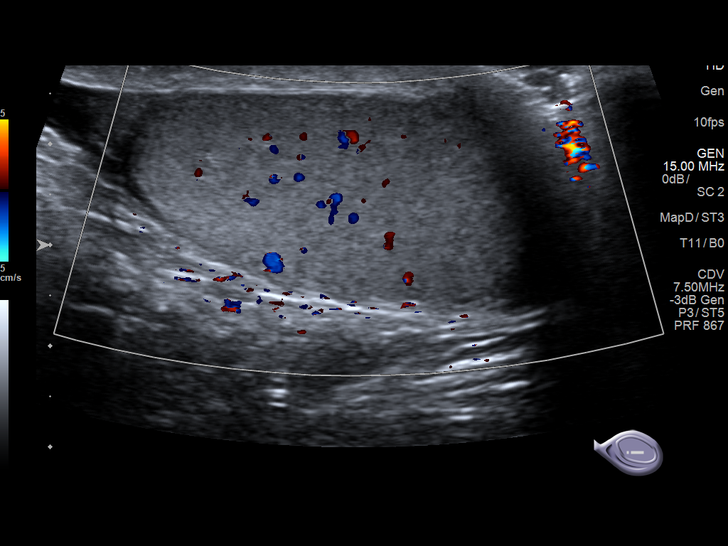
[im 24/63]
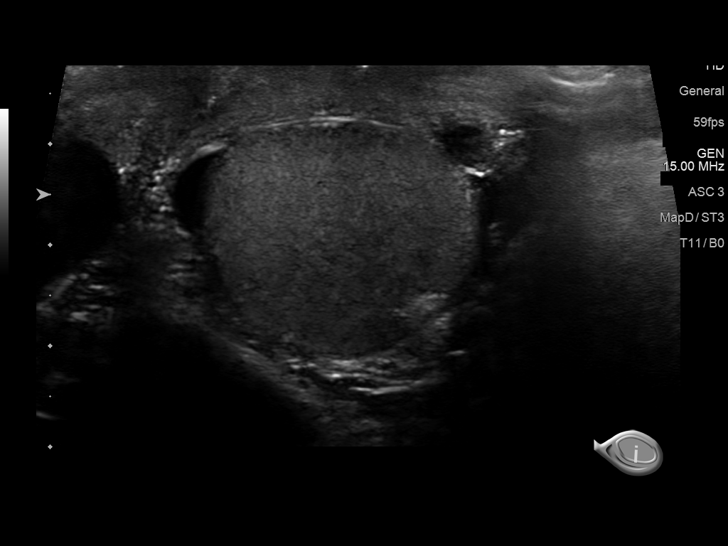
[im 29/63]
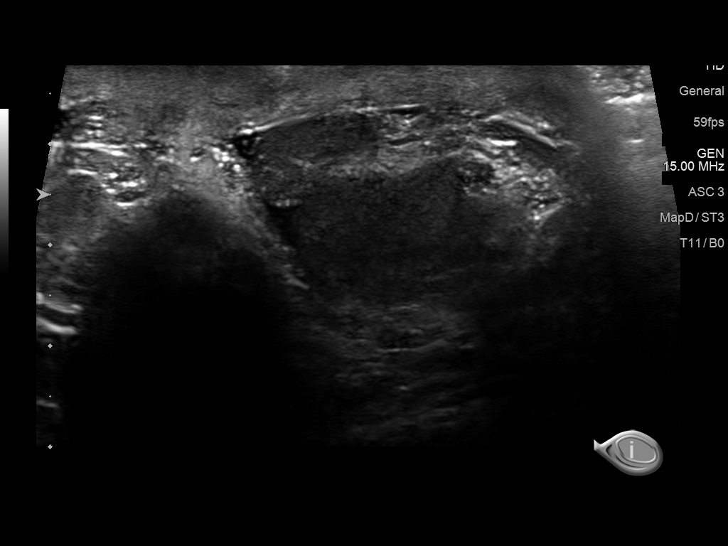
[im 34/63]
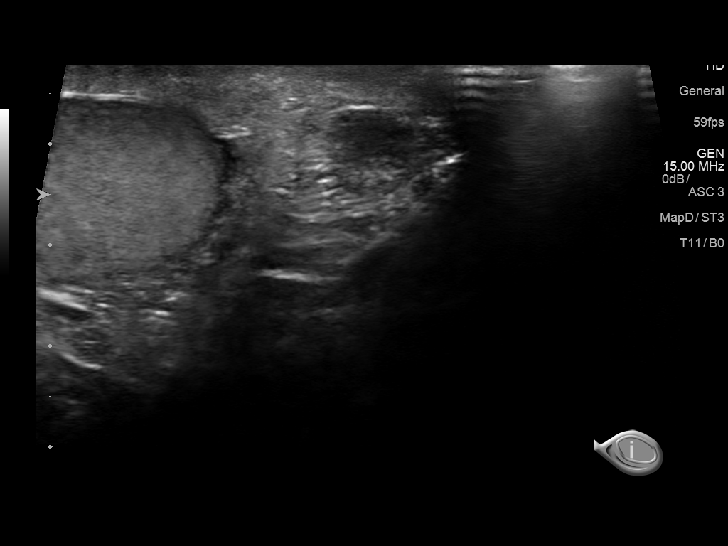
[im 39/63]
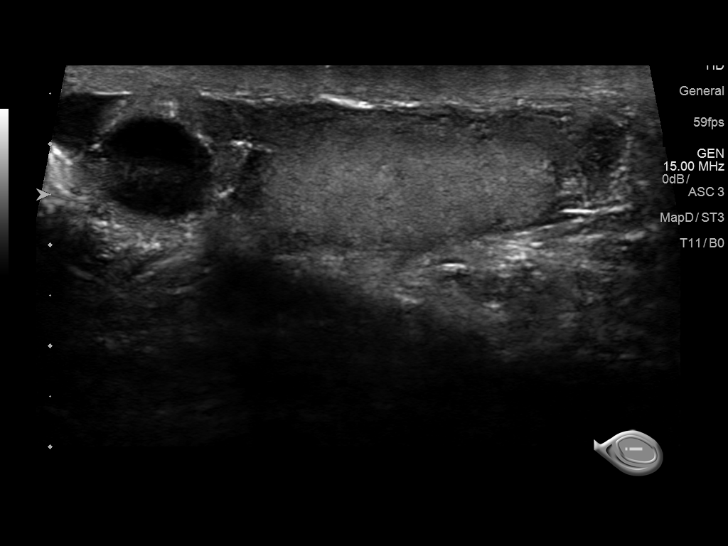
[im 42/63]
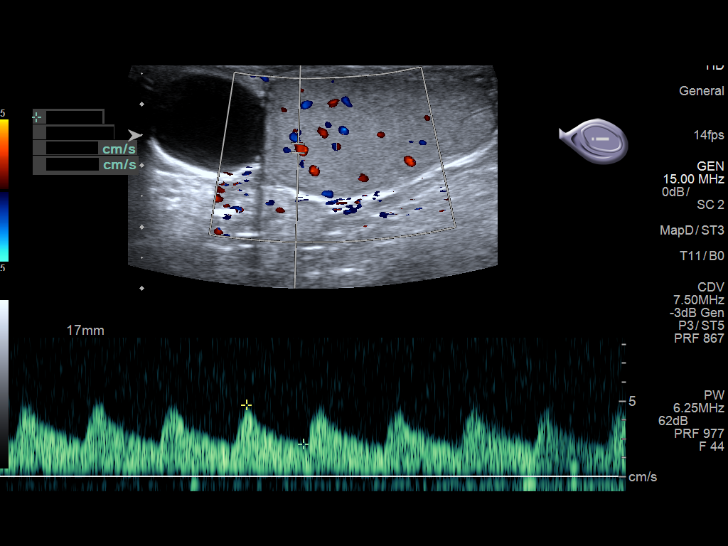
[im 47/63]
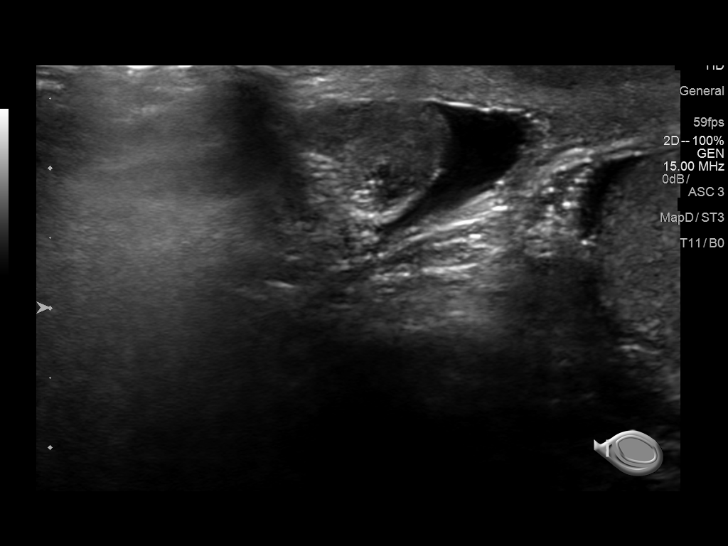
[im 52/63]
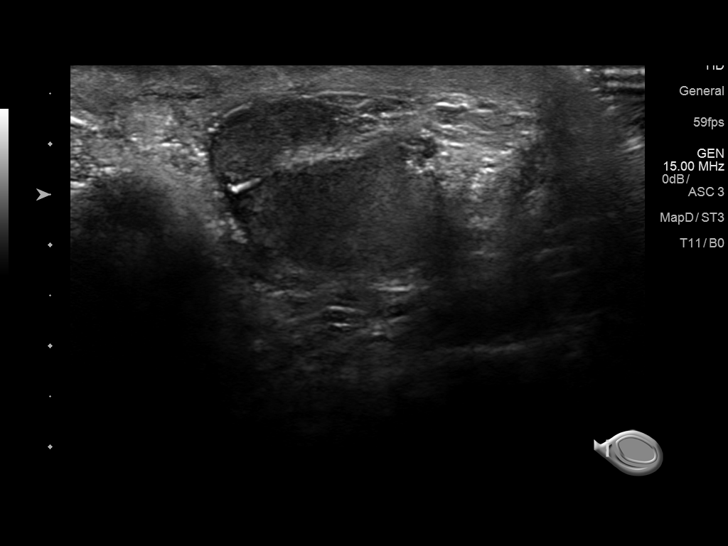
[im 57/63]
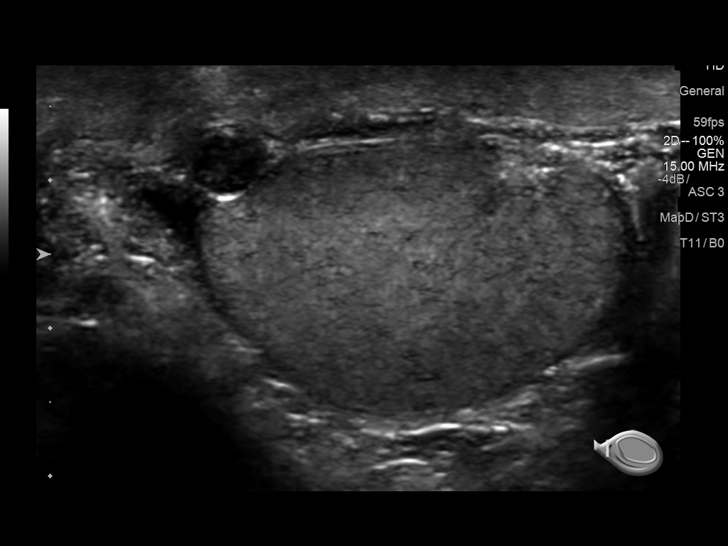
[im 63/63]
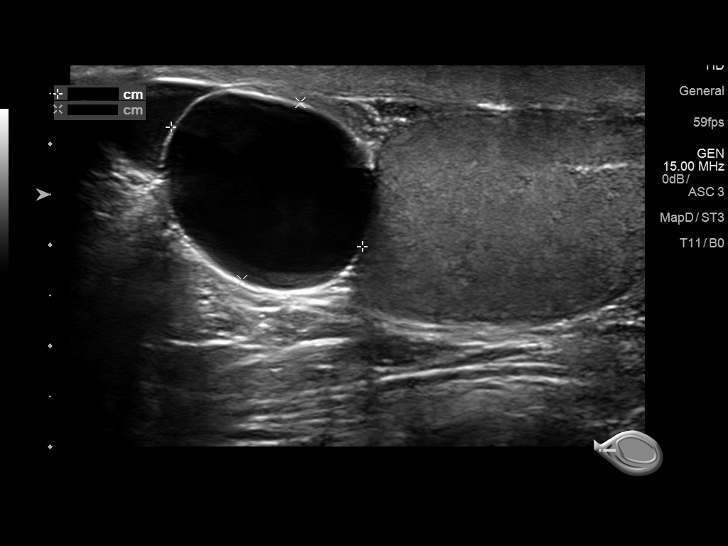

[14 of 25 positions shown; findings below may reference images not displayed]

FINDINGS: Right testicle

Measurements: 4.1 x 2.3 x 2.5 cm. No mass or microlithiasis
visualized.

Left testicle

Measurements: 3.5 x 2.3 x 2.7 cm. No mass or microlithiasis
visualized.

Right epididymis:  Normal in size and appearance.

Left epididymis: A large simple appearing cyst in the superior
epididymis measures 2.5 x 1.7 x 2.7 cm.

Hydrocele:  None visualized.

Varicocele:  None visualized.

Pulsed Doppler interrogation of both testes demonstrates normal low
resistance arterial and venous waveforms bilaterally.
IMPRESSION: Negative for testicular mass. A large but simple epididymal cyst on
the left likely accounts for the palpated abnormality.

## 2015-12-19 IMAGING — US US RENAL
1 series · 14 of 25 positions shown · non-contrast
Comparison: 08/08/2015

CLINICAL DATA: Followup kidney stones.

EXAM:
RENAL / URINARY TRACT ULTRASOUND COMPLETE

[Series 1: us renal · 0.24mm/px · 14 of 25 slices shown]
[im 1/25]
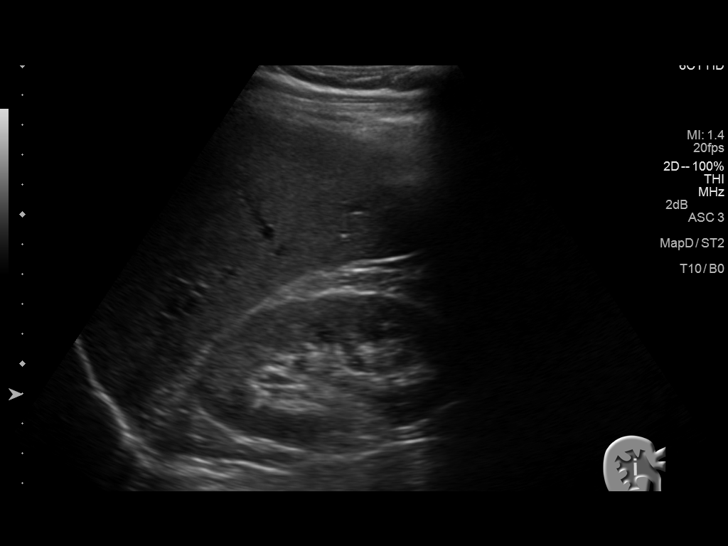
[im 3/25]
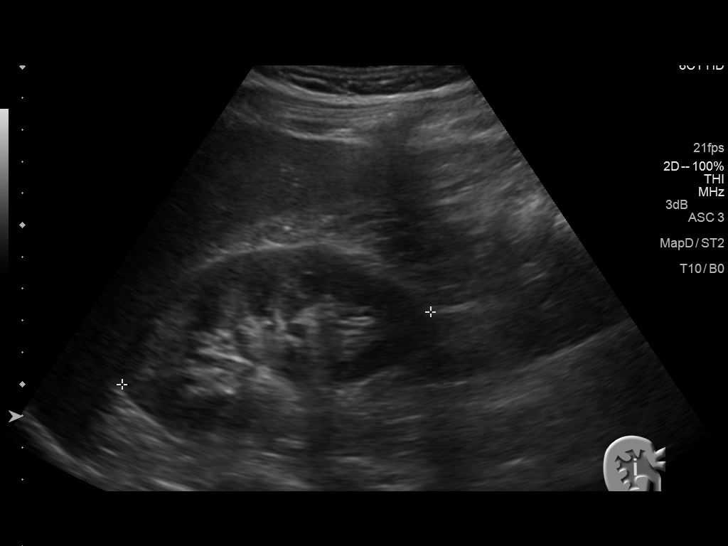
[im 5/25]
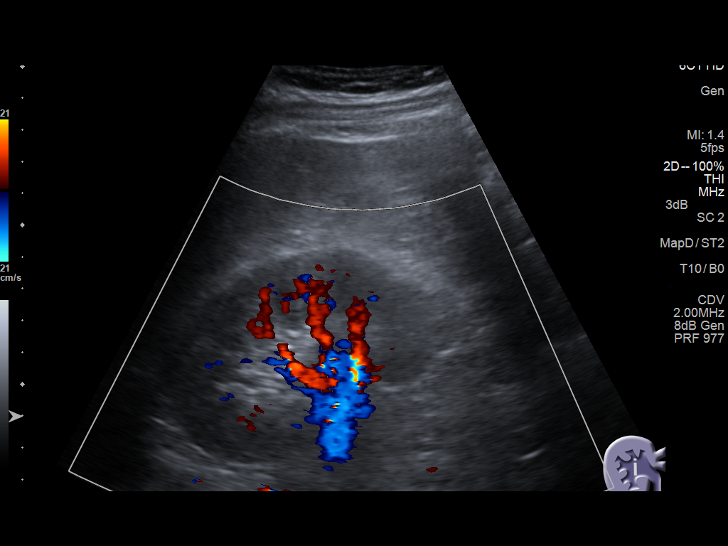
[im 7/25]
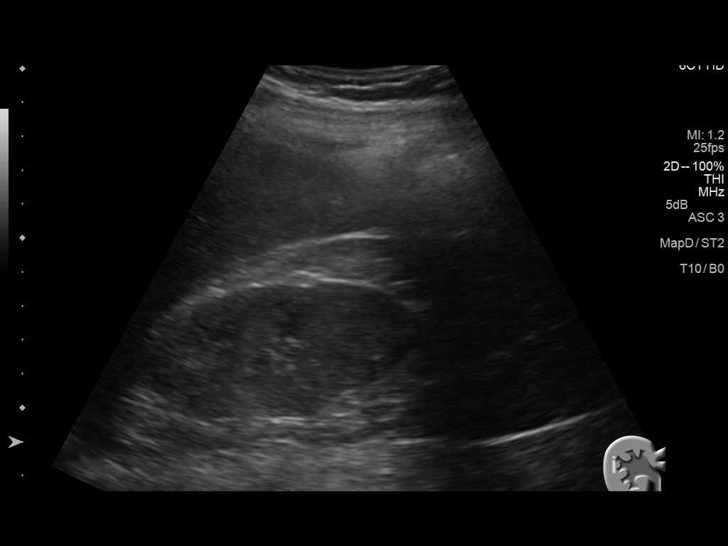
[im 9/25]
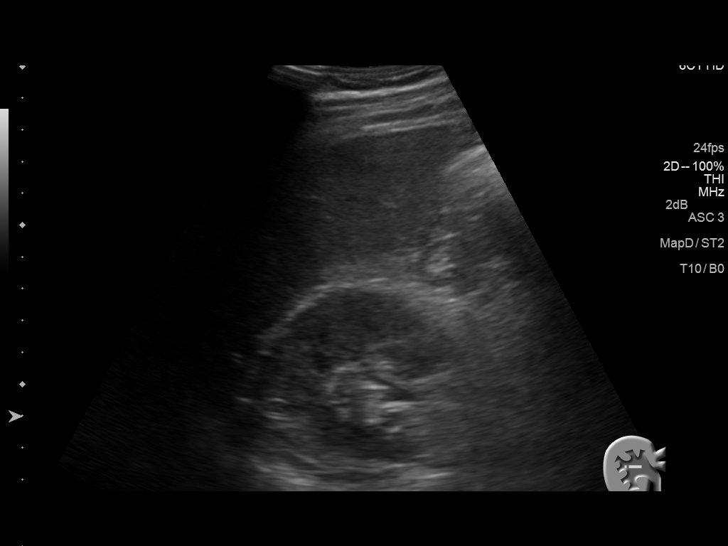
[im 10/25]
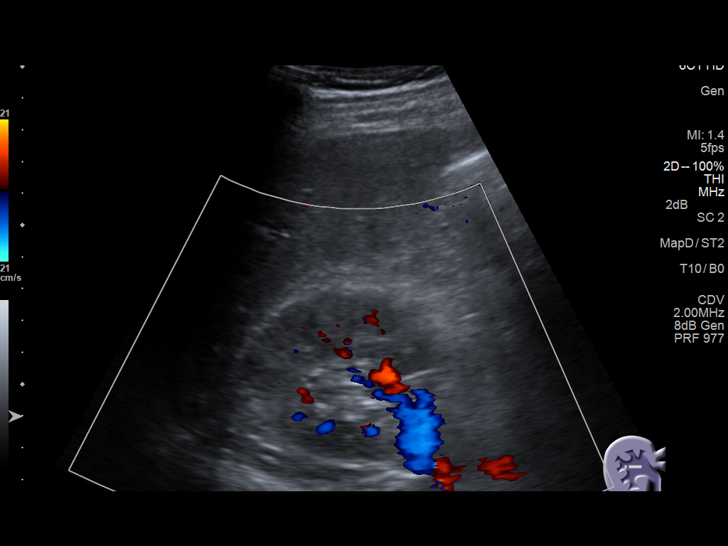
[im 12/25]
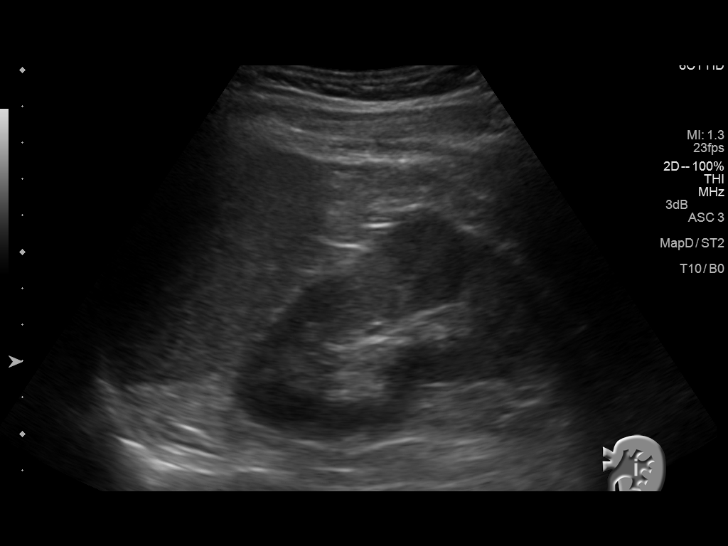
[im 14/25]
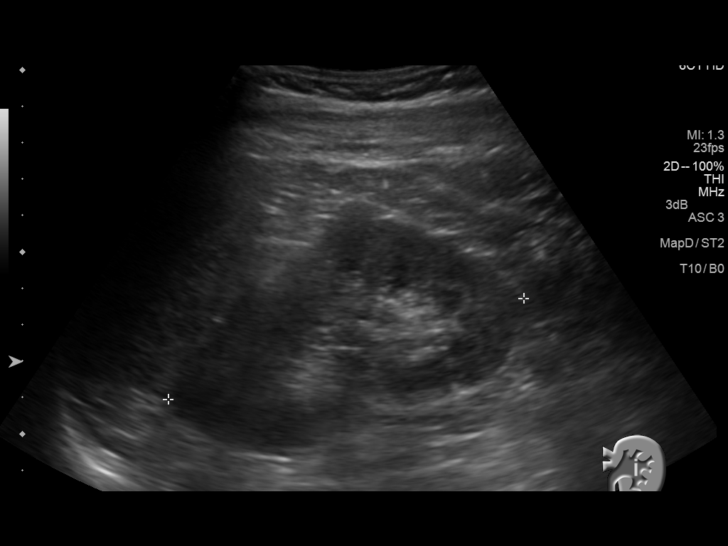
[im 16/25]
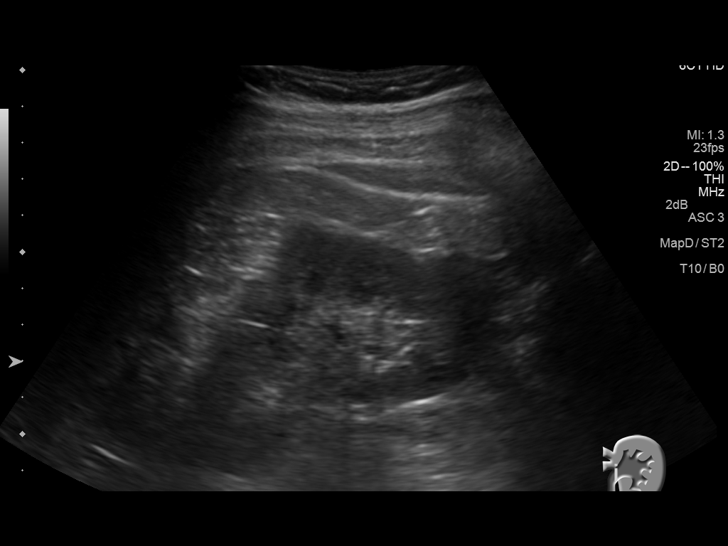
[im 17/25]
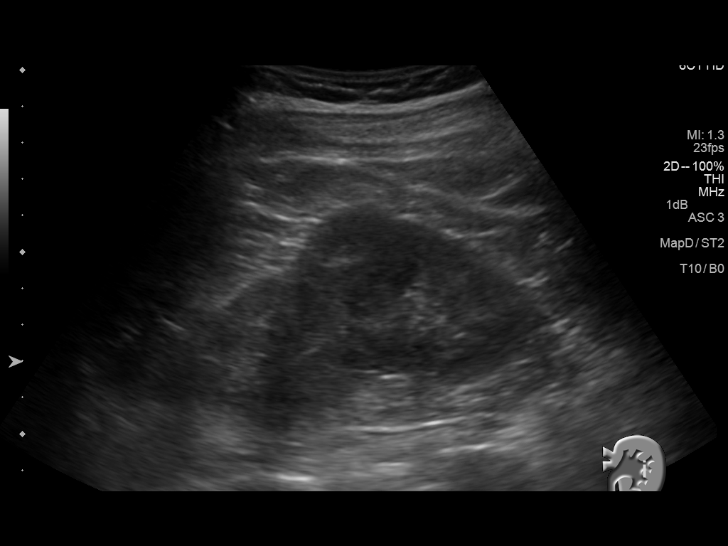
[im 19/25]
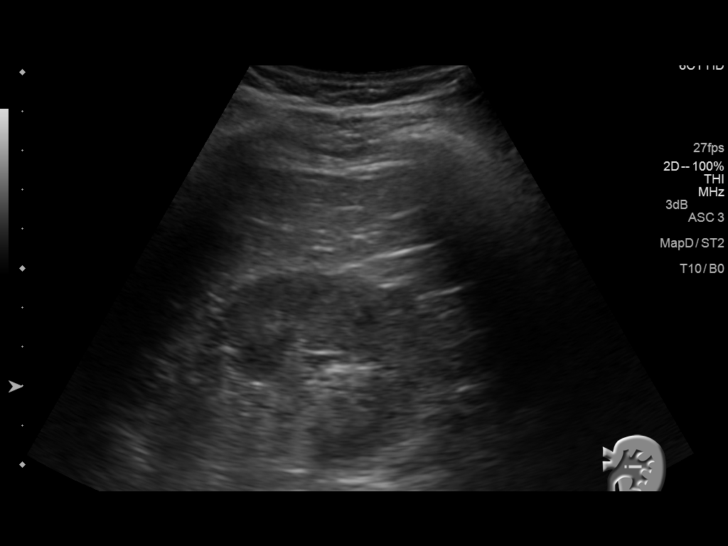
[im 21/25]
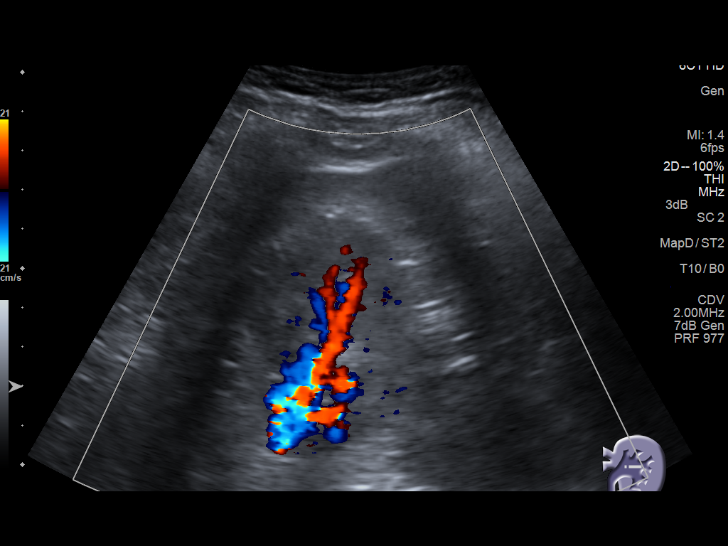
[im 23/25]
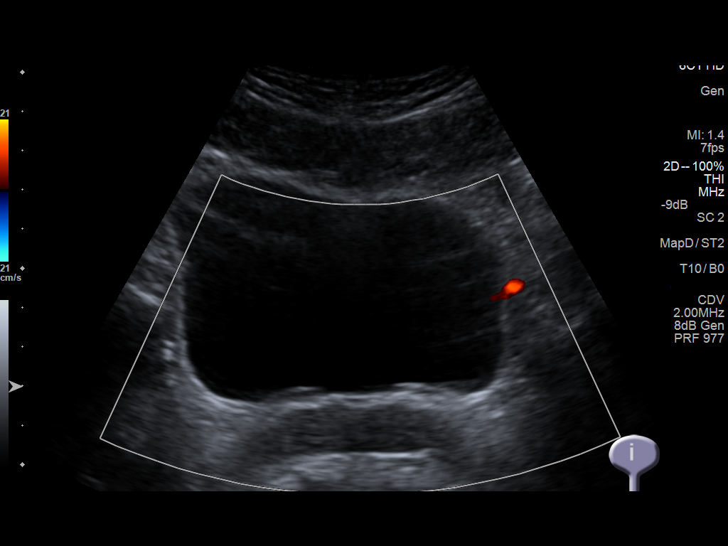
[im 25/25]
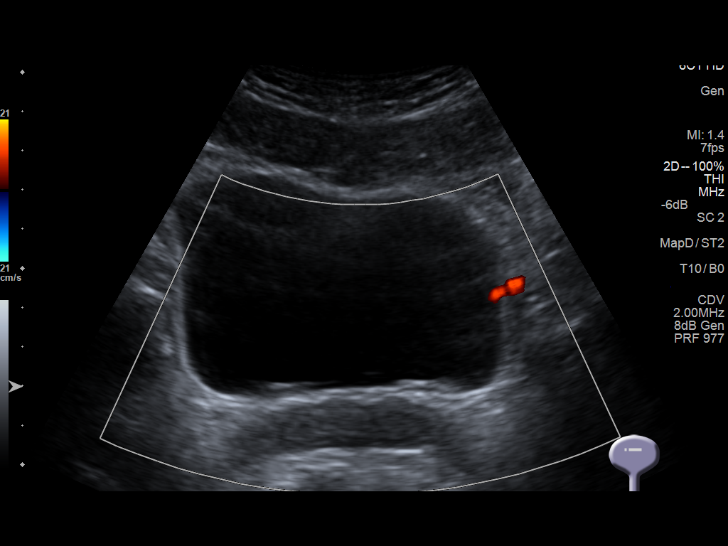

[14 of 25 positions shown; findings below may reference images not displayed]

FINDINGS: Right Kidney:

Length: 10.0 cm. Normal renal cortical thickness and echogenicity
without focal lesions or hydronephrosis. No definite renal calculi.

Left Kidney:

Length: 10.2 cm. Normal renal cortical thickness and echogenicity
without focal lesions or hydronephrosis. No definite renal calculi.

Bladder:

Normal.
IMPRESSION: Normal renal ultrasound examination. No definite renal calculi or
obstructing ureteral calculi.

## 2016-03-14 ENCOUNTER — Ambulatory Visit: Payer: BLUE CROSS/BLUE SHIELD | Admitting: Family Medicine

## 2016-03-16 ENCOUNTER — Ambulatory Visit: Payer: BLUE CROSS/BLUE SHIELD | Admitting: Family Medicine

## 2016-03-19 ENCOUNTER — Encounter: Payer: Self-pay | Admitting: Family Medicine

## 2016-03-19 ENCOUNTER — Ambulatory Visit (INDEPENDENT_AMBULATORY_CARE_PROVIDER_SITE_OTHER): Payer: BLUE CROSS/BLUE SHIELD | Admitting: Family Medicine

## 2016-03-19 VITALS — BP 126/90 | HR 71 | Temp 98.2°F | Ht 67.0 in | Wt 187.8 lb

## 2016-03-19 DIAGNOSIS — B070 Plantar wart: Secondary | ICD-10-CM | POA: Diagnosis not present

## 2016-03-19 NOTE — Progress Notes (Signed)
Dr. Frederico Hamman T. Chelsi Warr, MD, [facility] [place], [address] Phone: [phone] Fax: [phone]  03/19/2016  Patient: Shawn Vaughn, MRN: [id number], DOB: [birth year], 45 y.o.  Primary Physician:  Ria Bush, MD   Chief Complaint  Patient presents with  . Foot Pain    Place on Left Heel   Subjective:   Shawn Vaughn is a 45 y.o. very pleasant male patient who presents with the following:  At least - at the heel of his foot.   Plantar wart. He'll of the left foot. This is painful and has been there for at least 2 years.  Past Medical History, Surgical History, Social History, Family History, Problem List, Medications, and Allergies have been reviewed and updated if relevant.  Patient Active Problem List   Diagnosis Date Noted  . Solitary pulmonary nodule on lung CT 09/21/2015  . Elevated serum creatinine 01/12/2014  . Healthcare maintenance 09/02/2013  . Abnormal alkaline phosphatase test 09/02/2013  . Hypertriglyceridemia   . History of pneumonia   . HTN (hypertension) 06/22/2012    Past Medical History  Diagnosis Date  . Hypertension   . History of pneumonia 2002    staph  . Hypertriglyceridemia   . History of chicken pox   . Lyme disease 05/2015  . Nephrolithiasis 07/2015    with R hydronephrosis that resolved  . Solitary pulmonary nodule on lung CT 09/21/2015    68m RLL, no f/u needed     Past Surgical History  Procedure Laterality Date  . UKoreaechocardiography  2002    WNL per paper chart, EF >55%    Social History   Social History  . Marital Status: Single    Spouse Name: N/A  . Number of Children: N/A  . Years of Education: N/A   Occupational History  . Not on file.   Social History Main Topics  . Smoking status: Never Smoker   . Smokeless tobacco: Never Used  . Alcohol Use: No  . Drug Use: No  . Sexual Activity: Not on file   Other Topics Concern  . Not on file    Social History Narrative   Caffeine: occasional sodas   Lives with mom, and dad but has his own place, 4 dogs   Occupation: works at [employer]   Activity: hunts 3x/wk   Diet: good water, fruits/vegetables daily    Family History  Problem Relation Age of Onset  . Hypertension Mother   . Hyperlipidemia Neg Hx   . Diabetes Neg Hx   . Coronary artery disease Neg Hx   . Stroke Neg Hx   . Cancer Maternal Grandfather [phone]   prostate  . Prostate cancer Maternal Uncle   . Nephrolithiasis Paternal Uncle     Allergies  Allergen Reactions  . Ibuprofen     Blood in stool    Medication list reviewed and updated in full [place]  GEN: No fevers, chills. Nontoxic. Primarily MSK c/o today. MSK: Detailed in the HPI GI: tolerating PO intake without difficulty Neuro: No numbness, parasthesias, or tingling associated. Otherwise the pertinent positives of the ROS are noted above.   Objective:   BP 126/90 mmHg  Pulse 71  Temp(Src) 98.2 F (36.8 C) (Oral)  Ht 5' 7"  (1.702 m)  Wt 187 lb 12 oz (85.163 kg)  BMI 29.40 kg/m2   GEN: WDWN, NAD, Non-toxic, Alert & Oriented x 3 HEENT: Atraumatic,  Normocephalic.  Ears and Nose: No external deformity. EXTR: No clubbing/cyanosis/edema NEURO: Normal gait.  PSYCH: Normally interactive. Conversant. Not depressed or anxious appearing.  Calm demeanor.    On the bimalleolar left foot there is a plantar wart approximately the size of a nickel on the bottom of this foot which is hyperkeratotic and painful to touch  Radiology: No results found.  Assessment and Plan:   Plantar wart of left foot  >25 minutes spent in face to face time with patient, >50% spent in counselling or coordination of care: I reviewed all with the patient, and I had another one of my partners evaluate the patient's foot who concurred with my diagnosis. We reviewed various treatment options. At this point he does not want to proceed with cryosurgery, given  he has a hard work week. I use a #15 blade and after prepping with alcohol debrided this extensively which made the callus and area around the plantar foot much softer and less tender.  Recommended to use over-the-counter wart pads and also use duct tape over this. Follow-up in 2 months if he has not had full resolution when this could be debrided again and then liquid nitrogen to be applied.  Follow-up: No Follow-up on file.  Signed,  Maud Deed. Makesha Belitz, MD   Patient's Medications  New Prescriptions   No medications on file  Previous Medications   AMLODIPINE (NORVASC) 5 MG TABLET    TAKE 1 TABLET (5 MG TOTAL) BY MOUTH DAILY.   FENOFIBRATE (TRICOR) 145 MG TABLET    Take 1 tablet (145 mg total) by mouth daily.   METOPROLOL SUCCINATE (TOPROL-XL) 100 MG 24 HR TABLET    TAKE 1 TABLET (100 MG TOTAL) BY MOUTH DAILY.  Modified Medications   No medications on file  Discontinued Medications   TAMSULOSIN (FLOMAX) 0.4 MG CAPS CAPSULE    TAKE 1 CAPSULE (0.4 MG TOTAL) BY MOUTH DAILY.

## 2016-03-19 NOTE — Progress Notes (Signed)
Pre visit review using our clinic review tool, if applicable. No additional management support is needed unless otherwise documented below in the visit note. 

## 2016-07-09 ENCOUNTER — Encounter: Payer: Self-pay | Admitting: Family Medicine

## 2016-07-09 ENCOUNTER — Ambulatory Visit (INDEPENDENT_AMBULATORY_CARE_PROVIDER_SITE_OTHER): Payer: BLUE CROSS/BLUE SHIELD | Admitting: Family Medicine

## 2016-07-09 VITALS — BP 134/90 | HR 64 | Temp 98.2°F | Wt 189.0 lb

## 2016-07-09 DIAGNOSIS — B070 Plantar wart: Secondary | ICD-10-CM | POA: Diagnosis not present

## 2016-07-09 NOTE — Progress Notes (Signed)
Pre visit review using our clinic review tool, if applicable. No additional management support is needed unless otherwise documented below in the visit note. 

## 2016-07-09 NOTE — Progress Notes (Signed)
   BP 134/90   Pulse 64   Temp 98.2 F (36.8 C) (Oral)   Wt 189 lb (85.7 kg)   BMI 29.60 kg/m    CC: L sole wart Subjective:    Patient ID: Shawn Vaughn, male    DOB: September 03, 1971, 45 y.o.   MRN: [id number]  HPI: Shawn Vaughn is a 45 y.o. male presenting on 07/09/2016 for Other (wart on bottom of left foot)   L heel plantar wart present for 2+ yrs - saw Dr Lorelei Pont 03/2016. Treated with debridement and OTC wart pads with duct tape. As didn't fully resolve, here for cryotherapy. He's tried freeze off and OTC medicated salicylate bandaid. He has been filing heel down.   Relevant past medical, surgical, family and social history reviewed and updated as indicated. Interim medical history since our last visit reviewed. Allergies and medications reviewed and updated. Current Outpatient Prescriptions on File Prior to Visit  Medication Sig  . amLODipine (NORVASC) 5 MG tablet TAKE 1 TABLET (5 MG TOTAL) BY MOUTH DAILY.  . fenofibrate (TRICOR) 145 MG tablet Take 1 tablet (145 mg total) by mouth daily.  . metoprolol succinate (TOPROL-XL) 100 MG 24 hr tablet TAKE 1 TABLET (100 MG TOTAL) BY MOUTH DAILY.   No current facility-administered medications on file prior to visit.     Review of Systems Per HPI unless specifically indicated in ROS section     Objective:    BP 134/90   Pulse 64   Temp 98.2 F (36.8 C) (Oral)   Wt 189 lb (85.7 kg)   BMI 29.60 kg/m   Wt Readings from Last 3 Encounters:  07/09/16 189 lb (85.7 kg)  03/19/16 187 lb 12 oz (85.2 kg)  09/21/15 186 lb 4 oz (84.5 kg)    Physical Exam  Constitutional: He appears well-developed and well-nourished. No distress.  Musculoskeletal: He exhibits no edema.  L heel with thickening of skin and plantar wart present at heel Darker central core present  Nursing note and vitals reviewed.     Assessment & Plan:   Problem List Items Addressed This Visit    Plantar wart of left foot - Primary    Failed OTC  treatment. Liquid nitrogen was applied for 10-12 seconds to the skin lesion and the expected blistering or scabbing reaction explained. Do not pick at the area. Return 2 wks if lesion fails to fully resolve for retreatment.       Other Visit Diagnoses   None.      Follow up plan: Return in about 2 weeks (around 07/23/2016).  Ria Bush, MD

## 2016-07-09 NOTE — Patient Instructions (Signed)
Liquid nitrogen treatment today. Keep area clean and dry as able. Return in 2 wks of not fully better to discuss re treatment.    Plantar Warts Warts are small growths on the skin. They can occur on various areas of the body. When they occur on the underside (sole) of the foot, they are called plantar warts. Plantar warts often occur in groups, with several small warts around a larger growth. They tend to develop over areas of pressure, such as the heel or the ball of the foot. Most warts are not painful, and they usually do not cause problems. However, plantar warts may cause pain when you walk because pressure is applied to them. Warts often go away on their own in time. Various treatments may be done if needed. Sometimes, warts go away and then they come back again. CAUSES Plantar warts are caused by a type of virus that is called human papillomavirus (HPV). HPV attacks a break in the skin of the foot. Walking barefoot can lead to exposure to the virus. These warts may spread to other areas of the sole. They spread to other areas of the body only through direct contact. RISK FACTORS Plantar warts are more likely to develop in:  People who are 81-10 years of age.  People who use public showers or locker rooms.  People who have a weakened body defense system (immune system). SYMPTOMS Plantar warts may be flat or slightly raised. They may grow into the deeper layers of skin or rise above the surface of the skin. Most plantar warts have a rough surface. They may cause pain when you use your foot to support your body weight. DIAGNOSIS A plantar wart can usually be diagnosed from its appearance. In some cases, a tissue sample may be removed (biopsy) to be looked at under a microscope. TREATMENT In many cases, warts do not need treatment. Without treatment, they often go away over a period of many months to a couple years. If treatment is needed, options may include:  Applying medicated solutions,  creams, or patches to the wart. These may be over-the-counter or prescription medicines that make the skin soft so that layers will gradually shed away. In many cases, the medicine is applied one or two times per day and covered with a bandage.  Putting duct tape over the top of the wart (occlusion). You will leave the tape in place for as long as told by your health care provider, then you will replace it with a new strip of tape. This is done until the wart goes away.  Freezing the wart with liquid nitrogen (cryotherapy).  Burning the wart with:  Laser treatment.  An electrified probe (electrocautery).  Injection of a medicine (Candida antigen) into the wart to help the body's immune system to fight off the wart.  Surgery to remove the wart. HOME CARE INSTRUCTIONS  Apply medicated creams or solutions only as told by your health care provider. This may involve:  Soaking the affected area in warm water.  Removing the top layer of softened skin before you apply the medicine. A pumice stone works well for removing the tissue.  Applying a bandage over the affected area after you apply the medicine.  Repeating the process daily or as told by your health care provider.  Do not scratch or pick at a wart.  Wash your hands after you touch a wart.  If a wart is painful, try applying a bandage with a hole in the middle over  the wart. The helps to take pressure off the wart.  Keep all follow-up visits as told by your health care provider. This is important. PREVENTION Take these actions to help prevent warts:  Wear shoes and socks. Change your socks daily.  Keep your feet clean and dry.  Check your feet regularly.  Avoid direct contact with warts on other people. SEEK MEDICAL CARE IF:  Your warts do not improve after treatment.  You have redness, swelling, or pain at the site of a wart.  You have bleeding from a wart that does not stop with light pressure.  You have diabetes  and you develop a wart.   This information is not intended to replace advice given to you by your health care provider. Make sure you discuss any questions you have with your health care provider.   Document Released: 01/26/2004 Document Revised: 07/27/2015 Document Reviewed: 01/31/2015 Elsevier Interactive Patient Education [employer].

## 2016-07-09 NOTE — Assessment & Plan Note (Signed)
Failed OTC treatment. Liquid nitrogen was applied for 10-12 seconds to the skin lesion and the expected blistering or scabbing reaction explained. Do not pick at the area. Return 2 wks if lesion fails to fully resolve for retreatment.

## 2016-07-25 ENCOUNTER — Ambulatory Visit: Payer: BLUE CROSS/BLUE SHIELD | Admitting: Family Medicine

## 2016-07-27 ENCOUNTER — Ambulatory Visit (INDEPENDENT_AMBULATORY_CARE_PROVIDER_SITE_OTHER): Payer: BLUE CROSS/BLUE SHIELD | Admitting: Family Medicine

## 2016-07-27 ENCOUNTER — Encounter: Payer: Self-pay | Admitting: Family Medicine

## 2016-07-27 VITALS — BP 114/82 | HR 68 | Temp 98.3°F | Wt 189.5 lb

## 2016-07-27 DIAGNOSIS — B070 Plantar wart: Secondary | ICD-10-CM | POA: Diagnosis not present

## 2016-07-27 NOTE — Progress Notes (Signed)
   BP 114/82   Pulse 68   Temp 98.3 F (36.8 C) (Oral)   Wt 189 lb 8 oz (86 kg)   BMI 29.68 kg/m    CC: f/u plantar wart Subjective:    Patient ID: Shawn Vaughn, male    DOB: Aug 22, 1971, 45 y.o.   MRN: [id number]  HPI: Shawn Vaughn is a 45 y.o. male presenting on 07/27/2016 for Follow-up   See prior note for details.  Ongoing discomfort- worse Monday but better last few days. No significant improvement since last treatment.   Relevant past medical, surgical, family and social history reviewed and updated as indicated. Interim medical history since our last visit reviewed. Allergies and medications reviewed and updated. Current Outpatient Prescriptions on File Prior to Visit  Medication Sig  . amLODipine (NORVASC) 5 MG tablet TAKE 1 TABLET (5 MG TOTAL) BY MOUTH DAILY.  . fenofibrate (TRICOR) 145 MG tablet Take 1 tablet (145 mg total) by mouth daily.  . metoprolol succinate (TOPROL-XL) 100 MG 24 hr tablet TAKE 1 TABLET (100 MG TOTAL) BY MOUTH DAILY.   No current facility-administered medications on file prior to visit.     Review of Systems Per HPI unless specifically indicated in ROS section     Objective:    BP 114/82   Pulse 68   Temp 98.3 F (36.8 C) (Oral)   Wt 189 lb 8 oz (86 kg)   BMI 29.68 kg/m   Wt Readings from Last 3 Encounters:  07/27/16 189 lb 8 oz (86 kg)  07/09/16 189 lb (85.7 kg)  03/19/16 187 lb 12 oz (85.2 kg)    Physical Exam  Constitutional: He appears well-developed and well-nourished. No distress.  Musculoskeletal: He exhibits no edema.  L heel with thickening of skin and plantar wart present at heel  Nursing note and vitals reviewed.  Plantar wart application: Skin of heel pared down with 15 scalpel. Liquid nitrogen was applied for 10-12 seconds to the skin lesion and the expected blistering or scabbing reaction explained. Return if lesion fails to fully resolve.     Assessment & Plan:   Problem List Items Addressed This Visit    Plantar wart of left foot    Re treatment performed today.  RTC if not fully better 1-2 wks.  Refer to podiatry if no improvement noted.       Other Visit Diagnoses   None.      Follow up plan: No Follow-up on file.  Ria Bush, MD

## 2016-07-27 NOTE — Progress Notes (Signed)
Pre visit review using our clinic review tool, if applicable. No additional management support is needed unless otherwise documented below in the visit note. 

## 2016-07-27 NOTE — Assessment & Plan Note (Signed)
Re treatment performed today.  RTC if not fully better 1-2 wks.  Refer to podiatry if no improvement noted.

## 2016-07-27 NOTE — Patient Instructions (Signed)
Skin pared down and liquid nitrogen applied again. If ongoing trouble, return in 1-2 wks for re treatment. If no improvement, let us know for podiatry referral.

## 2016-09-16 ENCOUNTER — Other Ambulatory Visit: Payer: Self-pay | Admitting: Family Medicine

## 2016-09-16 DIAGNOSIS — R7989 Other specified abnormal findings of blood chemistry: Secondary | ICD-10-CM

## 2016-09-16 DIAGNOSIS — E781 Pure hyperglyceridemia: Secondary | ICD-10-CM

## 2016-09-16 DIAGNOSIS — R748 Abnormal levels of other serum enzymes: Secondary | ICD-10-CM

## 2016-09-19 ENCOUNTER — Other Ambulatory Visit (INDEPENDENT_AMBULATORY_CARE_PROVIDER_SITE_OTHER): Payer: BLUE CROSS/BLUE SHIELD

## 2016-09-19 DIAGNOSIS — E781 Pure hyperglyceridemia: Secondary | ICD-10-CM | POA: Diagnosis not present

## 2016-09-19 DIAGNOSIS — R7989 Other specified abnormal findings of blood chemistry: Secondary | ICD-10-CM | POA: Diagnosis not present

## 2016-09-19 DIAGNOSIS — R748 Abnormal levels of other serum enzymes: Secondary | ICD-10-CM | POA: Diagnosis not present

## 2016-09-19 LAB — CBC WITH DIFFERENTIAL/PLATELET
BASOS PCT: 0.6 % (ref 0.0–3.0)
Basophils Absolute: 0 10*3/uL (ref 0.0–0.1)
EOS PCT: 5.3 % — AB (ref 0.0–5.0)
Eosinophils Absolute: 0.3 10*3/uL (ref 0.0–0.7)
HEMATOCRIT: 42 % (ref 39.0–52.0)
HEMOGLOBIN: 14.4 g/dL (ref 13.0–17.0)
LYMPHS PCT: 39.1 % (ref 12.0–46.0)
Lymphs Abs: 2.2 10*3/uL (ref 0.7–4.0)
MCHC: 34.2 g/dL (ref 30.0–36.0)
MCV: 87.1 fl (ref 78.0–100.0)
MONO ABS: 0.6 10*3/uL (ref 0.1–1.0)
MONOS PCT: 9.9 % (ref 3.0–12.0)
Neutro Abs: 2.5 10*3/uL (ref 1.4–7.7)
Neutrophils Relative %: 45.1 % (ref 43.0–77.0)
Platelets: 308 10*3/uL (ref 150.0–400.0)
RBC: 4.82 Mil/uL (ref 4.22–5.81)
RDW: 13.9 % (ref 11.5–15.5)
WBC: 5.6 10*3/uL (ref 4.0–10.5)

## 2016-09-19 LAB — MICROALBUMIN / CREATININE URINE RATIO
Creatinine,U: 210.2 mg/dL
MICROALB/CREAT RATIO: 0.3 mg/g (ref 0.0–30.0)
Microalb, Ur: <0.7 mg/dL (ref 0.0–1.9)

## 2016-09-19 LAB — LIPID PANEL
CHOLESTEROL: 117 mg/dL (ref 0–200)
HDL: 31.7 mg/dL — ABNORMAL LOW (ref >39.00)
LDL CALC: 55 mg/dL (ref 0–99)
NONHDL: 85.72
Total CHOL/HDL Ratio: 4
Triglycerides: 154 mg/dL — ABNORMAL HIGH (ref 0.0–149.0)
VLDL: 30.8 mg/dL (ref 0.0–40.0)

## 2016-09-19 LAB — COMPREHENSIVE METABOLIC PANEL
ALBUMIN: 4.4 g/dL (ref 3.5–5.2)
ALT: 35 U/L (ref 0–53)
AST: 29 U/L (ref 0–37)
Alkaline Phosphatase: 36 U/L — ABNORMAL LOW (ref 39–117)
BUN: 20 mg/dL (ref 6–23)
CHLORIDE: 101 meq/L (ref 96–112)
CO2: 32 mEq/L (ref 19–32)
CREATININE: 1.5 mg/dL (ref 0.40–1.50)
Calcium: 10 mg/dL (ref 8.4–10.5)
GFR: 65.42 mL/min (ref >60.00)
GLUCOSE: 92 mg/dL (ref 70–99)
Potassium: 4.2 mEq/L (ref 3.5–5.1)
SODIUM: 140 meq/L (ref 135–145)
Total Bilirubin: 0.5 mg/dL (ref 0.2–1.2)
Total Protein: 7.7 g/dL (ref 6.0–8.3)

## 2016-09-19 LAB — VITAMIN D 25 HYDROXY (VIT D DEFICIENCY, FRACTURES): VITD: 24.98 ng/mL — ABNORMAL LOW (ref 30.00–100.00)

## 2016-09-19 LAB — TSH: TSH: 2.67 u[IU]/mL (ref 0.35–4.50)

## 2016-09-24 ENCOUNTER — Encounter: Payer: BLUE CROSS/BLUE SHIELD | Admitting: Family Medicine

## 2016-10-01 ENCOUNTER — Encounter: Payer: BLUE CROSS/BLUE SHIELD | Admitting: Family Medicine

## 2016-10-02 ENCOUNTER — Ambulatory Visit (INDEPENDENT_AMBULATORY_CARE_PROVIDER_SITE_OTHER): Payer: BLUE CROSS/BLUE SHIELD | Admitting: Family Medicine

## 2016-10-02 ENCOUNTER — Encounter: Payer: Self-pay | Admitting: Family Medicine

## 2016-10-02 VITALS — BP 144/100 | HR 84 | Temp 98.2°F | Ht 67.0 in | Wt 189.0 lb

## 2016-10-02 DIAGNOSIS — E781 Pure hyperglyceridemia: Secondary | ICD-10-CM

## 2016-10-02 DIAGNOSIS — R7989 Other specified abnormal findings of blood chemistry: Secondary | ICD-10-CM

## 2016-10-02 DIAGNOSIS — I10 Essential (primary) hypertension: Secondary | ICD-10-CM | POA: Diagnosis not present

## 2016-10-02 DIAGNOSIS — Z0000 Encounter for general adult medical examination without abnormal findings: Secondary | ICD-10-CM

## 2016-10-02 DIAGNOSIS — B070 Plantar wart: Secondary | ICD-10-CM

## 2016-10-02 NOTE — Progress Notes (Signed)
Pre visit review using our clinic review tool, if applicable. No additional management support is needed unless otherwise documented below in the visit note. 

## 2016-10-02 NOTE — Assessment & Plan Note (Signed)
BP continues to improve with good blood pressure control.

## 2016-10-02 NOTE — Assessment & Plan Note (Addendum)
Chronic, stable on tricor 171m daily. continue.

## 2016-10-02 NOTE — Assessment & Plan Note (Signed)
Preventative protocols reviewed and updated unless pt declined. Discussed healthy diet and lifestyle.  

## 2016-10-02 NOTE — Patient Instructions (Addendum)
You are doing well today Return as needed or in 1 year for next physical.  Health Maintenance, Male A healthy lifestyle and preventative care can promote health and wellness.  Maintain regular health, dental, and eye exams.  Eat a healthy diet. Foods like vegetables, fruits, whole grains, low-fat dairy products, and lean protein foods contain the nutrients you need and are low in calories. Decrease your intake of foods high in solid fats, added sugars, and salt. Get information about a proper diet from your health care provider, if necessary.  Regular physical exercise is one of the most important things you can do for your health. Most adults should get at least 150 minutes of moderate-intensity exercise (any activity that increases your heart rate and causes you to sweat) each week. In addition, most adults need muscle-strengthening exercises on 2 or more days a week.   Maintain a healthy weight. The body mass index (BMI) is a screening tool to identify possible weight problems. It provides an estimate of body fat based on height and weight. Your health care provider can find your BMI and can help you achieve or maintain a healthy weight. For males 20 years and older:  A BMI below 18.5 is considered underweight.  A BMI of 18.5 to 24.9 is normal.  A BMI of 25 to 29.9 is considered overweight.  A BMI of 30 and above is considered obese.  Maintain normal blood lipids and cholesterol by exercising and minimizing your intake of saturated fat. Eat a balanced diet with plenty of fruits and vegetables. Blood tests for lipids and cholesterol should begin at age 70 and be repeated every 5 years. If your lipid or cholesterol levels are high, you are over age 58, or you are at high risk for heart disease, you may need your cholesterol levels checked more frequently.Ongoing high lipid and cholesterol levels should be treated with medicines if diet and exercise are not working.  If you smoke, find out  from your health care provider how to quit. If you do not use tobacco, do not start.  Lung cancer screening is recommended for adults aged 20-80 years who are at high risk for developing lung cancer because of a history of smoking. A yearly low-dose CT scan of the lungs is recommended for people who have at least a 30-pack-year history of smoking and are current smokers or have quit within the past 15 years. A pack year of smoking is smoking an average of 1 pack of cigarettes a day for 1 year (for example, a 30-pack-year history of smoking could mean smoking 1 pack a day for 30 years or 2 packs a day for 15 years). Yearly screening should continue until the smoker has stopped smoking for at least 15 years. Yearly screening should be stopped for people who develop a health problem that would prevent them from having lung cancer treatment.  If you choose to drink alcohol, do not have more than 2 drinks per day. One drink is considered to be 12 oz (360 mL) of beer, 5 oz (150 mL) of wine, or 1.5 oz (45 mL) of liquor.  Avoid the use of street drugs. Do not share needles with anyone. Ask for help if you need support or instructions about stopping the use of drugs.  High blood pressure causes heart disease and increases the risk of stroke. High blood pressure is more likely to develop in:  People who have blood pressure in the end of the normal range (100-139/85-89  mm Hg).  People who are overweight or obese.  People who are African American.  If you are 62-75 years of age, have your blood pressure checked every 3-5 years. If you are 68 years of age or older, have your blood pressure checked every year. You should have your blood pressure measured twice-once when you are at a hospital or clinic, and once when you are not at a hospital or clinic. Record the average of the two measurements. To check your blood pressure when you are not at a hospital or clinic, you can use:  An automated blood pressure  machine at a pharmacy.  A home blood pressure monitor.  If you are 75-59 years old, ask your health care provider if you should take aspirin to prevent heart disease.  Diabetes screening involves taking a blood sample to check your fasting blood sugar level. This should be done once every 3 years after age 35 if you are at a normal weight and without risk factors for diabetes. Testing should be considered at a younger age or be carried out more frequently if you are overweight and have at least 1 risk factor for diabetes.  Colorectal cancer can be detected and often prevented. Most routine colorectal cancer screening begins at the age of 28 and continues through age 32. However, your health care provider may recommend screening at an earlier age if you have risk factors for colon cancer. On a yearly basis, your health care provider may provide home test kits to check for hidden blood in the stool. A small camera at the end of a tube may be used to directly examine the colon (sigmoidoscopy or colonoscopy) to detect the earliest forms of colorectal cancer. Talk to your health care provider about this at age 53 when routine screening begins. A direct exam of the colon should be repeated every 5-10 years through age 98, unless early forms of precancerous polyps or small growths are found.  People who are at an increased risk for hepatitis B should be screened for this virus. You are considered at high risk for hepatitis B if:  You were born in a country where hepatitis B occurs often. Talk with your health care provider about which countries are considered high risk.  Your parents were born in a high-risk country and you have not received a shot to protect against hepatitis B (hepatitis B vaccine).  You have HIV or AIDS.  You use needles to inject street drugs.  You live with, or have sex with, someone who has hepatitis B.  You are a man who has sex with other men (MSM).  You get hemodialysis  treatment.  You take certain medicines for conditions like cancer, organ transplantation, and autoimmune conditions.  Hepatitis C blood testing is recommended for all people born from [birth year] through 1964 and any individual with known risk factors for hepatitis C.  Healthy men should no longer receive prostate-specific antigen (PSA) blood tests as part of routine cancer screening. Talk to your health care provider about prostate cancer screening.  Testicular cancer screening is not recommended for adolescents or adult males who have no symptoms. Screening includes self-exam, a health care provider exam, and other screening tests. Consult with your health care provider about any symptoms you have or any concerns you have about testicular cancer.  Practice safe sex. Use condoms and avoid high-risk sexual practices to reduce the spread of sexually transmitted infections (STIs).  You should be screened for STIs, including gonorrhea  and chlamydia if:  You are sexually active and are younger than 24 years.  You are older than 24 years, and your health care provider tells you that you are at risk for this type of infection.  Your sexual activity has changed since you were last screened, and you are at an increased risk for chlamydia or gonorrhea. Ask your health care provider if you are at risk.  If you are at risk of being infected with HIV, it is recommended that you take a prescription medicine daily to prevent HIV infection. This is called pre-exposure prophylaxis (PrEP). You are considered at risk if:  You are a man who has sex with other men (MSM).  You are a heterosexual man who is sexually active with multiple partners.  You take drugs by injection.  You are sexually active with a partner who has HIV.  Talk with your health care provider about whether you are at high risk of being infected with HIV. If you choose to begin PrEP, you should first be tested for HIV. You should then be tested  every 3 months for as long as you are taking PrEP.  Use sunscreen. Apply sunscreen liberally and repeatedly throughout the day. You should seek shade when your shadow is shorter than you. Protect yourself by wearing long sleeves, pants, a wide-brimmed hat, and sunglasses year round whenever you are outdoors.  Tell your health care provider of new moles or changes in moles, especially if there is a change in shape or color. Also, tell your health care provider if a mole is larger than the size of a pencil eraser.  A one-time screening for abdominal aortic aneurysm (AAA) and surgical repair of large AAAs by ultrasound is recommended for men aged 66-75 years who are current or former smokers.  Stay current with your vaccines (immunizations). This information is not intended to replace advice given to you by your health care provider. Make sure you discuss any questions you have with your health care provider. Document Released: 05/03/2008 Document Revised: 11/26/2014 Document Reviewed: 08/09/2015 Elsevier Interactive Patient Education  2017 [employer].

## 2016-10-02 NOTE — Assessment & Plan Note (Addendum)
Uncontrolled due to missing am meds today. Pt endorses good control at home, and previous visits he has been well controlled as well. He will go home and take bp meds this morning.

## 2016-10-02 NOTE — Progress Notes (Signed)
BP (!) 144/100 Comment: No meds this AM  Pulse 84   Temp 98.2 F (36.8 C) (Oral)   Ht 5' 7"  (1.702 m)   Wt 189 lb (85.7 kg)   BMI 29.60 kg/m    CC: CPE Subjective:    Patient ID: Shawn Vaughn, male    DOB: 05/18/1971, 45 y.o.   MRN: [id number]  HPI: Shawn Vaughn is a 45 y.o. male presenting on 10/02/2016 for Annual Exam   Did not take medication this morning. Usually runs well at home.   Preventative: fmhx prostate cancer 9s. Declines flu shot. Tdap 2016 Seat belt use discussed.  Sunscreen use discussed. No changing moles on skin.  Caffeine: occasional sodas  Lives with mom, and dad but has his own place, 4 dogs  Occupation: works at [employer]: HS  Activity: hunts with dogs 3x/wk  Diet: good water, fruits/vegetables daily   Relevant past medical, surgical, family and social history reviewed and updated as indicated. Interim medical history since our last visit reviewed. Allergies and medications reviewed and updated. Current Outpatient Prescriptions on File Prior to Visit  Medication Sig  . amLODipine (NORVASC) 5 MG tablet TAKE 1 TABLET (5 MG TOTAL) BY MOUTH DAILY.  . fenofibrate (TRICOR) 145 MG tablet Take 1 tablet (145 mg total) by mouth daily.  . metoprolol succinate (TOPROL-XL) 100 MG 24 hr tablet TAKE 1 TABLET (100 MG TOTAL) BY MOUTH DAILY.   No current facility-administered medications on file prior to visit.     Review of Systems  Constitutional: Negative for activity change, appetite change, chills, fatigue, fever and unexpected weight change.  HENT: Negative for hearing loss.   Eyes: Negative for visual disturbance.  Respiratory: Negative for cough, chest tightness, shortness of breath and wheezing.   Cardiovascular: Negative for chest pain, palpitations and leg swelling.  Gastrointestinal: Negative for abdominal distention, abdominal pain, blood in stool, constipation, diarrhea, nausea and vomiting.  Genitourinary: Negative for difficulty  urinating and hematuria.  Musculoskeletal: Negative for arthralgias, myalgias and neck pain.  Skin: Negative for rash.  Neurological: Negative for dizziness, seizures, syncope and headaches.  Hematological: Negative for adenopathy. Does not bruise/bleed easily.  Psychiatric/Behavioral: Negative for dysphoric mood. The patient is not nervous/anxious.    Per HPI unless specifically indicated in ROS section     Objective:    BP (!) 144/100 Comment: No meds this AM  Pulse 84   Temp 98.2 F (36.8 C) (Oral)   Ht 5' 7"  (1.702 m)   Wt 189 lb (85.7 kg)   BMI 29.60 kg/m   Wt Readings from Last 3 Encounters:  10/02/16 189 lb (85.7 kg)  07/27/16 189 lb 8 oz (86 kg)  07/09/16 189 lb (85.7 kg)    Physical Exam  Constitutional: He is oriented to person, place, and time. He appears well-developed and well-nourished. No distress.  HENT:  Head: Normocephalic and atraumatic.  Right Ear: Hearing, tympanic membrane, external ear and ear canal normal.  Left Ear: Hearing, tympanic membrane, external ear and ear canal normal.  Nose: Nose normal.  Mouth/Throat: Uvula is midline, oropharynx is clear and moist and mucous membranes are normal. No oropharyngeal exudate, posterior oropharyngeal edema or posterior oropharyngeal erythema.  Eyes: Conjunctivae and EOM are normal. Pupils are equal, round, and reactive to light. No scleral icterus.  Neck: Normal range of motion. Neck supple. No thyromegaly present.  Cardiovascular: Normal rate, regular rhythm, normal heart sounds and intact distal pulses.   No murmur heard. Pulses:  Radial pulses are 2+ on the right side, and 2+ on the left side.  Pulmonary/Chest: Effort normal and breath sounds normal. No respiratory distress. He has no wheezes. He has no rales.  Abdominal: Soft. Bowel sounds are normal. He exhibits no distension and no mass. There is no tenderness. There is no rebound and no guarding.  Musculoskeletal: Normal range of motion. He exhibits  no edema.  Lymphadenopathy:    He has no cervical adenopathy.  Neurological: He is alert and oriented to person, place, and time.  CN grossly intact, station and gait intact  Skin: Skin is warm and dry. No rash noted.  Psychiatric: He has a normal mood and affect. His behavior is normal. Judgment and thought content normal.  Nursing note and vitals reviewed.  Results for orders placed or performed in visit on 09/19/16  Lipid panel  Result Value Ref Range   Cholesterol 117 0 - 200 mg/dL   Triglycerides 154.0 (H) 0.0 - 149.0 mg/dL   HDL 31.70 (L) >39.00 mg/dL   VLDL 30.8 0.0 - 40.0 mg/dL   LDL Cholesterol 55 0 - 99 mg/dL   Total CHOL/HDL Ratio 4    NonHDL 85.72   Comprehensive metabolic panel  Result Value Ref Range   Sodium 140 135 - 145 mEq/L   Potassium 4.2 3.5 - 5.1 mEq/L   Chloride 101 96 - 112 mEq/L   CO2 32 19 - 32 mEq/L   Glucose, Bld 92 70 - 99 mg/dL   BUN 20 6 - 23 mg/dL   Creatinine, Ser 1.50 0.40 - 1.50 mg/dL   Total Bilirubin 0.5 0.2 - 1.2 mg/dL   Alkaline Phosphatase 36 (L) 39 - 117 U/L   AST 29 0 - 37 U/L   ALT 35 0 - 53 U/L   Total Protein 7.7 6.0 - 8.3 g/dL   Albumin 4.4 3.5 - 5.2 g/dL   Calcium 10.0 8.4 - 10.5 mg/dL   GFR 65.42 >60.00 mL/min  TSH  Result Value Ref Range   TSH 2.67 0.35 - 4.50 uIU/mL  Microalbumin / creatinine urine ratio  Result Value Ref Range   Microalb, Ur <0.7 0.0 - 1.9 mg/dL   Creatinine,U 210.2 mg/dL   Microalb Creat Ratio 0.3 0.0 - 30.0 mg/g  CBC with Differential/Platelet  Result Value Ref Range   WBC 5.6 4.0 - 10.5 K/uL   RBC 4.82 4.22 - 5.81 Mil/uL   Hemoglobin 14.4 13.0 - 17.0 g/dL   HCT 42.0 39.0 - 52.0 %   MCV 87.1 78.0 - 100.0 fl   MCHC 34.2 30.0 - 36.0 g/dL   RDW 13.9 11.5 - 15.5 %   Platelets 308.0 150.0 - 400.0 K/uL   Neutrophils Relative % 45.1 43.0 - 77.0 %   Lymphocytes Relative 39.1 12.0 - 46.0 %   Monocytes Relative 9.9 3.0 - 12.0 %   Eosinophils Relative 5.3 (H) 0.0 - 5.0 %   Basophils Relative 0.6 0.0 -  3.0 %   Neutro Abs 2.5 1.4 - 7.7 K/uL   Lymphs Abs 2.2 0.7 - 4.0 K/uL   Monocytes Absolute 0.6 0.1 - 1.0 K/uL   Eosinophils Absolute 0.3 0.0 - 0.7 K/uL   Basophils Absolute 0.0 0.0 - 0.1 K/uL  VITAMIN D 25 Hydroxy (Vit-D Deficiency, Fractures)  Result Value Ref Range   VITD 24.98 (L) 30.00 - 100.00 ng/mL      Assessment & Plan:   Problem List Items Addressed This Visit    Elevated serum creatinine  BP continues to improve with good blood pressure control.      Healthcare maintenance - Primary    Preventative protocols reviewed and updated unless pt declined. Discussed healthy diet and lifestyle.       HTN (hypertension)    Uncontrolled due to missing am meds today. Pt endorses good control at home, and previous visits he has been well controlled as well. He will go home and take bp meds this morning.       Hypertriglyceridemia    Chronic, stable on tricor 174m daily. continue.      RESOLVED: Plantar wart of left foot    This has resolved.          Follow up plan: Return in about 1 year (around 10/02/2017) for annual exam, prior fasting for blood work.  JRia Bush MD

## 2016-10-02 NOTE — Assessment & Plan Note (Signed)
This has resolved.

## 2016-11-02 ENCOUNTER — Other Ambulatory Visit: Payer: Self-pay | Admitting: Family Medicine

## 2016-12-05 ENCOUNTER — Other Ambulatory Visit: Payer: Self-pay | Admitting: Family Medicine

## 2016-12-15 ENCOUNTER — Other Ambulatory Visit: Payer: Self-pay | Admitting: Family Medicine

## 2017-09-03 ENCOUNTER — Other Ambulatory Visit: Payer: Self-pay | Admitting: Family Medicine

## 2017-09-22 ENCOUNTER — Other Ambulatory Visit: Payer: Self-pay | Admitting: Family Medicine

## 2017-09-22 DIAGNOSIS — R748 Abnormal levels of other serum enzymes: Secondary | ICD-10-CM

## 2017-09-22 DIAGNOSIS — R7989 Other specified abnormal findings of blood chemistry: Secondary | ICD-10-CM

## 2017-09-22 DIAGNOSIS — I10 Essential (primary) hypertension: Secondary | ICD-10-CM

## 2017-09-22 DIAGNOSIS — E559 Vitamin D deficiency, unspecified: Secondary | ICD-10-CM

## 2017-09-22 DIAGNOSIS — E781 Pure hyperglyceridemia: Secondary | ICD-10-CM

## 2017-09-27 ENCOUNTER — Other Ambulatory Visit (INDEPENDENT_AMBULATORY_CARE_PROVIDER_SITE_OTHER): Payer: BLUE CROSS/BLUE SHIELD

## 2017-09-27 DIAGNOSIS — E559 Vitamin D deficiency, unspecified: Secondary | ICD-10-CM

## 2017-09-27 DIAGNOSIS — E781 Pure hyperglyceridemia: Secondary | ICD-10-CM | POA: Diagnosis not present

## 2017-09-27 DIAGNOSIS — R748 Abnormal levels of other serum enzymes: Secondary | ICD-10-CM

## 2017-09-27 LAB — COMPREHENSIVE METABOLIC PANEL
ALBUMIN: 4.3 g/dL (ref 3.5–5.2)
ALT: 27 U/L (ref 0–53)
AST: 25 U/L (ref 0–37)
Alkaline Phosphatase: 34 U/L — ABNORMAL LOW (ref 39–117)
BUN: 23 mg/dL (ref 6–23)
CALCIUM: 9.8 mg/dL (ref 8.4–10.5)
CHLORIDE: 100 meq/L (ref 96–112)
CO2: 30 mEq/L (ref 19–32)
Creatinine, Ser: 1.57 mg/dL — ABNORMAL HIGH (ref 0.40–1.50)
GFR: 61.78 mL/min (ref >60.00)
Glucose, Bld: 89 mg/dL (ref 70–99)
POTASSIUM: 3.9 meq/L (ref 3.5–5.1)
Sodium: 137 mEq/L (ref 135–145)
Total Bilirubin: 0.6 mg/dL (ref 0.2–1.2)
Total Protein: 7.9 g/dL (ref 6.0–8.3)

## 2017-09-27 LAB — LIPID PANEL
CHOL/HDL RATIO: 4
CHOLESTEROL: 128 mg/dL (ref 0–200)
HDL: 32.5 mg/dL — AB (ref >39.00)
LDL CALC: 71 mg/dL (ref 0–99)
NONHDL: 95.67
TRIGLYCERIDES: 121 mg/dL (ref 0.0–149.0)
VLDL: 24.2 mg/dL (ref 0.0–40.0)

## 2017-09-27 LAB — VITAMIN D 25 HYDROXY (VIT D DEFICIENCY, FRACTURES): VITD: 15.79 ng/mL — AB (ref 30.00–100.00)

## 2017-10-03 ENCOUNTER — Encounter (INDEPENDENT_AMBULATORY_CARE_PROVIDER_SITE_OTHER): Payer: Self-pay

## 2017-10-03 ENCOUNTER — Ambulatory Visit (INDEPENDENT_AMBULATORY_CARE_PROVIDER_SITE_OTHER): Payer: BLUE CROSS/BLUE SHIELD | Admitting: Family Medicine

## 2017-10-03 ENCOUNTER — Encounter: Payer: Self-pay | Admitting: Family Medicine

## 2017-10-03 VITALS — BP 142/98 | HR 80 | Temp 98.3°F | Ht 67.5 in | Wt 188.0 lb

## 2017-10-03 DIAGNOSIS — J029 Acute pharyngitis, unspecified: Secondary | ICD-10-CM

## 2017-10-03 DIAGNOSIS — R7989 Other specified abnormal findings of blood chemistry: Secondary | ICD-10-CM | POA: Diagnosis not present

## 2017-10-03 DIAGNOSIS — I10 Essential (primary) hypertension: Secondary | ICD-10-CM

## 2017-10-03 DIAGNOSIS — E559 Vitamin D deficiency, unspecified: Secondary | ICD-10-CM

## 2017-10-03 DIAGNOSIS — E781 Pure hyperglyceridemia: Secondary | ICD-10-CM

## 2017-10-03 DIAGNOSIS — Z0000 Encounter for general adult medical examination without abnormal findings: Secondary | ICD-10-CM

## 2017-10-03 MED ORDER — VITAMIN D 50 MCG (2000 UT) PO CAPS: 1.0000 | ORAL_CAPSULE | Freq: Every day | ORAL | Status: DC

## 2017-10-03 NOTE — Assessment & Plan Note (Signed)
Continue to monitor. Discussed importance of good hydration status and need to avoid nephrotoxic agents

## 2017-10-03 NOTE — Assessment & Plan Note (Signed)
Chronic, stable on tricor - continue.

## 2017-10-03 NOTE — Assessment & Plan Note (Signed)
Chronic, mildly elevated. He does feel ill today. Will return in 2-3 momths for bp check.

## 2017-10-03 NOTE — Assessment & Plan Note (Signed)
Preventative protocols reviewed and updated unless pt declined. Discussed healthy diet and lifestyle.  

## 2017-10-03 NOTE — Assessment & Plan Note (Addendum)
RST negative.  Anticipate viral pharyngitis. Supportive care reviewed. Avoid NSAIDs. Update if not improving with treatment.

## 2017-10-03 NOTE — Assessment & Plan Note (Signed)
Ongoing - rec vit D 2000 iu daily.

## 2017-10-03 NOTE — Patient Instructions (Addendum)
Return in 2-3 months for BP check as it was a bit high today. No med changes at this time.  Start vitamin D 2000 units daily.  Ensure you stay well hydrated with plenty of water throughout the day.  You have acute pharyngitis and upper respiratory infection. This is likely viral and should improve with time.  Push fluids and plenty of rest. Salt water gargles. Suck on cold things like popsicles or warm things like herbal teas (whichever soothes the throat better). Return if fever >101.5, worsening pain, or trouble opening/closing mouth, or hoarse voice. Good to see you today, call clinic with questions.   Health Maintenance, Male A healthy lifestyle and preventive care is important for your health and wellness. Ask your health care provider about what schedule of regular examinations is right for you. What should I know about weight and diet? Eat a Healthy Diet  Eat plenty of vegetables, fruits, whole grains, low-fat dairy products, and lean protein.  Do not eat a lot of foods high in solid fats, added sugars, or salt.  Maintain a Healthy Weight Regular exercise can help you achieve or maintain a healthy weight. You should:  Do at least 150 minutes of exercise each week. The exercise should increase your heart rate and make you sweat (moderate-intensity exercise).  Do strength-training exercises at least twice a week.  Watch Your Levels of Cholesterol and Blood Lipids  Have your blood tested for lipids and cholesterol every 5 years starting at 45 years of age. If you are at high risk for heart disease, you should start having your blood tested when you are 45 years old. You may need to have your cholesterol levels checked more often if: ? Your lipid or cholesterol levels are high. ? You are older than 45 years of age. ? You are at high risk for heart disease.  What should I know about cancer screening? Many types of cancers can be detected early and may often be prevented. Lung  Cancer  You should be screened every year for lung cancer if: ? You are a current smoker who has smoked for at least 30 years. ? You are a former smoker who has quit within the past 15 years.  Talk to your health care provider about your screening options, when you should start screening, and how often you should be screened.  Colorectal Cancer  Routine colorectal cancer screening usually begins at 45 years of age and should be repeated every 5-10 years until you are 45 years old. You may need to be screened more often if early forms of precancerous polyps or small growths are found. Your health care provider may recommend screening at an earlier age if you have risk factors for colon cancer.  Your health care provider may recommend using home test kits to check for hidden blood in the stool.  A small camera at the end of a tube can be used to examine your colon (sigmoidoscopy or colonoscopy). This checks for the earliest forms of colorectal cancer.  Prostate and Testicular Cancer  Depending on your age and overall health, your health care provider may do certain tests to screen for prostate and testicular cancer.  Talk to your health care provider about any symptoms or concerns you have about testicular or prostate cancer.  Skin Cancer  Check your skin from head to toe regularly.  Tell your health care provider about any new moles or changes in moles, especially if: ? There is a change in  a mole's size, shape, or color. ? You have a mole that is larger than a pencil eraser.  Always use sunscreen. Apply sunscreen liberally and repeat throughout the day.  Protect yourself by wearing long sleeves, pants, a wide-brimmed hat, and sunglasses when outside.  What should I know about heart disease, diabetes, and high blood pressure?  If you are 36-18 years of age, have your blood pressure checked every 3-5 years. If you are 63 years of age or older, have your blood pressure checked every  year. You should have your blood pressure measured twice-once when you are at a hospital or clinic, and once when you are not at a hospital or clinic. Record the average of the two measurements. To check your blood pressure when you are not at a hospital or clinic, you can use: ? An automated blood pressure machine at a pharmacy. ? A home blood pressure monitor.  Talk to your health care provider about your target blood pressure.  If you are between 3-39 years old, ask your health care provider if you should take aspirin to prevent heart disease.  Have regular diabetes screenings by checking your fasting blood sugar level. ? If you are at a normal weight and have a low risk for diabetes, have this test once every three years after the age of 12. ? If you are overweight and have a high risk for diabetes, consider being tested at a younger age or more often.  A one-time screening for abdominal aortic aneurysm (AAA) by ultrasound is recommended for men aged 32-75 years who are current or former smokers. What should I know about preventing infection? Hepatitis B If you have a higher risk for hepatitis B, you should be screened for this virus. Talk with your health care provider to find out if you are at risk for hepatitis B infection. Hepatitis C Blood testing is recommended for:  Everyone born from [birth year] through 1964.  Anyone with known risk factors for hepatitis C.  Sexually Transmitted Diseases (STDs)  You should be screened each year for STDs including gonorrhea and chlamydia if: ? You are sexually active and are younger than 45 years of age. ? You are older than 45 years of age and your health care provider tells you that you are at risk for this type of infection. ? Your sexual activity has changed since you were last screened and you are at an increased risk for chlamydia or gonorrhea. Ask your health care provider if you are at risk.  Talk with your health care provider about  whether you are at high risk of being infected with HIV. Your health care provider may recommend a prescription medicine to help prevent HIV infection.  What else can I do?  Schedule regular health, dental, and eye exams.  Stay current with your vaccines (immunizations).  Do not use any tobacco products, such as cigarettes, chewing tobacco, and e-cigarettes. If you need help quitting, ask your health care provider.  Limit alcohol intake to no more than 2 drinks per day. One drink equals 12 ounces of beer, 5 ounces of wine, or 1 ounces of hard liquor.  Do not use street drugs.  Do not share needles.  Ask your health care provider for help if you need support or information about quitting drugs.  Tell your health care provider if you often feel depressed.  Tell your health care provider if you have ever been abused or do not feel safe at home. This information  is not intended to replace advice given to you by your health care provider. Make sure you discuss any questions you have with your health care provider. Document Released: 05/03/2008 Document Revised: 07/04/2016 Document Reviewed: 08/09/2015 Elsevier Interactive Patient Education  [employer].

## 2017-10-03 NOTE — Progress Notes (Signed)
BP (!) 142/98 (BP Location: Right Arm, Cuff Size: Normal)   Pulse 80   Temp 98.3 F (36.8 C) (Oral)   Ht 5' 7.5" (1.715 m)   Wt 188 lb (85.3 kg)   SpO2 97%   BMI 29.01 kg/m    CC: CPE Subjective:    Patient ID: Shawn Vaughn, male    DOB: 1971-03-31, 46 y.o.   MRN: [id number]  HPI: Shawn Vaughn is a 46 y.o. male presenting on 10/03/2017 for Annual Exam; Sore Throat (was scratchy and burning sensation, improved); Sinus Problem (Started 09/30/17. Has not taken anything); and Headache   3d h/o HA, sinus congestion and pain, and sore throat. Productive cough of mucous. Treating with EZ cold lozenge and corcedin. No fevers/chills or swollen glands.   BP elevated today - attributes to feeling ill. 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 Wants to return when feeling well to ensure bp staying well controlled.   Preventative: fmhx prostate cancer 73s. Declines flu shot. Tdap 2016 Seat belt use discussed.  Sunscreen use discussed. No changing moles on skin. Non smoker Alcohol - none  Caffeine: occasional sodas  Lives with mom, and dad but has his own place, 4 dogs  Occupation: works at [employer]: HS  Activity: hunts with dogs 3x/wk  Diet: good water, fruits/vegetables daily   Relevant past medical, surgical, family and social history reviewed and updated as indicated. Interim medical history since our last visit reviewed. Allergies and medications reviewed and updated. Outpatient Medications Prior to Visit  Medication Sig Dispense Refill  . amLODipine (NORVASC) 5 MG tablet TAKE 1 TABLET (5 MG TOTAL) BY MOUTH DAILY. 90 tablet 3  . fenofibrate (TRICOR) 145 MG tablet TAKE 1 TABLET (145 MG TOTAL) BY MOUTH DAILY. 90 tablet 3  . metoprolol succinate (TOPROL-XL) 100 MG 24 hr tablet TAKE 1 TABLET (100 MG TOTAL) BY MOUTH DAILY. 90 tablet 0   No facility-administered medications prior to visit.      Per HPI unless specifically indicated in ROS section below Review of Systems  Constitutional:  Negative for activity change, appetite change, chills, fatigue, fever and unexpected weight change.  HENT: Positive for congestion, sinus pressure and sore throat. Negative for hearing loss.   Eyes: Negative for visual disturbance.  Respiratory: Positive for cough. Negative for chest tightness, shortness of breath and wheezing.   Cardiovascular: Negative for chest pain, palpitations and leg swelling.  Gastrointestinal: Negative for abdominal distention, abdominal pain, blood in stool, constipation, diarrhea, nausea and vomiting.  Genitourinary: Negative for difficulty urinating and hematuria.  Musculoskeletal: Negative for arthralgias, myalgias and neck pain.  Skin: Negative for rash.  Neurological: Negative for dizziness, seizures, syncope and headaches.  Hematological: Negative for adenopathy. Does not bruise/bleed easily.  Psychiatric/Behavioral: Negative for dysphoric mood. The patient is not nervous/anxious.        Objective:    BP (!) 142/98 (BP Location: Right Arm, Cuff Size: Normal)   Pulse 80   Temp 98.3 F (36.8 C) (Oral)   Ht 5' 7.5" (1.715 m)   Wt 188 lb (85.3 kg)   SpO2 97%   BMI 29.01 kg/m   Wt Readings from Last 3 Encounters:  10/03/17 188 lb (85.3 kg)  10/02/16 189 lb (85.7 kg)  07/27/16 189 lb 8 oz (86 kg)    Physical Exam  Constitutional: He is oriented to person, place, and time. He appears well-developed and well-nourished. No distress.  HENT:  Head: Normocephalic and atraumatic.  Right Ear: Hearing, tympanic membrane, external ear and ear  canal normal.  Left Ear: Hearing, tympanic membrane, external ear and ear canal normal.  Nose: Nose normal. No mucosal edema or rhinorrhea. Right sinus exhibits no maxillary sinus tenderness and no frontal sinus tenderness. Left sinus exhibits no maxillary sinus tenderness and no frontal sinus tenderness.  Mouth/Throat: Uvula is midline and mucous membranes are normal. Posterior oropharyngeal erythema present. No  oropharyngeal exudate, posterior oropharyngeal edema or tonsillar abscesses.  Eyes: Conjunctivae and EOM are normal. Pupils are equal, round, and reactive to light. No scleral icterus.  Neck: Normal range of motion. Neck supple. No thyromegaly present.  Cardiovascular: Normal rate, regular rhythm, normal heart sounds and intact distal pulses.  No murmur heard. Pulses:      Radial pulses are 2+ on the right side, and 2+ on the left side.  Pulmonary/Chest: Effort normal and breath sounds normal. No respiratory distress. He has no wheezes. He has no rales.  Lungs clear  Abdominal: Soft. Bowel sounds are normal. He exhibits no distension and no mass. There is no tenderness. There is no rebound and no guarding.  Musculoskeletal: Normal range of motion. He exhibits no edema.  Lymphadenopathy:    He has no cervical adenopathy.  Neurological: He is alert and oriented to person, place, and time.  CN grossly intact, station and gait intact  Skin: Skin is warm and dry. No rash noted.  Psychiatric: He has a normal mood and affect. His behavior is normal. Judgment and thought content normal.  Nursing note and vitals reviewed.  Results for orders placed or performed in visit on 09/27/17  Comprehensive metabolic panel  Result Value Ref Range   Sodium 137 135 - 145 mEq/L   Potassium 3.9 3.5 - 5.1 mEq/L   Chloride 100 96 - 112 mEq/L   CO2 30 19 - 32 mEq/L   Glucose, Bld 89 70 - 99 mg/dL   BUN 23 6 - 23 mg/dL   Creatinine, Ser 1.57 (H) 0.40 - 1.50 mg/dL   Total Bilirubin 0.6 0.2 - 1.2 mg/dL   Alkaline Phosphatase 34 (L) 39 - 117 U/L   AST 25 0 - 37 U/L   ALT 27 0 - 53 U/L   Total Protein 7.9 6.0 - 8.3 g/dL   Albumin 4.3 3.5 - 5.2 g/dL   Calcium 9.8 8.4 - 10.5 mg/dL   GFR 61.78 >60.00 mL/min  Lipid panel  Result Value Ref Range   Cholesterol 128 0 - 200 mg/dL   Triglycerides 121.0 0.0 - 149.0 mg/dL   HDL 32.50 (L) >39.00 mg/dL   VLDL 24.2 0.0 - 40.0 mg/dL   LDL Cholesterol 71 0 - 99 mg/dL    Total CHOL/HDL Ratio 4    NonHDL 95.67   VITAMIN D 25 Hydroxy (Vit-D Deficiency, Fractures)  Result Value Ref Range   VITD 15.79 (L) 30.00 - 100.00 ng/mL      Assessment & Plan:   Problem List Items Addressed This Visit    Acute pharyngitis    RST negative.  Anticipate viral pharyngitis. Supportive care reviewed. Avoid NSAIDs. Update if not improving with treatment.       Elevated serum creatinine    Continue to monitor. Discussed importance of good hydration status and need to avoid nephrotoxic agents      Healthcare maintenance - Primary    Preventative protocols reviewed and updated unless pt declined. Discussed healthy diet and lifestyle.       HTN (hypertension)    Chronic, mildly elevated. He does feel ill today. Will return  in 2-3 momths for bp check.       Hypertriglyceridemia    Chronic, stable on tricor - continue.       Vitamin D deficiency    Ongoing - rec vit D 2000 iu daily.          Follow up plan: Return in about 3 months (around 01/03/2018) for follow up visit.  Ria Bush, MD

## 2017-10-13 ENCOUNTER — Other Ambulatory Visit: Payer: Self-pay | Admitting: Family Medicine

## 2017-10-25 ENCOUNTER — Other Ambulatory Visit: Payer: Self-pay | Admitting: Family Medicine

## 2017-12-05 ENCOUNTER — Other Ambulatory Visit: Payer: Self-pay | Admitting: Family Medicine

## 2017-12-13 ENCOUNTER — Encounter: Payer: Self-pay | Admitting: Family Medicine

## 2017-12-13 ENCOUNTER — Ambulatory Visit: Payer: BLUE CROSS/BLUE SHIELD | Admitting: Family Medicine

## 2017-12-13 VITALS — BP 160/106 | HR 79 | Temp 98.3°F | Wt 190.0 lb

## 2017-12-13 DIAGNOSIS — E559 Vitamin D deficiency, unspecified: Secondary | ICD-10-CM

## 2017-12-13 DIAGNOSIS — I10 Essential (primary) hypertension: Secondary | ICD-10-CM | POA: Diagnosis not present

## 2017-12-13 MED ORDER — AMLODIPINE BESYLATE 10 MG PO TABS: 10.0000 mg | ORAL_TABLET | Freq: Every day | ORAL | 6 refills | Status: DC

## 2017-12-13 NOTE — Assessment & Plan Note (Signed)
bp remaining elevated. Will increase amlodipine to 45m daily. Pt will update me with effect in 2-3 wks. Advised he start monitoring at home. Reviewed low salt diet.

## 2017-12-13 NOTE — Patient Instructions (Signed)
Blood pressures are staying elevated Start checking at home. Increase amlodipine to 14m daily (double up until you run out).  Continue metoprolol XL 1072monce daily. Good to see you today, call [place] questions. Let [facility] if staying elevated despite this.

## 2017-12-13 NOTE — Assessment & Plan Note (Addendum)
Has not been taking supplement. rec start 2000 IU daily.

## 2017-12-13 NOTE — Progress Notes (Signed)
BP (!) 160/106 (BP Location: Right Arm, Cuff Size: Large)   Pulse 79   Temp 98.3 F (36.8 C) (Oral)   Wt 190 lb (86.2 kg)   SpO2 98%   BMI 29.32 kg/m    CC: 3 mo f/u visit Subjective:    Patient ID: Shawn Vaughn, male    DOB: 27-Apr-1971, 47 y.o.   MRN: [id number]  HPI: Shawn Vaughn is a 47 y.o. male presenting on 12/13/2017 for 2-3 mo follow-up   HTN - Compliant with current antihypertensive regimen of amlodipine 29m daily, toprol XL 1074mdaily. Does not check blood pressures at home. No low blood pressure readings or symptoms of dizziness/syncope. Denies HA, vision changes, CP/tightness, SOB, leg swelling.   Relevant past medical, surgical, family and social history reviewed and updated as indicated. Interim medical history since our last visit reviewed. Allergies and medications reviewed and updated. Outpatient Medications Prior to Visit  Medication Sig Dispense Refill  . Cholecalciferol (VITAMIN D) 2000 units CAPS Take 1 capsule (2,000 Units total) daily by mouth. 30 capsule   . fenofibrate (TRICOR) 145 MG tablet TAKE 1 TABLET (145 MG TOTAL) BY MOUTH DAILY. 90 tablet 0  . metoprolol succinate (TOPROL-XL) 100 MG 24 hr tablet TAKE 1 TABLET (100 MG TOTAL) BY MOUTH DAILY. 90 tablet 0  . amLODipine (NORVASC) 5 MG tablet TAKE 1 TABLET (5 MG TOTAL) BY MOUTH DAILY. 90 tablet 0   No facility-administered medications prior to visit.      Per HPI unless specifically indicated in ROS section below Review of Systems     Objective:    BP (!) 160/106 (BP Location: Right Arm, Cuff Size: Large)   Pulse 79   Temp 98.3 F (36.8 C) (Oral)   Wt 190 lb (86.2 kg)   SpO2 98%   BMI 29.32 kg/m   Wt Readings from Last 3 Encounters:  12/13/17 190 lb (86.2 kg)  10/03/17 188 lb (85.3 kg)  10/02/16 189 lb (85.7 kg)    Physical Exam  Constitutional: He appears well-developed and well-nourished. No distress.  HENT:  Mouth/Throat: Oropharynx is clear and moist. No oropharyngeal  exudate.  Cardiovascular: Normal rate, regular rhythm, normal heart sounds and intact distal pulses.  No murmur heard. Pulmonary/Chest: Effort normal and breath sounds normal. No respiratory distress. He has no wheezes. He has no rales.  Musculoskeletal: He exhibits no edema.  Psychiatric: He has a normal mood and affect.  Nursing note and vitals reviewed.  Results for orders placed or performed in visit on 09/27/17  Comprehensive metabolic panel  Result Value Ref Range   Sodium 137 135 - 145 mEq/L   Potassium 3.9 3.5 - 5.1 mEq/L   Chloride 100 96 - 112 mEq/L   CO2 30 19 - 32 mEq/L   Glucose, Bld 89 70 - 99 mg/dL   BUN 23 6 - 23 mg/dL   Creatinine, Ser 1.57 (H) 0.40 - 1.50 mg/dL   Total Bilirubin 0.6 0.2 - 1.2 mg/dL   Alkaline Phosphatase 34 (L) 39 - 117 U/L   AST 25 0 - 37 U/L   ALT 27 0 - 53 U/L   Total Protein 7.9 6.0 - 8.3 g/dL   Albumin 4.3 3.5 - 5.2 g/dL   Calcium 9.8 8.4 - 10.5 mg/dL   GFR 61.78 >60.00 mL/min  Lipid panel  Result Value Ref Range   Cholesterol 128 0 - 200 mg/dL   Triglycerides 121.0 0.0 - 149.0 mg/dL   HDL 32.50 (L) >39.00 mg/dL  VLDL 24.2 0.0 - 40.0 mg/dL   LDL Cholesterol 71 0 - 99 mg/dL   Total CHOL/HDL Ratio 4    NonHDL 95.67   VITAMIN D 25 Hydroxy (Vit-D Deficiency, Fractures)  Result Value Ref Range   VITD 15.79 (L) 30.00 - 100.00 ng/mL      Assessment & Plan:   Problem List Items Addressed This Visit    HTN (hypertension) - Primary    bp remaining elevated. Will increase amlodipine to 73m daily. Pt will update me with effect in 2-3 wks. Advised he start monitoring at home. Reviewed low salt diet.      Relevant Medications   amLODipine (NORVASC) 10 MG tablet   Vitamin D deficiency    Has not been taking supplement. rec start 2000 IU daily.          Follow up plan: Return if symptoms worsen or fail to improve.  JRia Bush MD

## 2018-01-25 ENCOUNTER — Other Ambulatory Visit: Payer: Self-pay | Admitting: Family Medicine

## 2018-03-05 ENCOUNTER — Other Ambulatory Visit: Payer: Self-pay | Admitting: Family Medicine

## 2018-06-04 ENCOUNTER — Other Ambulatory Visit: Payer: Self-pay | Admitting: Family Medicine

## 2018-06-16 ENCOUNTER — Ambulatory Visit: Payer: BLUE CROSS/BLUE SHIELD | Admitting: Family Medicine

## 2018-07-01 ENCOUNTER — Encounter

## 2018-07-01 ENCOUNTER — Encounter: Payer: Self-pay | Admitting: Family Medicine

## 2018-07-01 ENCOUNTER — Ambulatory Visit: Payer: BLUE CROSS/BLUE SHIELD | Admitting: Family Medicine

## 2018-07-01 VITALS — BP 136/84 | HR 84 | Temp 97.8°F | Ht 67.5 in | Wt 191.2 lb

## 2018-07-01 DIAGNOSIS — B079 Viral wart, unspecified: Secondary | ICD-10-CM

## 2018-07-01 DIAGNOSIS — R221 Localized swelling, mass and lump, neck: Secondary | ICD-10-CM | POA: Insufficient documentation

## 2018-07-01 DIAGNOSIS — B078 Other viral warts: Secondary | ICD-10-CM | POA: Insufficient documentation

## 2018-07-01 NOTE — Assessment & Plan Note (Addendum)
R index finger x1, R palm of hand x2. LN2 therapy performed Routine precautions discussed.

## 2018-07-01 NOTE — Progress Notes (Addendum)
BP 136/84 (BP Location: Left Arm, Patient Position: Sitting, Cuff Size: Normal)   Pulse 84   Temp 97.8 F (36.6 C) (Oral)   Ht 5' 7.5" (1.715 m)   Wt 191 lb 4 oz (86.8 kg)   SpO2 98%   BMI 29.51 kg/m    CC: check mass on neck Subjective:    Patient ID: Shawn Vaughn, male    DOB: [birth year], 47 y.o.   MRN: [id number]  HPI: Shawn Vaughn is a 47 y.o. male presenting on 07/01/2018 for Mass (C/o knot on left side of neck. Noticed about 2 wks ago. Was able to palpate better when turning head to the right. However, no longer feels knot. )   2 wk h/o mas noted R side of lateral neck. Initially sore. Seems to be decreasing in size over last few days.   No recent URI, ST, fevers.  Has not tried anything for this.   Also would like R index finger wart evaluated - tender, present for several weeks.   Relevant past medical, surgical, family and social history reviewed and updated as indicated. Interim medical history since our last visit reviewed. Allergies and medications reviewed and updated. Outpatient Medications Prior to Visit  Medication Sig Dispense Refill  . amLODipine (NORVASC) 10 MG tablet TAKE 1 TABLET BY MOUTH EVERY DAY 90 tablet 1  . Cholecalciferol (VITAMIN D) 2000 units CAPS Take 1 capsule (2,000 Units total) daily by mouth. 30 capsule   . fenofibrate (TRICOR) 145 MG tablet TAKE 1 TABLET (145 MG TOTAL) BY MOUTH DAILY. 90 tablet 2  . metoprolol succinate (TOPROL-XL) 100 MG 24 hr tablet TAKE 1 TABLET (100 MG TOTAL) BY MOUTH DAILY. 90 tablet 2   No facility-administered medications prior to visit.      Per HPI unless specifically indicated in ROS section below Review of Systems     Objective:    BP 136/84 (BP Location: Left Arm, Patient Position: Sitting, Cuff Size: Normal)   Pulse 84   Temp 97.8 F (36.6 C) (Oral)   Ht 5' 7.5" (1.715 m)   Wt 191 lb 4 oz (86.8 kg)   SpO2 98%   BMI 29.51 kg/m   Wt Readings from Last 3 Encounters:  07/01/18 191 lb 4 oz (86.8  kg)  12/13/17 190 lb (86.2 kg)  10/03/17 188 lb (85.3 kg)    Physical Exam  Constitutional: He appears well-developed and well-nourished. No distress.  HENT:  Head: Normocephalic and atraumatic.  Right Ear: Hearing, tympanic membrane, external ear and ear canal normal.  Left Ear: Hearing, tympanic membrane, external ear and ear canal normal.  Nose: Nose normal. No mucosal edema or rhinorrhea. Right sinus exhibits no maxillary sinus tenderness and no frontal sinus tenderness. Left sinus exhibits no maxillary sinus tenderness and no frontal sinus tenderness.  Mouth/Throat: Uvula is midline, oropharynx is clear and moist and mucous membranes are normal. No oropharyngeal exudate, posterior oropharyngeal edema, posterior oropharyngeal erythema or tonsillar abscesses.  No dental lesions noted  Eyes: Pupils are equal, round, and reactive to light. Conjunctivae and EOM are normal. No scleral icterus.  Neck: Normal range of motion. Neck supple. No thyromegaly present.  No appreciable lymphadenopathy  Lymphadenopathy:    He has no cervical adenopathy.  Skin: Skin is warm and dry. No rash noted.  R index finger distal to DIP with small subcm tender verrucous growth Another 2 growths on palm proximal to 4th MCP  Nursing note and vitals reviewed.  R index finger and R  hand cryotherapy Liquid nitrogen was applied for 10-12 seconds to the 3 skin lesions and the expected blistering or scabbing reaction explained. Do not pick at the area. Patient reminded to expect hypopigmented scars from the procedure. Return if lesion fails to fully resolve.    Assessment & Plan:   Problem List Items Addressed This Visit    Verruca vulgaris    R index finger x1, R palm of hand x2. LN2 therapy performed Routine precautions discussed.      Mass of right side of neck - Primary    Actually today benign exam, no mass appreciated. Anticipate reactive LAD, reviewed benign nature of condition.  Will monitor for now,  pt will update me if return of mass.           No orders of the defined types were placed in this encounter.  No orders of the defined types were placed in this encounter.   Follow up plan: No follow-ups on file.  Ria Bush, MD

## 2018-07-01 NOTE — Assessment & Plan Note (Signed)
Actually today benign exam, no mass appreciated. Anticipate reactive LAD, reviewed benign nature of condition.  Will monitor for now, pt will update me if return of mass.

## 2018-07-01 NOTE — Patient Instructions (Signed)
Neck looks ok today. Likely benign lymph node that has returned to normal.  Let us know if recurrent or persistent swelling past 3-4 weeks.

## 2018-07-07 ENCOUNTER — Telehealth: Payer: Self-pay | Admitting: Family Medicine

## 2018-07-07 NOTE — Telephone Encounter (Signed)
Copied from [place] [id number]. Topic: Quick Communication - See Telephone Encounter >> Jul 07, 2018  2:33 PM Neva Seat wrote: Pt asked mother to call can check if it will be ok to pop the blister inside of hand, on finger from wart being removed?  Should he leave it alone and let it dry up? Please call mother back to  let her know.

## 2018-07-07 NOTE — Telephone Encounter (Signed)
Unless very large, or enlarging, or affecting function of finger, would try to avoid popping blister.

## 2018-07-08 NOTE — Telephone Encounter (Signed)
Spoke with pt's mother, Joaquim Lai (on dpr), relaying Dr. Synthia Innocent message.  Verbalizes understanding and will relay this to pt.

## 2018-11-01 ENCOUNTER — Other Ambulatory Visit: Payer: Self-pay | Admitting: Family Medicine

## 2018-11-06 ENCOUNTER — Other Ambulatory Visit (INDEPENDENT_AMBULATORY_CARE_PROVIDER_SITE_OTHER): Payer: BLUE CROSS/BLUE SHIELD

## 2018-11-06 ENCOUNTER — Other Ambulatory Visit: Payer: Self-pay | Admitting: Family Medicine

## 2018-11-06 DIAGNOSIS — I10 Essential (primary) hypertension: Secondary | ICD-10-CM | POA: Diagnosis not present

## 2018-11-06 DIAGNOSIS — E781 Pure hyperglyceridemia: Secondary | ICD-10-CM

## 2018-11-06 DIAGNOSIS — R748 Abnormal levels of other serum enzymes: Secondary | ICD-10-CM

## 2018-11-06 DIAGNOSIS — E559 Vitamin D deficiency, unspecified: Secondary | ICD-10-CM

## 2018-11-06 LAB — COMPREHENSIVE METABOLIC PANEL
ALT: 31 U/L (ref 0–53)
AST: 31 U/L (ref 0–37)
Albumin: 4.6 g/dL (ref 3.5–5.2)
Alkaline Phosphatase: 39 U/L (ref 39–117)
BUN: 25 mg/dL — ABNORMAL HIGH (ref 6–23)
CO2: 28 mEq/L (ref 19–32)
Calcium: 9.8 mg/dL (ref 8.4–10.5)
Chloride: 102 mEq/L (ref 96–112)
Creatinine, Ser: 1.59 mg/dL — ABNORMAL HIGH (ref 0.40–1.50)
GFR: 60.58 mL/min (ref >60.00)
GLUCOSE: 95 mg/dL (ref 70–99)
POTASSIUM: 3.7 meq/L (ref 3.5–5.1)
Sodium: 139 mEq/L (ref 135–145)
Total Bilirubin: 0.7 mg/dL (ref 0.2–1.2)
Total Protein: 8.4 g/dL — ABNORMAL HIGH (ref 6.0–8.3)

## 2018-11-06 LAB — LIPID PANEL
Cholesterol: 118 mg/dL (ref 0–200)
HDL: 38.8 mg/dL — ABNORMAL LOW (ref >39.00)
LDL CALC: 62 mg/dL (ref 0–99)
NonHDL: 78.98
Total CHOL/HDL Ratio: 3
Triglycerides: 85 mg/dL (ref 0.0–149.0)
VLDL: 17 mg/dL (ref 0.0–40.0)

## 2018-11-06 LAB — MICROALBUMIN / CREATININE URINE RATIO
Creatinine,U: 161.5 mg/dL
Microalb Creat Ratio: 0.4 mg/g (ref 0.0–30.0)

## 2018-11-06 LAB — TSH: TSH: 3.74 u[IU]/mL (ref 0.35–4.50)

## 2018-11-06 LAB — VITAMIN D 25 HYDROXY (VIT D DEFICIENCY, FRACTURES): VITD: 23.43 ng/mL — ABNORMAL LOW (ref 30.00–100.00)

## 2018-11-12 NOTE — Progress Notes (Signed)
BP 136/86 (BP Location: Left Arm, Patient Position: Sitting, Cuff Size: Normal)   Pulse 73   Temp 97.8 F (36.6 C) (Oral)   Ht 5' 7.5" (1.715 m)   Wt 190 lb 8 oz (86.4 kg)   SpO2 99%   BMI 29.40 kg/m    CC: CPE Subjective:    Patient ID: Demetrio Leighty, male    DOB: [birth year], 47 y.o.   MRN: [id number]  HPI: Jere Bostrom is a 47 y.o. male presenting on 11/13/2018 for Annual Exam (Pt stopped vit D, stating it made his stomach hurt.)   Vitamin D was stopped - caused abd discomfort.  Notes L chest bone enlarged for years.   Preventative: fmhx prostate cancer 34s. Declines flu shot. [id number] Seat belt use discussed. Sunscreen use discussed.No changing moles on skin.  Non smoker Alcohol - none Dentist q6 mo Eye exam - has not seen.   Caffeine: occasional sodas  Lives with mom, and dad but has his own place, 4 dogs  Occupation: works at [employer]  Activity: hunts with dogs 3x/wk, tries to walk 1 mi/day  Diet: good water, fruits/vegetables daily      Relevant past medical, surgical, family and social history reviewed and updated as indicated. Interim medical history since our last visit reviewed. Allergies and medications reviewed and updated. Outpatient Medications Prior to Visit  Medication Sig Dispense Refill  . amLODipine (NORVASC) 10 MG tablet TAKE 1 TABLET BY MOUTH EVERY DAY 90 tablet 1  . fenofibrate (TRICOR) 145 MG tablet TAKE 1 TABLET (145 MG TOTAL) BY MOUTH DAILY. 90 tablet 0  . metoprolol succinate (TOPROL-XL) 100 MG 24 hr tablet TAKE 1 TABLET (100 MG TOTAL) BY MOUTH DAILY. 90 tablet 2  . Cholecalciferol (VITAMIN D) 2000 units CAPS Take 1 capsule (2,000 Units total) daily by mouth. (Patient not taking: Reported on 11/13/2018) 30 capsule    No facility-administered medications prior to visit.      Per HPI unless specifically indicated in ROS section below Review of Systems  Constitutional: Negative for activity change, appetite change,  chills, fatigue, fever and unexpected weight change.  HENT: Negative for hearing loss.   Eyes: Negative for visual disturbance.  Respiratory: Negative for cough, chest tightness, shortness of breath and wheezing.   Cardiovascular: Negative for chest pain, palpitations and leg swelling.  Gastrointestinal: Negative for abdominal distention, abdominal pain, blood in stool, constipation, diarrhea, nausea and vomiting.  Genitourinary: Negative for difficulty urinating and hematuria.  Musculoskeletal: Negative for arthralgias, myalgias and neck pain.  Skin: Negative for rash.  Neurological: Negative for dizziness, seizures, syncope and headaches.  Hematological: Negative for adenopathy. Does not bruise/bleed easily.  Psychiatric/Behavioral: Negative for dysphoric mood. The patient is not nervous/anxious.    Objective:    BP 136/86 (BP Location: Left Arm, Patient Position: Sitting, Cuff Size: Normal)   Pulse 73   Temp 97.8 F (36.6 C) (Oral)   Ht 5' 7.5" (1.715 m)   Wt 190 lb 8 oz (86.4 kg)   SpO2 99%   BMI 29.40 kg/m   Wt Readings from Last 3 Encounters:  11/13/18 190 lb 8 oz (86.4 kg)  07/01/18 191 lb 4 oz (86.8 kg)  12/13/17 190 lb (86.2 kg)    Physical Exam Vitals signs and nursing note reviewed.  Constitutional:      General: He is not in acute distress.    Appearance: He is well-developed.  HENT:     Head: Normocephalic and atraumatic.  Right Ear: Hearing, tympanic membrane, ear canal and external ear normal.     Left Ear: Hearing, tympanic membrane, ear canal and external ear normal.     Nose: Nose normal.     Mouth/Throat:     Pharynx: Uvula midline. No oropharyngeal exudate or posterior oropharyngeal erythema.  Eyes:     General: No scleral icterus.    Conjunctiva/sclera: Conjunctivae normal.     Pupils: Pupils are equal, round, and reactive to light.  Neck:     Musculoskeletal: Normal range of motion and neck supple.  Cardiovascular:     Rate and Rhythm: Normal  rate and regular rhythm.     Pulses:          Radial pulses are 2+ on the right side and 2+ on the left side.     Heart sounds: Normal heart sounds. No murmur.  Pulmonary:     Effort: Pulmonary effort is normal. No respiratory distress.     Breath sounds: Normal breath sounds. No wheezing or rales.  Abdominal:     General: Bowel sounds are normal. There is no distension.     Palpations: Abdomen is soft. There is no mass.     Tenderness: There is no abdominal tenderness. There is no guarding or rebound.  Musculoskeletal: Normal range of motion.  Lymphadenopathy:     Cervical: No cervical adenopathy.  Skin:    General: Skin is warm and dry.     Findings: No rash.  Neurological:     Mental Status: He is alert and oriented to person, place, and time.     Comments: CN grossly intact, station and gait intact  Psychiatric:        Behavior: Behavior normal.        Thought Content: Thought content normal.        Judgment: Judgment normal.       Results for orders placed or performed in visit on 11/06/18  Microalbumin / creatinine urine ratio  Result Value Ref Range   Microalb, Ur <0.7 0.0 - 1.9 mg/dL   Creatinine,U 161.5 mg/dL   Microalb Creat Ratio 0.4 0.0 - 30.0 mg/g  TSH  Result Value Ref Range   TSH 3.74 0.35 - 4.50 uIU/mL  Comprehensive metabolic panel  Result Value Ref Range   Sodium 139 135 - 145 mEq/L   Potassium 3.7 3.5 - 5.1 mEq/L   Chloride 102 96 - 112 mEq/L   CO2 28 19 - 32 mEq/L   Glucose, Bld 95 70 - 99 mg/dL   BUN 25 (H) 6 - 23 mg/dL   Creatinine, Ser 1.59 (H) 0.40 - 1.50 mg/dL   Total Bilirubin 0.7 0.2 - 1.2 mg/dL   Alkaline Phosphatase 39 39 - 117 U/L   AST 31 0 - 37 U/L   ALT 31 0 - 53 U/L   Total Protein 8.4 (H) 6.0 - 8.3 g/dL   Albumin 4.6 3.5 - 5.2 g/dL   Calcium 9.8 8.4 - 10.5 mg/dL   GFR 60.58 >60.00 mL/min  Lipid panel  Result Value Ref Range   Cholesterol 118 0 - 200 mg/dL   Triglycerides 85.0 0.0 - 149.0 mg/dL   HDL 38.80 (L) >39.00 mg/dL    VLDL 17.0 0.0 - 40.0 mg/dL   LDL Cholesterol 62 0 - 99 mg/dL   Total CHOL/HDL Ratio 3    NonHDL 78.98   VITAMIN D 25 Hydroxy (Vit-D Deficiency, Fractures)  Result Value Ref Range   VITD 23.43 (L) 30.00 - 100.00  ng/mL   Assessment & Plan:   Problem List Items Addressed This Visit    Vitamin D deficiency    Has not tolerated 2000 IU daily - suggested 1-2x/wk or finding 400 IU dose.       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 Check films today.       Relevant Orders   DG Chest 2 View   Hypertriglyceridemia    Chronic, stable on tricor - continue. The ASCVD Risk score Mikey Bussing DC Jr., et al., 2013) failed to calculate for the following reasons:   The valid total cholesterol range is 130 to 320 mg/dL       Relevant Medications   amLODipine (NORVASC) 10 MG tablet   fenofibrate (TRICOR) 145 MG tablet   metoprolol succinate (TOPROL-XL) 100 MG 24 hr tablet   HTN (hypertension)    Chronic, stable. Continue current regimen.       Relevant Medications   amLODipine (NORVASC) 10 MG tablet   fenofibrate (TRICOR) 145 MG tablet   metoprolol succinate (TOPROL-XL) 100 MG 24 hr tablet   Healthcare maintenance - Primary    Preventative protocols reviewed and updated unless pt declined. Discussed healthy diet and lifestyle.           Meds ordered this encounter  Medications  . DISCONTD: fenofibrate (TRICOR) 145 MG tablet    Sig: Take 1 tablet (145 mg total) by mouth daily.    Dispense:  90 tablet    Refill:  1  . DISCONTD: amLODipine (NORVASC) 10 MG tablet    Sig: Take 1 tablet (10 mg total) by mouth daily.    Dispense:  90 tablet    Refill:  1  . DISCONTD: metoprolol succinate (TOPROL-XL) 100 MG 24 hr tablet    Sig: Take 1 tablet (100 mg total) by mouth daily. Take with or immediately following a meal.    Dispense:  90 tablet    Refill:  1  . Vitamin D, Cholecalciferol, 10 MCG (400 UNIT) CAPS    Sig: Take 1 capsule  by mouth daily.    Dispense:  150 capsule  . amLODipine (NORVASC) 10 MG tablet    Sig: Take 1 tablet (10 mg total) by mouth daily.    Dispense:  90 tablet    Refill:  3  . fenofibrate (TRICOR) 145 MG tablet    Sig: Take 1 tablet (145 mg total) by mouth daily.    Dispense:  90 tablet    Refill:  3  . metoprolol succinate (TOPROL-XL) 100 MG 24 hr tablet    Sig: Take 1 tablet (100 mg total) by mouth daily. Take with or immediately following a meal.    Dispense:  90 tablet    Refill:  3   Orders Placed This Encounter  Procedures  . DG Chest 2 View    Standing Status:   Future    Number of Occurrences:   1    Standing Expiration Date:   01/15/2020    Order Specific Question:   Reason for Exam (SYMPTOM  OR DIAGNOSIS REQUIRED)    Answer:   L costochondral enlargement    Order Specific Question:   Preferred imaging location?    Answer:   [facility]    Order Specific Question:   Radiology Contrast Protocol - do NOT remove file path    Answer:   [email or web address]    Follow up plan: Return in about 1 year (  around 11/14/2019) for annual exam, prior fasting for blood work.  Ria Bush, MD

## 2018-11-12 NOTE — Assessment & Plan Note (Signed)
Preventative protocols reviewed and updated unless pt declined. Discussed healthy diet and lifestyle.  

## 2018-11-13 ENCOUNTER — Ambulatory Visit (INDEPENDENT_AMBULATORY_CARE_PROVIDER_SITE_OTHER)
Admission: RE | Admit: 2018-11-13 | Discharge: 2018-11-13 | Disposition: A | Discharge status: Home / Self Care | Payer: BLUE CROSS/BLUE SHIELD | Source: Ambulatory Visit | Attending: Family Medicine | Admitting: Family Medicine

## 2018-11-13 ENCOUNTER — Ambulatory Visit (INDEPENDENT_AMBULATORY_CARE_PROVIDER_SITE_OTHER): Payer: BLUE CROSS/BLUE SHIELD | Admitting: Family Medicine

## 2018-11-13 ENCOUNTER — Encounter: Payer: Self-pay | Admitting: Family Medicine

## 2018-11-13 VITALS — BP 136/86 | HR 73 | Temp 97.8°F | Ht 67.5 in | Wt 190.5 lb

## 2018-11-13 DIAGNOSIS — I10 Essential (primary) hypertension: Secondary | ICD-10-CM | POA: Diagnosis not present

## 2018-11-13 DIAGNOSIS — E559 Vitamin D deficiency, unspecified: Secondary | ICD-10-CM

## 2018-11-13 DIAGNOSIS — E781 Pure hyperglyceridemia: Secondary | ICD-10-CM | POA: Diagnosis not present

## 2018-11-13 DIAGNOSIS — R222 Localized swelling, mass and lump, trunk: Secondary | ICD-10-CM

## 2018-11-13 DIAGNOSIS — Z0000 Encounter for general adult medical examination without abnormal findings: Secondary | ICD-10-CM

## 2018-11-13 MED ORDER — VITAMIN D (CHOLECALCIFEROL) 10 MCG (400 UNIT) PO CAPS: 1.0000 | ORAL_CAPSULE | Freq: Every day | ORAL | Status: DC

## 2018-11-13 MED ORDER — METOPROLOL SUCCINATE ER 100 MG PO TB24: 100.0000 mg | ORAL_TABLET | Freq: Every day | ORAL | 1 refills | Status: DC

## 2018-11-13 MED ORDER — FENOFIBRATE 145 MG PO TABS: 145.0000 mg | ORAL_TABLET | Freq: Every day | ORAL | 3 refills | Status: DC

## 2018-11-13 MED ORDER — FENOFIBRATE 145 MG PO TABS: 145.0000 mg | ORAL_TABLET | Freq: Every day | ORAL | 1 refills | Status: DC

## 2018-11-13 MED ORDER — AMLODIPINE BESYLATE 10 MG PO TABS: 10.0000 mg | ORAL_TABLET | Freq: Every day | ORAL | 3 refills | Status: DC

## 2018-11-13 MED ORDER — METOPROLOL SUCCINATE ER 100 MG PO TB24: 100.0000 mg | ORAL_TABLET | Freq: Every day | ORAL | 3 refills | Status: DC

## 2018-11-13 MED ORDER — AMLODIPINE BESYLATE 10 MG PO TABS: 10.0000 mg | ORAL_TABLET | Freq: Every day | ORAL | 1 refills | Status: DC

## 2018-11-13 NOTE — Assessment & Plan Note (Signed)
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 Check films today.

## 2018-11-13 NOTE — Patient Instructions (Addendum)
Look for lower dose vitamin D.  You are doing well today.  Return in 1 year for next physical. xrays today of chest wall.   Health Maintenance, Male A healthy lifestyle and preventive care is important for your health and wellness. Ask your health care provider about what schedule of regular examinations is right for you. What should I know about weight and diet? Eat a Healthy Diet  Eat plenty of vegetables, fruits, whole grains, low-fat dairy products, and lean protein.  Do not eat a lot of foods high in solid fats, added sugars, or salt.  Maintain a Healthy Weight Regular exercise can help you achieve or maintain a healthy weight. You should:  Do at least 150 minutes of exercise each week. The exercise should increase your heart rate and make you sweat (moderate-intensity exercise).  Do strength-training exercises at least twice a week. Watch Your Levels of Cholesterol and Blood Lipids  Have your blood tested for lipids and cholesterol every 5 years starting at 46 years of age. If you are at high risk for heart disease, you should start having your blood tested when you are 46 years old. You may need to have your cholesterol levels checked more often if: ? Your lipid or cholesterol levels are high. ? You are older than 46 years of age. ? You are at high risk for heart disease. What should I know about cancer screening? Many types of cancers can be detected early and may often be prevented. Lung Cancer  You should be screened every year for lung cancer if: ? You are a current smoker who has smoked for at least 30 years. ? You are a former smoker who has quit within the past 15 years.  Talk to your health care provider about your screening options, when you should start screening, and how often you should be screened. Colorectal Cancer  Routine colorectal cancer screening usually begins at 46 years of age and should be repeated every 5-10 years until you are 46 years old. You may  need to be screened more often if early forms of precancerous polyps or small growths are found. Your health care provider may recommend screening at an earlier age if you have risk factors for colon cancer.  Your health care provider may recommend using home test kits to check for hidden blood in the stool.  A small camera at the end of a tube can be used to examine your colon (sigmoidoscopy or colonoscopy). This checks for the earliest forms of colorectal cancer. Prostate and Testicular Cancer  Depending on your age and overall health, your health care provider may do certain tests to screen for prostate and testicular cancer.  Talk to your health care provider about any symptoms or concerns you have about testicular or prostate cancer. Skin Cancer  Check your skin from head to toe regularly.  Tell your health care provider about any new moles or changes in moles, especially if: ? There is a change in a mole's size, shape, or color. ? You have a mole that is larger than a pencil eraser.  Always use sunscreen. Apply sunscreen liberally and repeat throughout the day.  Protect yourself by wearing long sleeves, pants, a wide-brimmed hat, and sunglasses when outside. What should I know about heart disease, diabetes, and high blood pressure?  If you are 78-23 years of age, have your blood pressure checked every 3-5 years. If you are 56 years of age or older, have your blood pressure checked  every year. You should have your blood pressure measured twice-once when you are at a hospital or clinic, and once when you are not at a hospital or clinic. Record the average of the two measurements. To check your blood pressure when you are not at a hospital or clinic, you can use: ? An automated blood pressure machine at a pharmacy. ? A home blood pressure monitor.  Talk to your health care provider about your target blood pressure.  If you are between 73-57 years old, ask your health care provider if  you should take aspirin to prevent heart disease.  Have regular diabetes screenings by checking your fasting blood sugar level. ? If you are at a normal weight and have a low risk for diabetes, have this test once every three years after the age of 61. ? If you are overweight and have a high risk for diabetes, consider being tested at a younger age or more often.  A one-time screening for abdominal aortic aneurysm (AAA) by ultrasound is recommended for men aged 70-75 years who are current or former smokers. What should I know about preventing infection? Hepatitis B If you have a higher risk for hepatitis B, you should be screened for this virus. Talk with your health care provider to find out if you are at risk for hepatitis B infection. Hepatitis C Blood testing is recommended for:  Everyone born from [birth year] through 1965.  Anyone with known risk factors for hepatitis C. Sexually Transmitted Diseases (STDs)  You should be screened each year for STDs including gonorrhea and chlamydia if: ? You are sexually active and are younger than 46 years of age. ? You are older than 46 years of age and your health care provider tells you that you are at risk for this type of infection. ? Your sexual activity has changed since you were last screened and you are at an increased risk for chlamydia or gonorrhea. Ask your health care provider if you are at risk.  Talk with your health care provider about whether you are at high risk of being infected with HIV. Your health care provider may recommend a prescription medicine to help prevent HIV infection. What else can I do?  Schedule regular health, dental, and eye exams.  Stay current with your vaccines (immunizations).  Do not use any tobacco products, such as cigarettes, chewing tobacco, and e-cigarettes. If you need help quitting, ask your health care provider.  Limit alcohol intake to no more than 2 drinks per day. One drink equals 12 ounces of beer,  5 ounces of wine, or 1 ounces of hard liquor.  Do not use street drugs.  Do not share needles.  Ask your health care provider for help if you need support or information about quitting drugs.  Tell your health care provider if you often feel depressed.  Tell your health care provider if you have ever been abused or do not feel safe at home. This information is not intended to replace advice given to you by your health care provider. Make sure you discuss any questions you have with your health care provider. Document Released: 05/03/2008 Document Revised: 07/04/2016 Document Reviewed: 08/09/2015 Elsevier Interactive Patient Education  2019 [employer].

## 2018-11-13 NOTE — Assessment & Plan Note (Signed)
Chronic, stable. Continue current regimen. 

## 2018-11-13 NOTE — Assessment & Plan Note (Signed)
Chronic, stable on tricor - continue. The ASCVD Risk score Mikey Bussing DC Jr., et al., 2013) failed to calculate for the following reasons:   The valid total cholesterol range is 130 to 320 mg/dL

## 2018-11-13 NOTE — Assessment & Plan Note (Signed)
Has not tolerated 2000 IU daily - suggested 1-2x/wk or finding 400 IU dose.

## 2019-02-07 IMAGING — DX DG CHEST 2V
2 series · 2 of 2 positions shown · non-contrast
Comparison: None.

CLINICAL DATA: Left costochondral enlargement

EXAM:
CHEST - 2 VIEW

[chest pa]
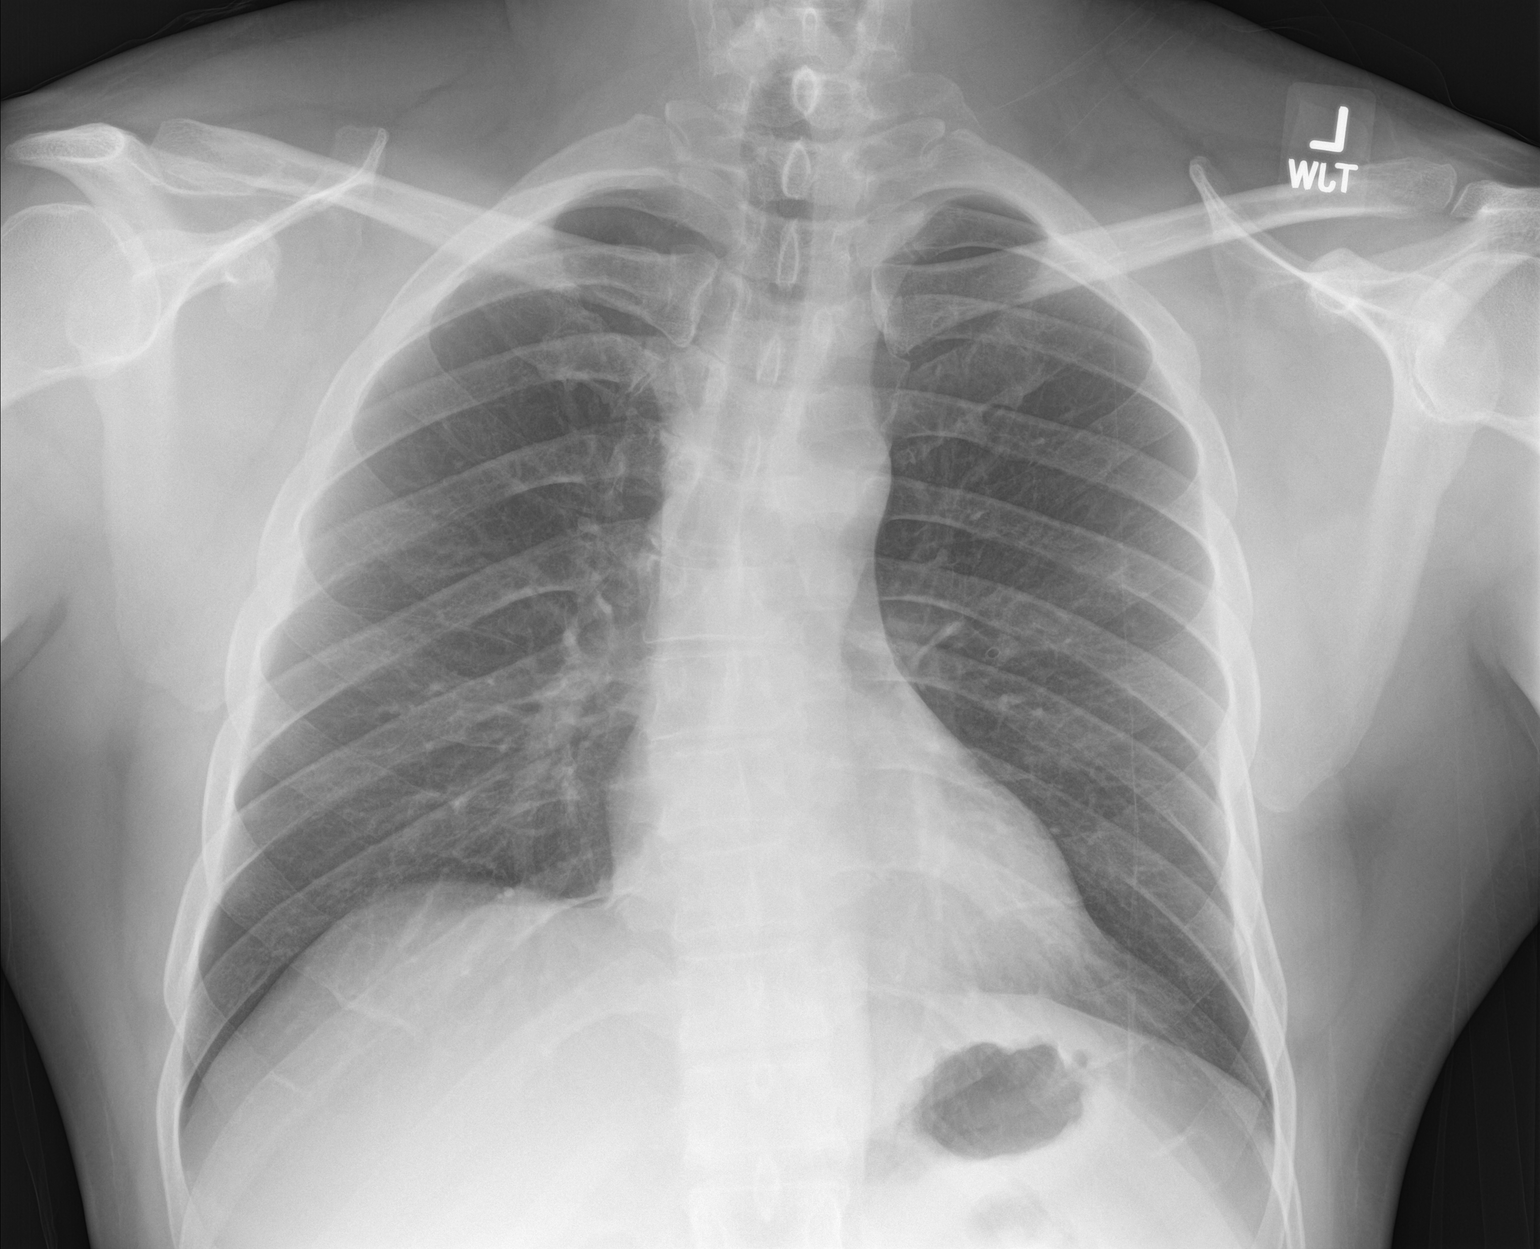

[chest lat]
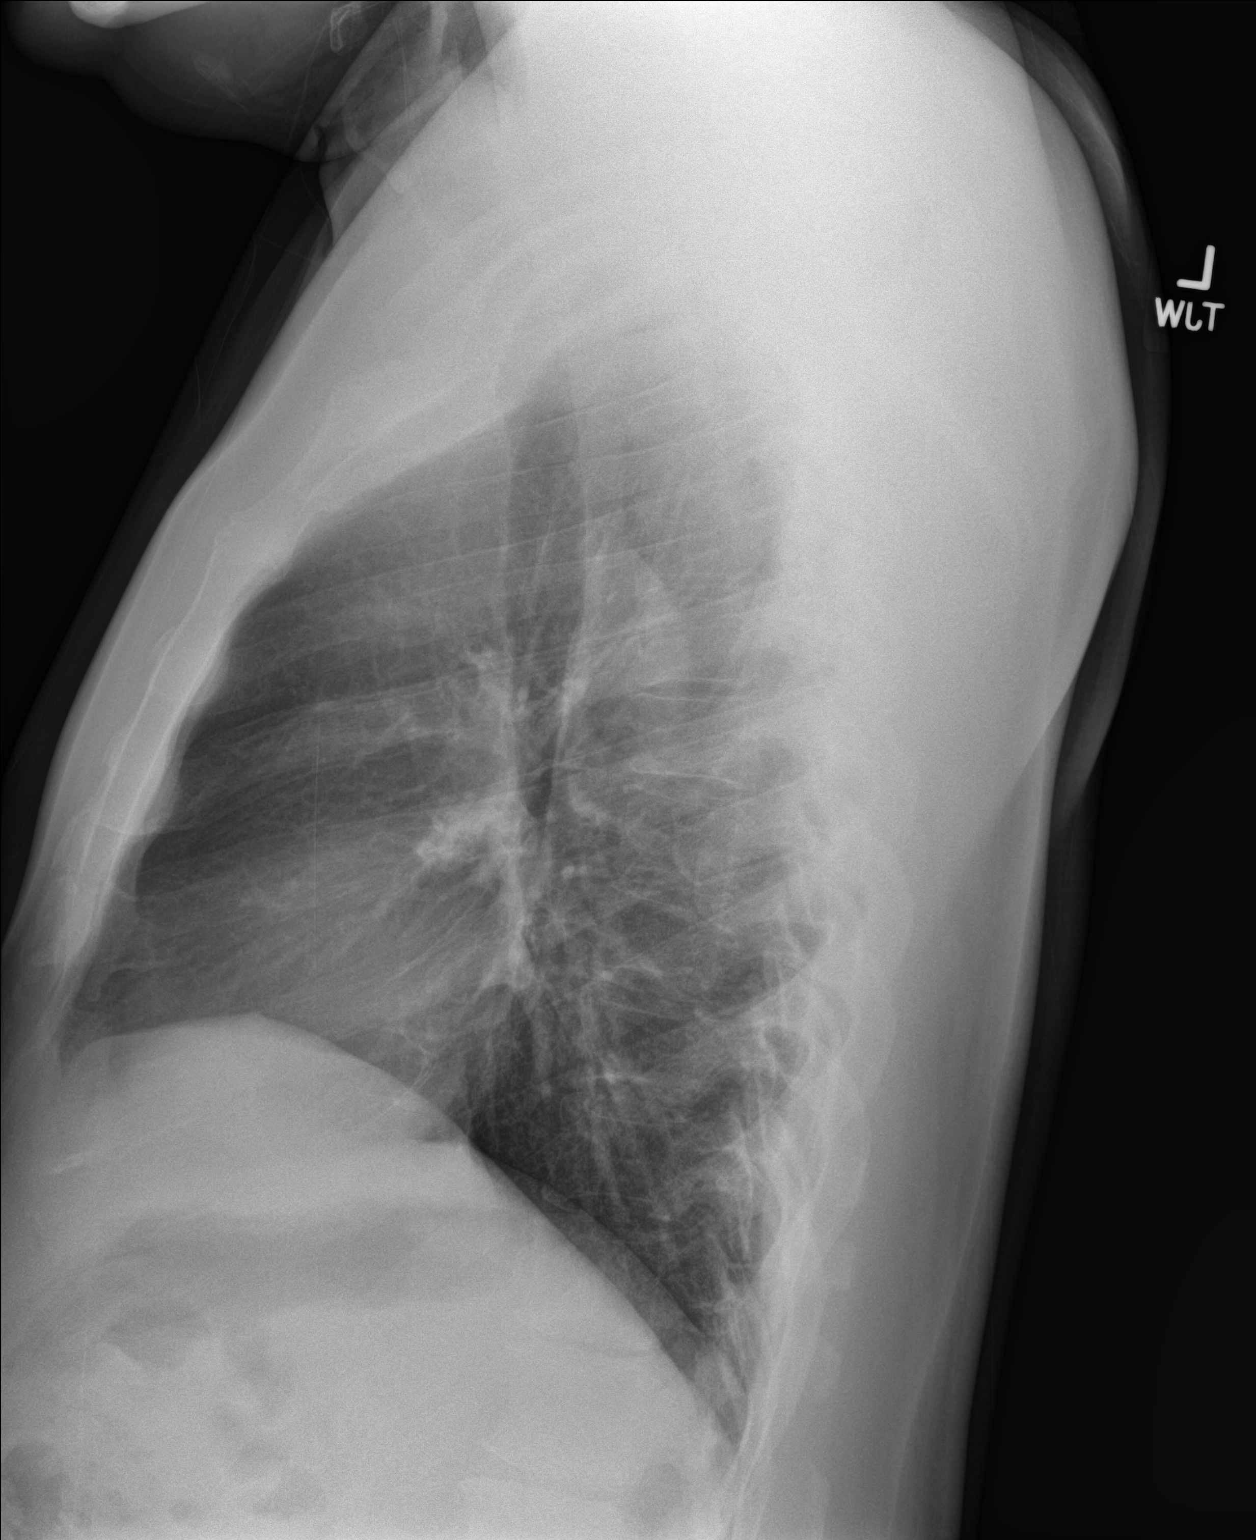

[2 of 2 positions shown; findings below may reference images not displayed]

FINDINGS: The heart size and mediastinal contours are within normal limits.
Both lungs are clear. The visualized skeletal structures are
unremarkable.
IMPRESSION: No active cardiopulmonary disease.

## 2019-11-06 ENCOUNTER — Telehealth: Payer: Self-pay

## 2019-11-06 NOTE — Telephone Encounter (Signed)
LVM w COVID screen, front door and back lab info 12.18.2020 TLJ

## 2019-11-10 ENCOUNTER — Other Ambulatory Visit: Payer: Self-pay | Admitting: Family Medicine

## 2019-11-10 ENCOUNTER — Other Ambulatory Visit (INDEPENDENT_AMBULATORY_CARE_PROVIDER_SITE_OTHER): Payer: BC Managed Care – PPO

## 2019-11-10 ENCOUNTER — Other Ambulatory Visit: Payer: Self-pay

## 2019-11-10 DIAGNOSIS — E559 Vitamin D deficiency, unspecified: Secondary | ICD-10-CM | POA: Diagnosis not present

## 2019-11-10 DIAGNOSIS — E781 Pure hyperglyceridemia: Secondary | ICD-10-CM

## 2019-11-10 DIAGNOSIS — I10 Essential (primary) hypertension: Secondary | ICD-10-CM | POA: Diagnosis not present

## 2019-11-10 DIAGNOSIS — R748 Abnormal levels of other serum enzymes: Secondary | ICD-10-CM

## 2019-11-10 LAB — COMPREHENSIVE METABOLIC PANEL
ALT: 27 U/L (ref 0–53)
AST: 25 U/L (ref 0–37)
Albumin: 4.4 g/dL (ref 3.5–5.2)
Alkaline Phosphatase: 43 U/L (ref 39–117)
BUN: 20 mg/dL (ref 6–23)
CO2: 29 mEq/L (ref 19–32)
Calcium: 9.9 mg/dL (ref 8.4–10.5)
Chloride: 102 mEq/L (ref 96–112)
Creatinine, Ser: 1.57 mg/dL — ABNORMAL HIGH (ref 0.40–1.50)
GFR: 57.58 mL/min — ABNORMAL LOW (ref >60.00)
Glucose, Bld: 83 mg/dL (ref 70–99)
Potassium: 4 mEq/L (ref 3.5–5.1)
Sodium: 140 mEq/L (ref 135–145)
Total Bilirubin: 0.7 mg/dL (ref 0.2–1.2)
Total Protein: 7.7 g/dL (ref 6.0–8.3)

## 2019-11-10 LAB — LIPID PANEL
Cholesterol: 115 mg/dL (ref 0–200)
HDL: 33.4 mg/dL — ABNORMAL LOW (ref >39.00)
LDL Cholesterol: 60 mg/dL (ref 0–99)
NonHDL: 81.46
Total CHOL/HDL Ratio: 3
Triglycerides: 108 mg/dL (ref 0.0–149.0)
VLDL: 21.6 mg/dL (ref 0.0–40.0)

## 2019-11-10 LAB — VITAMIN D 25 HYDROXY (VIT D DEFICIENCY, FRACTURES): VITD: 22.76 ng/mL — ABNORMAL LOW (ref 30.00–100.00)

## 2019-11-10 LAB — MICROALBUMIN / CREATININE URINE RATIO
Creatinine,U: 211.5 mg/dL
Microalb Creat Ratio: 0.3 mg/g (ref 0.0–30.0)
Microalb, Ur: <0.7 mg/dL (ref 0.0–1.9)

## 2019-11-17 ENCOUNTER — Other Ambulatory Visit: Payer: Self-pay

## 2019-11-17 ENCOUNTER — Encounter: Payer: Self-pay | Admitting: Family Medicine

## 2019-11-17 ENCOUNTER — Ambulatory Visit (INDEPENDENT_AMBULATORY_CARE_PROVIDER_SITE_OTHER): Payer: BC Managed Care – PPO | Admitting: Family Medicine

## 2019-11-17 VITALS — BP 142/92 | HR 88 | Temp 97.5°F | Ht 67.75 in | Wt 183.3 lb

## 2019-11-17 DIAGNOSIS — Z0000 Encounter for general adult medical examination without abnormal findings: Secondary | ICD-10-CM

## 2019-11-17 DIAGNOSIS — Z125 Encounter for screening for malignant neoplasm of prostate: Secondary | ICD-10-CM

## 2019-11-17 DIAGNOSIS — R0683 Snoring: Secondary | ICD-10-CM

## 2019-11-17 DIAGNOSIS — R7989 Other specified abnormal findings of blood chemistry: Secondary | ICD-10-CM

## 2019-11-17 DIAGNOSIS — E559 Vitamin D deficiency, unspecified: Secondary | ICD-10-CM

## 2019-11-17 DIAGNOSIS — R634 Abnormal weight loss: Secondary | ICD-10-CM

## 2019-11-17 DIAGNOSIS — E781 Pure hyperglyceridemia: Secondary | ICD-10-CM

## 2019-11-17 DIAGNOSIS — I10 Essential (primary) hypertension: Secondary | ICD-10-CM

## 2019-11-17 LAB — CBC WITH DIFFERENTIAL/PLATELET
Basophils Absolute: 0 10*3/uL (ref 0.0–0.1)
Basophils Relative: 0.7 % (ref 0.0–3.0)
Eosinophils Absolute: 0.2 10*3/uL (ref 0.0–0.7)
Eosinophils Relative: 3 % (ref 0.0–5.0)
HCT: 44.4 % (ref 39.0–52.0)
Hemoglobin: 15.1 g/dL (ref 13.0–17.0)
Lymphocytes Relative: 39 % (ref 12.0–46.0)
Lymphs Abs: 2.1 10*3/uL (ref 0.7–4.0)
MCHC: 34 g/dL (ref 30.0–36.0)
MCV: 88.4 fl (ref 78.0–100.0)
Monocytes Absolute: 0.5 10*3/uL (ref 0.1–1.0)
Monocytes Relative: 8.4 % (ref 3.0–12.0)
Neutro Abs: 2.6 10*3/uL (ref 1.4–7.7)
Neutrophils Relative %: 48.9 % (ref 43.0–77.0)
Platelets: 314 10*3/uL (ref 150.0–400.0)
RBC: 5.02 Mil/uL (ref 4.22–5.81)
RDW: 13.9 % (ref 11.5–15.5)
WBC: 5.4 10*3/uL (ref 4.0–10.5)

## 2019-11-17 LAB — PSA: PSA: 0.44 ng/mL (ref 0.10–4.00)

## 2019-11-17 MED ORDER — VITAMIN D3 25 MCG (1000 UT) PO CAPS: 1.0000 | ORAL_CAPSULE | Freq: Every day | ORAL | Status: DC

## 2019-11-17 NOTE — Progress Notes (Signed)
This visit was conducted in person.  BP (!) 142/92 (BP Location: Right Arm, Patient Position: Sitting, Cuff Size: Large)   Pulse 88   Temp (!) 97.5 F (36.4 C) (Temporal)   Ht 5' 7.75" (1.721 m)   Wt 183 lb 5 oz (83.2 kg)   SpO2 98%   BMI 28.08 kg/m    CC: CPE Subjective:    Patient ID: Shawn Vaughn, male    DOB: 01-23-1971, 48 y.o.   MRN: [id number]  HPI: Shawn Vaughn is a 48 y.o. male presenting on 11/17/2019 for Annual Exam   Hasn't taking BP meds yet.   Tick bite last month L upper arm - still healing.   Saw dentist who was concerned for OSA - he endorses snoring, has large tonsils, constricted arch, large tongue per dentist, endorses acid reflux. No witnessed apneic episodes. No PNDyspnea. Endorses restorative sleep. No daytime sleepiness.   Occasional fatigue, malaise every 1-2 months - this has improved since he fixed exhaust in truck. 7 lb weight loss in the last year. Overall good appetite, no night sweats.  Preventative: Colon cancer screening - discussed options. Would like cologuard even if has to pay out of pocket.  fmhx prostate cancer 16s. Check PSA/DRE today.  Flu shot yearly [id number] Seat belt use discussed. Sunscreen use discussed.No changing moles on skin.  Non smoker Alcohol - none Dentist q6 mo  Eye exam - has not seen.   Caffeine: occasional sodas  Lives with mom, and dad but has his own place, 4 dogs  Occupation: works at [employer]  Activity: hunts with dogs 3x/wk, tries to walk 1 mi/day  Diet: good water, fruits/vegetables daily     Relevant past medical, surgical, family and social history reviewed and updated as indicated. Interim medical history since our last visit reviewed. Allergies and medications reviewed and updated. Outpatient Medications Prior to Visit  Medication Sig Dispense Refill  . amLODipine (NORVASC) 10 MG tablet Take 1 tablet (10 mg total) by mouth daily. 90 tablet 3  . fenofibrate (TRICOR) 145 MG  tablet Take 1 tablet (145 mg total) by mouth daily. 90 tablet 3  . metoprolol succinate (TOPROL-XL) 100 MG 24 hr tablet Take 1 tablet (100 mg total) by mouth daily. Take with or immediately following a meal. 90 tablet 3  . Vitamin D, Cholecalciferol, 10 MCG (400 UNIT) CAPS Take 1 capsule by mouth daily. (Patient not taking: Reported on 11/17/2019) 150 capsule    No facility-administered medications prior to visit.     Per HPI unless specifically indicated in ROS section below Review of Systems  Constitutional: Negative for activity change, appetite change, chills, fatigue, fever and unexpected weight change.  HENT: Negative for hearing loss.   Eyes: Negative for visual disturbance.  Respiratory: Negative for cough, chest tightness, shortness of breath and wheezing.   Cardiovascular: Negative for chest pain, palpitations and leg swelling.  Gastrointestinal: Negative for abdominal distention, abdominal pain, blood in stool, constipation, diarrhea, nausea and vomiting.  Genitourinary: Negative for difficulty urinating and hematuria.  Musculoskeletal: Negative for arthralgias, myalgias and neck pain.  Skin: Negative for rash.  Neurological: Negative for dizziness, seizures, syncope and headaches.  Hematological: Negative for adenopathy. Does not bruise/bleed easily.  Psychiatric/Behavioral: Negative for dysphoric mood. The patient is not nervous/anxious.    Objective:    BP (!) 142/92 (BP Location: Right Arm, Patient Position: Sitting, Cuff Size: Large)   Pulse 88   Temp (!) 97.5 F (36.4 C) (Temporal)  Ht 5' 7.75" (1.721 m)   Wt 183 lb 5 oz (83.2 kg)   SpO2 98%   BMI 28.08 kg/m   Wt Readings from Last 3 Encounters:  11/17/19 183 lb 5 oz (83.2 kg)  11/13/18 190 lb 8 oz (86.4 kg)  07/01/18 191 lb 4 oz (86.8 kg)    Physical Exam Vitals and nursing note reviewed.  Constitutional:      General: He is not in acute distress.    Appearance: Normal appearance. He is well-developed. He  is not ill-appearing.  HENT:     Head: Normocephalic and atraumatic.     Right Ear: Hearing, tympanic membrane, ear canal and external ear normal.     Left Ear: Hearing, tympanic membrane, ear canal and external ear normal.     Mouth/Throat:     Pharynx: Uvula midline.  Eyes:     General: No scleral icterus.    Extraocular Movements: Extraocular movements intact.     Conjunctiva/sclera: Conjunctivae normal.     Pupils: Pupils are equal, round, and reactive to light.  Cardiovascular:     Rate and Rhythm: Normal rate and regular rhythm.     Pulses: Normal pulses.          Radial pulses are 2+ on the right side and 2+ on the left side.     Heart sounds: Normal heart sounds. No murmur.  Pulmonary:     Effort: Pulmonary effort is normal. No respiratory distress.     Breath sounds: Normal breath sounds. No wheezing, rhonchi or rales.  Abdominal:     General: Abdomen is flat. Bowel sounds are normal. There is no distension.     Palpations: Abdomen is soft. There is no mass.     Tenderness: There is no abdominal tenderness. There is no guarding or rebound.     Hernia: No hernia is present.  Genitourinary:    Prostate: Normal. Not enlarged (20gm), not tender and no nodules present.     Rectum: Normal. No mass, tenderness, anal fissure, external hemorrhoid or internal hemorrhoid. Normal anal tone.  Musculoskeletal:        General: Normal range of motion.     Cervical back: Normal range of motion and neck supple.     Right lower leg: No edema.     Left lower leg: No edema.  Lymphadenopathy:     Cervical: No cervical adenopathy.  Skin:    General: Skin is warm and dry.     Findings: No rash.  Neurological:     General: No focal deficit present.     Mental Status: He is alert and oriented to person, place, and time.     Comments: CN grossly intact, station and gait intact  Psychiatric:        Mood and Affect: Mood normal.        Behavior: Behavior normal.        Thought Content:  Thought content normal.        Judgment: Judgment normal.       Results for orders placed or performed in visit on 11/10/19  Microalbumin / creatinine urine ratio  Result Value Ref Range   Microalb, Ur <0.7 0.0 - 1.9 mg/dL   Creatinine,U 211.5 mg/dL   Microalb Creat Ratio 0.3 0.0 - 30.0 mg/g  Comprehensive metabolic panel  Result Value Ref Range   Sodium 140 135 - 145 mEq/L   Potassium 4.0 3.5 - 5.1 mEq/L   Chloride 102 96 - 112 mEq/L  CO2 29 19 - 32 mEq/L   Glucose, Bld 83 70 - 99 mg/dL   BUN 20 6 - 23 mg/dL   Creatinine, Ser 1.57 (H) 0.40 - 1.50 mg/dL   Total Bilirubin 0.7 0.2 - 1.2 mg/dL   Alkaline Phosphatase 43 39 - 117 U/L   AST 25 0 - 37 U/L   ALT 27 0 - 53 U/L   Total Protein 7.7 6.0 - 8.3 g/dL   Albumin 4.4 3.5 - 5.2 g/dL   GFR 57.58 (L) >60.00 mL/min   Calcium 9.9 8.4 - 10.5 mg/dL  Lipid panel  Result Value Ref Range   Cholesterol 115 0 - 200 mg/dL   Triglycerides 108.0 0.0 - 149.0 mg/dL   HDL 33.40 (L) >39.00 mg/dL   VLDL 21.6 0.0 - 40.0 mg/dL   LDL Cholesterol 60 0 - 99 mg/dL   Total CHOL/HDL Ratio 3    NonHDL 81.46   vit d  Result Value Ref Range   VITD 22.76 (L) 30.00 - 100.00 ng/mL  No results found for: PSA1, PSA   Assessment & Plan:  This visit occurred during the SARS-CoV-2 public health emergency.  Safety protocols were in place, including screening questions prior to the visit, additional usage of staff PPE, and extensive cleaning of exam room while observing appropriate contact time as indicated for disinfecting solutions.   Problem List Items Addressed This Visit    Vitamin D deficiency    Has not been taking vit D replacement. rec restart 1000 IU daily.       Snoring    Dentist was concerned for possible OSA. ESS today = 3.  Will monitor for now.       Hypertriglyceridemia    Chronic, stable continue tricor. The ASCVD Risk score Mikey Bussing DC Jr., et al., 2013) failed to calculate for the following reasons:   The valid total cholesterol  range is 130 to 320 mg/dL       HTN (hypertension)    Chronic, stable. Continue current regimen. Has not taken BP meds yet this morning.       Healthcare maintenance - Primary    Preventative protocols reviewed and updated unless pt declined. Discussed healthy diet and lifestyle.       Elevated serum creatinine    Chronic, stable. Continue to monitor with tight BP control. No microalbuminuria.        Other Visit Diagnoses    Special screening for malignant neoplasm of prostate       Relevant Orders   PSA   Weight loss       Relevant Orders   CBC with Differential       Meds ordered this encounter  Medications  . Cholecalciferol (VITAMIN D3) 25 MCG (1000 UT) CAPS    Sig: Take 1 capsule (1,000 Units total) by mouth daily.    Dispense:  30 capsule   Orders Placed This Encounter  Procedures  . PSA  . CBC with Differential    Patient instructions: We will sign you up for cologuard stool test. Expect a kit in the mail in next few weeks.  Restart vitamin D 1000 units daily over the counter.  You are doing well today Keep an eye on weight, let me know if continued drop noted for further evaluation including blood work.  Return as needed or in 1 year for next physical.   Follow up plan: Return in about 1 year (around 11/16/2020) for annual exam, prior fasting for blood work.  Garlon Hatchet  Danise Mina, MD

## 2019-11-17 NOTE — Assessment & Plan Note (Signed)
Preventative protocols reviewed and updated unless pt declined. Discussed healthy diet and lifestyle.  

## 2019-11-17 NOTE — Patient Instructions (Addendum)
We will sign you up for cologuard stool test. Expect a kit in the mail in next few weeks.  Restart vitamin D 1000 units daily over the counter.  You are doing well today Keep an eye on weight, let me know if continued drop noted for further evaluation including blood work.  Return as needed or in 1 year for next physical.   Health Maintenance, Male Adopting a healthy lifestyle and getting preventive care are important in promoting health and wellness. Ask your health care provider about:  The right schedule for you to have regular tests and exams.  Things you can do on your own to prevent diseases and keep yourself healthy. What should I know about diet, weight, and exercise? Eat a healthy diet   Eat a diet that includes plenty of vegetables, fruits, low-fat dairy products, and lean protein.  Do not eat a lot of foods that are high in solid fats, added sugars, or sodium. Maintain a healthy weight Body mass index (BMI) is a measurement that can be used to identify possible weight problems. It estimates body fat based on height and weight. Your health care provider can help determine your BMI and help you achieve or maintain a healthy weight. Get regular exercise Get regular exercise. This is one of the most important things you can do for your health. Most adults should:  Exercise for at least 150 minutes each week. The exercise should increase your heart rate and make you sweat (moderate-intensity exercise).  Do strengthening exercises at least twice a week. This is in addition to the moderate-intensity exercise.  Spend less time sitting. Even light physical activity can be beneficial. Watch cholesterol and blood lipids Have your blood tested for lipids and cholesterol at 47 years of age, then have this test every 5 years. You may need to have your cholesterol levels checked more often if:  Your lipid or cholesterol levels are high.  You are older than 47 years of age.  You are  at high risk for heart disease. What should I know about cancer screening? Many types of cancers can be detected early and may often be prevented. Depending on your health history and family history, you may need to have cancer screening at various ages. This may include screening for:  Colorectal cancer.  Prostate cancer.  Skin cancer.  Lung cancer. What should I know about heart disease, diabetes, and high blood pressure? Blood pressure and heart disease  High blood pressure causes heart disease and increases the risk of stroke. This is more likely to develop in people who have high blood pressure readings, are of African descent, or are overweight.  Talk with your health care provider about your target blood pressure readings.  Have your blood pressure checked: ? Every 3-5 years if you are 86-64 years of age. ? Every year if you are 60 years old or older.  If you are between the ages of 79 and 31 and are a current or former smoker, ask your health care provider if you should have a one-time screening for abdominal aortic aneurysm (AAA). Diabetes Have regular diabetes screenings. This checks your fasting blood sugar level. Have the screening done:  Once every three years after age 64 if you are at a normal weight and have a low risk for diabetes.  More often and at a younger age if you are overweight or have a high risk for diabetes. What should I know about preventing infection? Hepatitis B If you  have a higher risk for hepatitis B, you should be screened for this virus. Talk with your health care provider to find out if you are at risk for hepatitis B infection. Hepatitis C Blood testing is recommended for:  Everyone born from [birth year] through 1964.  Anyone with known risk factors for hepatitis C. Sexually transmitted infections (STIs)  You should be screened each year for STIs, including gonorrhea and chlamydia, if: ? You are sexually active and are younger than 47 years of  age. ? You are older than 47 years of age and your health care provider tells you that you are at risk for this type of infection. ? Your sexual activity has changed since you were last screened, and you are at increased risk for chlamydia or gonorrhea. Ask your health care provider if you are at risk.  Ask your health care provider about whether you are at high risk for HIV. Your health care provider may recommend a prescription medicine to help prevent HIV infection. If you choose to take medicine to prevent HIV, you should first get tested for HIV. You should then be tested every 3 months for as long as you are taking the medicine. Follow these instructions at home: Lifestyle  Do not use any products that contain nicotine or tobacco, such as cigarettes, e-cigarettes, and chewing tobacco. If you need help quitting, ask your health care provider.  Do not use street drugs.  Do not share needles.  Ask your health care provider for help if you need support or information about quitting drugs. Alcohol use  Do not drink alcohol if your health care provider tells you not to drink.  If you drink alcohol: ? Limit how much you have to 0-2 drinks a day. ? Be aware of how much alcohol is in your drink. In the [place] one drink equals one 12 oz bottle of beer (355 mL), one 5 oz glass of wine (148 mL), or one 1 oz glass of hard liquor (44 mL). General instructions  Schedule regular health, dental, and eye exams.  Stay current with your vaccines.  Tell your health care provider if: ? You often feel depressed. ? You have ever been abused or do not feel safe at home. Summary  Adopting a healthy lifestyle and getting preventive care are important in promoting health and wellness.  Follow your health care provider's instructions about healthy diet, exercising, and getting tested or screened for diseases.  Follow your health care provider's instructions on monitoring your cholesterol and blood  pressure. This information is not intended to replace advice given to you by your health care provider. Make sure you discuss any questions you have with your health care provider. Document Released: 05/03/2008 Document Revised: 10/29/2018 Document Reviewed: 10/29/2018 Elsevier Patient Education  2020 [employer].

## 2019-11-17 NOTE — Assessment & Plan Note (Signed)
Dentist was concerned for possible OSA. ESS today = 3.  Will monitor for now.

## 2019-11-17 NOTE — Assessment & Plan Note (Signed)
Chronic, stable. Continue to monitor with tight BP control. No microalbuminuria.

## 2019-11-17 NOTE — Assessment & Plan Note (Addendum)
Has not been taking vit D replacement. rec restart 1000 IU daily.

## 2019-11-17 NOTE — Assessment & Plan Note (Addendum)
Chronic, stable. Continue current regimen. Has not taken BP meds yet this morning.

## 2019-11-17 NOTE — Assessment & Plan Note (Signed)
Chronic, stable continue tricor. The ASCVD Risk score Mikey Bussing DC Jr., et al., 2013) failed to calculate for the following reasons:   The valid total cholesterol range is 130 to 320 mg/dL

## 2019-11-27 ENCOUNTER — Other Ambulatory Visit: Payer: Self-pay | Admitting: Family Medicine

## 2019-12-16 ENCOUNTER — Encounter: Payer: Self-pay | Admitting: Family Medicine

## 2019-12-16 ENCOUNTER — Telehealth: Payer: Self-pay

## 2019-12-16 LAB — COLOGUARD: Cologuard: NEGATIVE

## 2019-12-16 NOTE — Telephone Encounter (Signed)
Spoke with pt notifying him the Cologuard was negative and will repeat in 3 yrs.  Pt verbalizes understanding.

## 2020-02-02 ENCOUNTER — Other Ambulatory Visit: Payer: Self-pay | Admitting: Family Medicine

## 2020-04-29 ENCOUNTER — Telehealth: Payer: Self-pay

## 2020-04-29 NOTE — Telephone Encounter (Signed)
Per pts appt notes pt has appt on 05/02/20 at 8 AM.

## 2020-04-29 NOTE — Telephone Encounter (Signed)
[facility] - Client TELEPHONE ADVICE RECORD [facility] Patient Name: Shawn Vaughn Gender: Male DOB: [birth year] Age: 49 Y 84 M 24 D Return Phone Number: [phone] (Primary), [phone] (Secondary) Address: City/State/Zip: [place] [address] Client [facility] - Client Client Site [facility] Physician Ria Bush - MD Contact Type Call Who Is Calling Patient / Member / Family / Caregiver Call Type Triage / Clinical Caller Name Joaquim Lai Relationship To Patient Mother Return Phone Number [phone] (Secondary) Chief Complaint Leg Swelling And Edema Reason for Call Symptomatic / Request for [place]ita states her son has been experiencing left leg swelling and discoloration. Translation No Nurse Assessment Nurse: Jimmye Norman, RN, Whitney Date/Time (Eastern Time): 04/29/2020 10:41:13 AM Confirm and document reason for call. If symptomatic, describe symptoms. ---Caller states her son had been experiencing left leg swelling and discoloration. Symptoms started yesterday, and this morning seems to be completely normal, denies symptoms right now. Has the patient had close contact with a person known or suspected to have the novel coronavirus illness OR traveled / lives in area with major community spread (including international travel) in the last 14 days from the onset of symptoms? * If Asymptomatic, screen for exposure and travel within the last 14 days. ---No Does the patient have any new or worsening symptoms? ---No Please document clinical information provided and list any resource used. ---Caller is not currently with her son but states he has an appointment on Monday and had no symptoms this morning when he woke up and went to work. RN advised to have him call if symptoms arise again. Caller verbalized understanding. Guidelines Guideline Title  Affirmed Question Affirmed Notes Nurse Date/Time (Eastern Time) Disp. Time Eilene Ghazi Time) Disposition Final User 04/29/2020 10:46:15 AM Clinical Call Yes Jimmye Norman, RN, Loree Fee

## 2020-04-29 NOTE — Telephone Encounter (Signed)
Noted  

## 2020-05-02 ENCOUNTER — Other Ambulatory Visit: Payer: Self-pay

## 2020-05-02 ENCOUNTER — Encounter: Payer: Self-pay | Admitting: Family Medicine

## 2020-05-02 ENCOUNTER — Ambulatory Visit: Payer: BC Managed Care – PPO | Admitting: Family Medicine

## 2020-05-02 VITALS — BP 140/88 | HR 77 | Temp 97.5°F | Ht 67.75 in | Wt 184.6 lb

## 2020-05-02 DIAGNOSIS — E559 Vitamin D deficiency, unspecified: Secondary | ICD-10-CM | POA: Diagnosis not present

## 2020-05-02 DIAGNOSIS — R600 Localized edema: Secondary | ICD-10-CM

## 2020-05-02 DIAGNOSIS — I10 Essential (primary) hypertension: Secondary | ICD-10-CM

## 2020-05-02 NOTE — Patient Instructions (Addendum)
I think you have some dependent edema or swelling from being on your feet. This gets better overnight and can happen after a long day at work.  Increase water intake, continue limiting salt to help control this.   Edema  Edema is an abnormal buildup of fluids in the body tissues and under the skin. Swelling of the legs, feet, and ankles is a common symptom that becomes more likely as you get older. Swelling is also common in looser tissues, like around the eyes. When the affected area is squeezed, the fluid may move out of that spot and leave a dent for a few moments. This dent is called pitting edema. There are many possible causes of edema. Eating too much salt (sodium) and being on your feet or sitting for a long time can cause edema in your legs, feet, and ankles. Hot weather may make edema worse. Common causes of edema include:  Heart failure.  Liver or kidney disease.  Weak leg blood vessels.  Cancer.  An injury.  Pregnancy.  Medicines.  Being obese.  Low protein levels in the blood. Edema is usually painless. Your skin may look swollen or shiny. Follow these instructions at home:  Keep the affected body part raised (elevated) above the level of your heart when you are sitting or lying down.  Do not sit still or stand for long periods of time.  Do not wear tight clothing. Do not wear garters on your upper legs.  Exercise your legs to get your circulation going. This helps to move the fluid back into your blood vessels, and it may help the swelling go down.  Wear elastic bandages or support stockings to reduce swelling as told by your health care provider.  Eat a low-salt (low-sodium) diet to reduce fluid as told by your health care provider.  Depending on the cause of your swelling, you may need to limit how much fluid you drink (fluid restriction).  Take over-the-counter and prescription medicines only as told by your health care provider. Contact a health care  provider if:  Your edema does not get better with treatment.  You have heart, liver, or kidney disease and have symptoms of edema.  You have sudden and unexplained weight gain. Get help right away if:  You develop shortness of breath or chest pain.  You cannot breathe when you lie down.  You develop pain, redness, or warmth in the swollen areas.  You have heart, liver, or kidney disease and suddenly get edema.  You have a fever and your symptoms suddenly get worse. Summary  Edema is an abnormal buildup of fluids in the body tissues and under the skin.  Eating too much salt (sodium) and being on your feet or sitting for a long time can cause edema in your legs, feet, and ankles.  Keep the affected body part raised (elevated) above the level of your heart when you are sitting or lying down. This information is not intended to replace advice given to you by your health care provider. Make sure you discuss any questions you have with your health care provider. Document Revised: 03/25/2019 Document Reviewed: 12/08/2016 Elsevier Patient Education  [employer].

## 2020-05-02 NOTE — Assessment & Plan Note (Signed)
Compliant with 1000 IU daily.

## 2020-05-02 NOTE — Assessment & Plan Note (Signed)
Describes dependent edema, now largely resolved.  Doubt amlodipine related.  Supportive care reviewed - elevating legs, increased water, limiting sodium.  Update if worsening. Pt agrees with plan.

## 2020-05-02 NOTE — Progress Notes (Signed)
This visit was conducted in person.  BP 140/88   Pulse 77   Temp (!) 97.5 F (36.4 C) (Temporal)   Ht 5' 7.75" (1.721 m)   Wt 184 lb 9.6 oz (83.7 kg)   SpO2 99%   BMI 28.28 kg/m    CC: L leg swelling Subjective:    Patient ID: Shawn Vaughn, male    DOB: 1971-07-01, 50 y.o.   MRN: [id number]  HPI: Shawn Vaughn is a 49 y.o. male presenting on 05/02/2020 for Leg Swelling (Swelling in the lower left leg. onset of last Thursday night. Swelling will come at night but will be gone in the morning. No known injury or pain. )   Thursday evening after PM shower noted some puffiness L>R leg around ankles. Woke up the next morning with resolution of swelling. Puffiness did return the next day but has been normal since then. No pain or redness. No changed salt intake recently.   Compliant with metoprolol and amlodipine. Home BP readings running well controlled, a bit high today. 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  Considering COVID vaccine over next few weeks.      Relevant past medical, surgical, family and social history reviewed and updated as indicated. Interim medical history since our last visit reviewed. Allergies and medications reviewed and updated. Outpatient Medications Prior to Visit  Medication Sig Dispense Refill  . amLODipine (NORVASC) 10 MG tablet TAKE 1 TABLET BY MOUTH EVERY DAY 90 tablet 3  . Cholecalciferol (VITAMIN D3) 25 MCG (1000 UT) CAPS Take 1 capsule (1,000 Units total) by mouth daily. 30 capsule   . fenofibrate (TRICOR) 145 MG tablet TAKE 1 TABLET BY MOUTH EVERY DAY 90 tablet 3  . metoprolol succinate (TOPROL-XL) 100 MG 24 hr tablet TAKE 1 TABLET (100 MG TOTAL) BY MOUTH DAILY. TAKE WITH OR IMMEDIATELY FOLLOWING A MEAL. 90 tablet 3   No facility-administered medications prior to visit.     Per HPI unless specifically indicated in ROS section below Review of Systems Objective:  BP 140/88   Pulse 77   Temp (!) 97.5 F (36.4 C) (Temporal)   Ht 5'  7.75" (1.721 m)   Wt 184 lb 9.6 oz (83.7 kg)   SpO2 99%   BMI 28.28 kg/m   Wt Readings from Last 3 Encounters:  05/02/20 184 lb 9.6 oz (83.7 kg)  11/17/19 183 lb 5 oz (83.2 kg)  11/13/18 190 lb 8 oz (86.4 kg)      Physical Exam Vitals and nursing note reviewed.  Constitutional:      Appearance: Normal appearance. He is not ill-appearing.  Cardiovascular:     Rate and Rhythm: Normal rate and regular rhythm.     Pulses: Normal pulses.     Heart sounds: Normal heart sounds. No murmur heard.   Pulmonary:     Effort: No respiratory distress.     Breath sounds: Normal breath sounds. No wheezing, rhonchi or rales.  Musculoskeletal:     Right lower leg: No edema.     Left lower leg: No edema.  Neurological:     Mental Status: He is alert.  Psychiatric:        Mood and Affect: Mood normal.        Behavior: Behavior normal.       Lab Results  Component Value Date   TSH 3.74 11/06/2018    Lab Results  Component Value Date   CREATININE 1.57 (H) 11/10/2019   BUN 20 11/10/2019   NA  140 11/10/2019   K 4.0 11/10/2019   CL 102 11/10/2019   CO2 29 11/10/2019    Assessment & Plan:  This visit occurred during the SARS-CoV-2 public health emergency.  Safety protocols were in place, including screening questions prior to the visit, additional usage of staff PPE, and extensive cleaning of exam room while observing appropriate contact time as indicated for disinfecting solutions.   Problem List Items Addressed This Visit    Vitamin D deficiency    Compliant with 1000 IU daily.       Pedal edema - Primary    Describes dependent edema, now largely resolved.  Doubt amlodipine related.  Supportive care reviewed - elevating legs, increased water, limiting sodium.  Update if worsening. Pt agrees with plan.       HTN (hypertension)    BP well controlled at home.           No orders of the defined types were placed in this encounter.  No orders of the defined types were placed  in this encounter.   Patient instructions: I think you have some dependent edema or swelling from being on your feet. This gets better overnight and can happen after a long day at work.  Increase water intake, continue limiting salt to help control this.   Follow up plan: Return in about 6 months (around 11/01/2020) for annual exam, prior fasting for blood work.  Ria Bush, MD

## 2020-05-02 NOTE — Assessment & Plan Note (Signed)
BP well controlled at home.

## 2020-08-24 ENCOUNTER — Other Ambulatory Visit: Payer: Self-pay

## 2020-08-24 ENCOUNTER — Encounter: Payer: Self-pay | Admitting: Internal Medicine

## 2020-08-24 ENCOUNTER — Ambulatory Visit: Payer: BC Managed Care – PPO | Admitting: Internal Medicine

## 2020-08-24 DIAGNOSIS — H1132 Conjunctival hemorrhage, left eye: Secondary | ICD-10-CM | POA: Insufficient documentation

## 2020-08-24 NOTE — Assessment & Plan Note (Signed)
Reassured that this is generally benign and self limited Will wait for resolution If not gone in 1-2 weeks, should see eye doctor.

## 2020-08-24 NOTE — Progress Notes (Signed)
Subjective:    Patient ID: Shawn Vaughn, male    DOB: April 25, 1971, 49 y.o.   MRN: [id number]  HPI Here due to blood in his left eye This visit occurred during the SARS-CoV-2 public health emergency.  Safety protocols were in place, including screening questions prior to the visit, additional usage of staff PPE, and extensive cleaning of exam room while observing appropriate contact time as indicated for disinfecting solutions.   Noted a "little knot" on inside part of left eye for about 2 weeks Noticed this a little after he saw it being red No injury to the eye No heavy lifting, straining, etc--but may have gotten some weedkiller in there  Vision is fine Did have eye exam a few months ago  Current Outpatient Medications on File Prior to Visit  Medication Sig Dispense Refill  . amLODipine (NORVASC) 10 MG tablet TAKE 1 TABLET BY MOUTH EVERY DAY 90 tablet 3  . Cholecalciferol (VITAMIN D3) 25 MCG (1000 UT) CAPS Take 1 capsule (1,000 Units total) by mouth daily. 30 capsule   . fenofibrate (TRICOR) 145 MG tablet TAKE 1 TABLET BY MOUTH EVERY DAY 90 tablet 3  . metoprolol succinate (TOPROL-XL) 100 MG 24 hr tablet TAKE 1 TABLET (100 MG TOTAL) BY MOUTH DAILY. TAKE WITH OR IMMEDIATELY FOLLOWING A MEAL. 90 tablet 3   No current facility-administered medications on file prior to visit.    Allergies  Allergen Reactions  . Ibuprofen     Blood in stool    Past Medical History:  Diagnosis Date  . History of chicken pox   . History of pneumonia 2002   staph  . Hypertension   . Hypertriglyceridemia   . Lyme disease 05/2015  . Nephrolithiasis 07/2015   with R hydronephrosis that resolved  . Solitary pulmonary nodule on lung CT 09/21/2015   61m RLL, no f/u needed     Past Surgical History:  Procedure Laterality Date  . UKoreaECHOCARDIOGRAPHY  2002   WNL per paper chart, EF >55%    Family History  Problem Relation Age of Onset  . Hypertension Mother   . Hyperlipidemia Neg Hx   .  Diabetes Neg Hx   . Coronary artery disease Neg Hx   . Stroke Neg Hx   . Cancer Maternal Grandfather [phone]      prostate  . Prostate cancer Maternal Uncle   . Nephrolithiasis Paternal Uncle     Social History   Socioeconomic History  . Marital status: Single    Spouse name: Not on file  . Number of children: Not on file  . Years of education: Not on file  . Highest education level: Not on file  Occupational History  . Not on file  Tobacco Use  . Smoking status: Never Smoker  . Smokeless tobacco: Never Used  Substance and Sexual Activity  . Alcohol use: No  . Drug use: No  . Sexual activity: Not on file  Other Topics Concern  . Not on file  Social History Narrative   Caffeine: occasional sodas   Lives with mom, and dad but has his own place, 4 dogs   Occupation: works at [employer]   Activity: hunts 3x/wk   Diet: good water, fruits/vegetables daily   Social Determinants of HRadio broadcast assistantStrain:   . Difficulty of Paying Living Expenses: Not on file  Food Insecurity:   . Worried About RCharity fundraiserin the Last Year: Not on  file  . [place] in the Last Year: Not on file  Transportation Needs:   . Lack of Transportation (Medical): Not on file  . Lack of Transportation (Non-Medical): Not on file  Physical Activity:   . Days of Exercise per Week: Not on file  . Minutes of Exercise per Session: Not on file  Stress:   . Feeling of Stress : Not on file  Social Connections:   . Frequency of Communication with Friends and Family: Not on file  . Frequency of Social Gatherings with Friends and Family: Not on file  . Attends Religious Services: Not on file  . Active Member of Clubs or Organizations: Not on file  . Attends Archivist Meetings: Not on file  . Marital Status: Not on file  Intimate Partner Violence:   . Fear of Current or Ex-Partner: Not on file  . Emotionally Abused: Not on file  . Physically Abused: Not on file  .  Sexually Abused: Not on file   Review of Systems  No illness No fever No eye allergies (or other allergies)     Objective:   Physical Exam Eyes:     Comments: Redness in inferomedial portion of right conjunctiva No clear mass Pupil looks normal and uninvolved            Assessment & Plan:

## 2020-08-24 NOTE — Patient Instructions (Signed)
This should get better on its own, but if the redness doesn't go away within 1-2 weeks--you should get checked again by your eye doctor.

## 2020-11-15 ENCOUNTER — Other Ambulatory Visit: Payer: Self-pay | Admitting: Family Medicine

## 2020-12-01 ENCOUNTER — Other Ambulatory Visit: Payer: Self-pay | Admitting: Family Medicine

## 2020-12-01 DIAGNOSIS — I10 Essential (primary) hypertension: Secondary | ICD-10-CM

## 2020-12-01 DIAGNOSIS — E781 Pure hyperglyceridemia: Secondary | ICD-10-CM

## 2020-12-01 DIAGNOSIS — E559 Vitamin D deficiency, unspecified: Secondary | ICD-10-CM

## 2020-12-01 DIAGNOSIS — Z8042 Family history of malignant neoplasm of prostate: Secondary | ICD-10-CM

## 2020-12-02 ENCOUNTER — Other Ambulatory Visit (INDEPENDENT_AMBULATORY_CARE_PROVIDER_SITE_OTHER): Payer: BC Managed Care – PPO

## 2020-12-02 ENCOUNTER — Other Ambulatory Visit: Payer: Self-pay

## 2020-12-02 DIAGNOSIS — Z8042 Family history of malignant neoplasm of prostate: Secondary | ICD-10-CM | POA: Diagnosis not present

## 2020-12-02 DIAGNOSIS — E559 Vitamin D deficiency, unspecified: Secondary | ICD-10-CM | POA: Diagnosis not present

## 2020-12-02 DIAGNOSIS — I10 Essential (primary) hypertension: Secondary | ICD-10-CM | POA: Diagnosis not present

## 2020-12-02 DIAGNOSIS — E781 Pure hyperglyceridemia: Secondary | ICD-10-CM | POA: Diagnosis not present

## 2020-12-02 LAB — VITAMIN D 25 HYDROXY (VIT D DEFICIENCY, FRACTURES): VITD: 43.5 ng/mL (ref 30.00–100.00)

## 2020-12-02 LAB — COMPREHENSIVE METABOLIC PANEL
ALT: 25 U/L (ref 0–53)
AST: 24 U/L (ref 0–37)
Albumin: 4.7 g/dL (ref 3.5–5.2)
Alkaline Phosphatase: 43 U/L (ref 39–117)
BUN: 22 mg/dL (ref 6–23)
CO2: 30 mEq/L (ref 19–32)
Calcium: 10 mg/dL (ref 8.4–10.5)
Chloride: 100 mEq/L (ref 96–112)
Creatinine, Ser: 1.5 mg/dL (ref 0.40–1.50)
GFR: 54.88 mL/min — ABNORMAL LOW (ref >60.00)
Glucose, Bld: 85 mg/dL (ref 70–99)
Potassium: 3.7 mEq/L (ref 3.5–5.1)
Sodium: 139 mEq/L (ref 135–145)
Total Bilirubin: 0.7 mg/dL (ref 0.2–1.2)
Total Protein: 8.1 g/dL (ref 6.0–8.3)

## 2020-12-02 LAB — LIPID PANEL
Cholesterol: 124 mg/dL (ref 0–200)
HDL: 37.9 mg/dL — ABNORMAL LOW (ref >39.00)
LDL Cholesterol: 64 mg/dL (ref 0–99)
NonHDL: 86.01
Total CHOL/HDL Ratio: 3
Triglycerides: 112 mg/dL (ref 0.0–149.0)
VLDL: 22.4 mg/dL (ref 0.0–40.0)

## 2020-12-02 LAB — MICROALBUMIN / CREATININE URINE RATIO
Creatinine,U: 126 mg/dL
Microalb Creat Ratio: 0.6 mg/g (ref 0.0–30.0)
Microalb, Ur: <0.7 mg/dL (ref 0.0–1.9)

## 2020-12-02 LAB — PSA: PSA: 0.42 ng/mL (ref 0.10–4.00)

## 2020-12-09 ENCOUNTER — Encounter: Payer: BC Managed Care – PPO | Admitting: Family Medicine

## 2020-12-13 ENCOUNTER — Encounter: Payer: Self-pay | Admitting: Family Medicine

## 2020-12-13 ENCOUNTER — Ambulatory Visit (INDEPENDENT_AMBULATORY_CARE_PROVIDER_SITE_OTHER): Payer: BC Managed Care – PPO | Admitting: Family Medicine

## 2020-12-13 ENCOUNTER — Other Ambulatory Visit: Payer: Self-pay

## 2020-12-13 VITALS — BP 158/100 | HR 97 | Temp 97.6°F | Ht 67.5 in | Wt 188.1 lb

## 2020-12-13 DIAGNOSIS — E559 Vitamin D deficiency, unspecified: Secondary | ICD-10-CM

## 2020-12-13 DIAGNOSIS — Z0000 Encounter for general adult medical examination without abnormal findings: Secondary | ICD-10-CM | POA: Diagnosis not present

## 2020-12-13 DIAGNOSIS — E781 Pure hyperglyceridemia: Secondary | ICD-10-CM | POA: Diagnosis not present

## 2020-12-13 DIAGNOSIS — I10 Essential (primary) hypertension: Secondary | ICD-10-CM

## 2020-12-13 DIAGNOSIS — N289 Disorder of kidney and ureter, unspecified: Secondary | ICD-10-CM | POA: Diagnosis not present

## 2020-12-13 MED ORDER — METOPROLOL SUCCINATE ER 100 MG PO TB24: 100.0000 mg | ORAL_TABLET | Freq: Every day | ORAL | 3 refills | Status: DC

## 2020-12-13 MED ORDER — FENOFIBRATE 145 MG PO TABS: 145.0000 mg | ORAL_TABLET | Freq: Every day | ORAL | 3 refills | Status: DC

## 2020-12-13 MED ORDER — AMLODIPINE BESYLATE 10 MG PO TABS: 10.0000 mg | ORAL_TABLET | Freq: Every day | ORAL | 3 refills | Status: DC

## 2020-12-13 MED ORDER — ASCORBIC ACID 500 MG PO TABS: 500.0000 mg | ORAL_TABLET | Freq: Every day | ORAL | Status: DC

## 2020-12-13 NOTE — Assessment & Plan Note (Signed)
Doing well on 1000 IU daily - continue.

## 2020-12-13 NOTE — Assessment & Plan Note (Signed)
Preventative protocols reviewed and updated unless pt declined. Discussed healthy diet and lifestyle.  

## 2020-12-13 NOTE — Assessment & Plan Note (Addendum)
Deteriorated control noted today despite taking meds regularly. He attributes to bacon he had this morning. Home readings largely well controlled. Advised to start monitoring BP closely at home, let me know if consistently >140/90, work on increased potassium rich foods, will return in 3 months for HTN f/u.

## 2020-12-13 NOTE — Progress Notes (Signed)
Patient ID: Shawn Vaughn, male    DOB: 09/25/1971, 50 y.o.   MRN: [id number]  This visit was conducted in person.  BP (!) 158/100 (BP Location: Right Arm, Patient Position: Sitting, Cuff Size: Large)   Pulse 97   Temp 97.6 F (36.4 C) (Temporal)   Ht 5' 7.5" (1.715 m)   Wt 188 lb 2 oz (85.3 kg)   SpO2 98%   BMI 29.03 kg/m   Elevated on retesting  BP Readings from Last 3 Encounters:  12/13/20 (!) 158/100  08/24/20 122/86  05/02/20 140/88    CC: CPE Subjective:   HPI: Shawn Vaughn is a 50 y.o. male presenting on 12/13/2020 for Annual Exam   BP well controlled at home. Woke up later than normal, took meds later than normal. Ate bacon this morning.   Preventative: Colon cancer screening - Cologuard negative 11/2019 fmhx prostate cancer 70s. Check PSA/DRE today.  Flu shot yearly, declines this year [place] 06/2020, 07/2020 [id number]  Seat belt use discussed. Sunscreen use discussed.No changing moles on skin.  Non smoker Alcohol - none Dentist q6 mo  Eye exam - yearly  Caffeine: occasional sodas  Lives with mom, and dad but has his own place, 4 dogs  Occupation: works at [employer]  Activity: hunts with dogs 3x/wk, tries to walk 1 mi/day Diet: good water, fruits/vegetables daily     Relevant past medical, surgical, family and social history reviewed and updated as indicated. Interim medical history since our last visit reviewed. Allergies and medications reviewed and updated. Outpatient Medications Prior to Visit  Medication Sig Dispense Refill  . Cholecalciferol (VITAMIN D3) 25 MCG (1000 UT) CAPS Take 1 capsule (1,000 Units total) by mouth daily. 30 capsule   . amLODipine (NORVASC) 10 MG tablet TAKE 1 TABLET BY MOUTH EVERY DAY 90 tablet 3  . fenofibrate (TRICOR) 145 MG tablet TAKE 1 TABLET BY MOUTH EVERY DAY 90 tablet 3  . metoprolol succinate (TOPROL-XL) 100 MG 24 hr tablet TAKE 1 TABLET (100 MG TOTAL) BY MOUTH DAILY. TAKE WITH OR  IMMEDIATELY FOLLOWING A MEAL. 90 tablet 0   No facility-administered medications prior to visit.     Per HPI unless specifically indicated in ROS section below Review of Systems  Constitutional: Negative for activity change, appetite change, chills, fatigue, fever and unexpected weight change.  HENT: Negative for hearing loss.   Eyes: Negative for visual disturbance.  Respiratory: Negative for cough, chest tightness, shortness of breath and wheezing.   Cardiovascular: Negative for chest pain, palpitations and leg swelling.  Gastrointestinal: Negative for abdominal distention, abdominal pain, blood in stool, constipation, diarrhea, nausea and vomiting.  Genitourinary: Negative for difficulty urinating and hematuria.  Musculoskeletal: Negative for arthralgias, myalgias and neck pain.  Skin: Negative for rash.  Neurological: Negative for dizziness, seizures, syncope and headaches.  Hematological: Negative for adenopathy. Does not bruise/bleed easily.  Psychiatric/Behavioral: Negative for dysphoric mood. The patient is not nervous/anxious.    Objective:  BP (!) 158/100 (BP Location: Right Arm, Patient Position: Sitting, Cuff Size: Large)   Pulse 97   Temp 97.6 F (36.4 C) (Temporal)   Ht 5' 7.5" (1.715 m)   Wt 188 lb 2 oz (85.3 kg)   SpO2 98%   BMI 29.03 kg/m   Wt Readings from Last 3 Encounters:  12/13/20 188 lb 2 oz (85.3 kg)  08/24/20 189 lb (85.7 kg)  05/02/20 184 lb 9.6 oz (83.7 kg)      Physical Exam Vitals  and nursing note reviewed.  Constitutional:      General: He is not in acute distress.    Appearance: Normal appearance. He is well-developed and well-nourished. He is not ill-appearing.  HENT:     Head: Normocephalic and atraumatic.     Right Ear: Hearing, tympanic membrane, ear canal and external ear normal.     Left Ear: Hearing, tympanic membrane, ear canal and external ear normal.     Mouth/Throat:     Mouth: Oropharynx is clear and moist and mucous membranes  are normal.     Pharynx: No posterior oropharyngeal edema.  Eyes:     General: No scleral icterus.    Extraocular Movements: Extraocular movements intact and EOM normal.     Conjunctiva/sclera: Conjunctivae normal.     Pupils: Pupils are equal, round, and reactive to light.  Neck:     Thyroid: No thyroid mass or thyromegaly.  Cardiovascular:     Rate and Rhythm: Normal rate and regular rhythm.     Pulses: Normal pulses and intact distal pulses.          Radial pulses are 2+ on the right side and 2+ on the left side.     Heart sounds: Normal heart sounds. No murmur heard.   Pulmonary:     Effort: Pulmonary effort is normal. No respiratory distress.     Breath sounds: Normal breath sounds. No wheezing, rhonchi or rales.  Abdominal:     General: Abdomen is flat. Bowel sounds are normal. There is no distension.     Palpations: Abdomen is soft. There is no mass.     Tenderness: There is no abdominal tenderness. There is no guarding or rebound.     Hernia: No hernia is present.  Musculoskeletal:        General: No edema. Normal range of motion.     Cervical back: Normal range of motion and neck supple.     Right lower leg: No edema.     Left lower leg: No edema.  Lymphadenopathy:     Cervical: No cervical adenopathy.  Skin:    General: Skin is warm and dry.     Findings: No rash.  Neurological:     General: No focal deficit present.     Mental Status: He is alert and oriented to person, place, and time.     Comments: CN grossly intact, station and gait intact  Psychiatric:        Mood and Affect: Mood and affect and mood normal.        Behavior: Behavior normal.        Thought Content: Thought content normal.        Judgment: Judgment normal.       Results for orders placed or performed in visit on 12/02/20  PSA  Result Value Ref Range   PSA 0.42 0.10 - 4.00 ng/mL  Microalbumin / creatinine urine ratio  Result Value Ref Range   Microalb, Ur <0.7 0.0 - 1.9 mg/dL    Creatinine,U 126.0 mg/dL   Microalb Creat Ratio 0.6 0.0 - 30.0 mg/g  VITAMIN D 25 Hydroxy (Vit-D Deficiency, Fractures)  Result Value Ref Range   VITD 43.50 30.00 - 100.00 ng/mL  Comprehensive metabolic panel  Result Value Ref Range   Sodium 139 135 - 145 mEq/L   Potassium 3.7 3.5 - 5.1 mEq/L   Chloride 100 96 - 112 mEq/L   CO2 30 19 - 32 mEq/L   Glucose, Bld 85 70 -  99 mg/dL   BUN 22 6 - 23 mg/dL   Creatinine, Ser 1.50 0.40 - 1.50 mg/dL   Total Bilirubin 0.7 0.2 - 1.2 mg/dL   Alkaline Phosphatase 43 39 - 117 U/L   AST 24 0 - 37 U/L   ALT 25 0 - 53 U/L   Total Protein 8.1 6.0 - 8.3 g/dL   Albumin 4.7 3.5 - 5.2 g/dL   GFR 54.88 (L) >60.00 mL/min   Calcium 10.0 8.4 - 10.5 mg/dL  Lipid panel  Result Value Ref Range   Cholesterol 124 0 - 200 mg/dL   Triglycerides 112.0 0.0 - 149.0 mg/dL   HDL 37.90 (L) >39.00 mg/dL   VLDL 22.4 0.0 - 40.0 mg/dL   LDL Cholesterol 64 0 - 99 mg/dL   Total CHOL/HDL Ratio 3    NonHDL 86.01    Assessment & Plan:  This visit occurred during the SARS-CoV-2 public health emergency.  Safety protocols were in place, including screening questions prior to the visit, additional usage of staff PPE, and extensive cleaning of exam room while observing appropriate contact time as indicated for disinfecting solutions.   Problem List Items Addressed This Visit    Vitamin D deficiency    Doing well on 1000 IU daily - continue.       Renal insufficiency    Umicroalb normal.  Mild deterioration noted - possibly due to increasing BP.  Consider reassessment at f/u visit.       Hypertriglyceridemia    Chronic, stable on current regimen. Trig 600s prior to starting fibrate (2013) The ASCVD Risk score Mikey Bussing DC Jr., et al., 2013) failed to calculate for the following reasons:   The valid total cholesterol range is 130 to 320 mg/dL       Relevant Medications   amLODipine (NORVASC) 10 MG tablet   fenofibrate (TRICOR) 145 MG tablet   metoprolol succinate  (TOPROL-XL) 100 MG 24 hr tablet   HTN (hypertension)    Deteriorated control noted today despite taking meds regularly. He attributes to bacon he had this morning. Home readings largely well controlled. Advised to start monitoring BP closely at home, let me know if consistently >140/90, work on increased potassium rich foods, will return in 3 months for HTN f/u.       Relevant Medications   amLODipine (NORVASC) 10 MG tablet   fenofibrate (TRICOR) 145 MG tablet   metoprolol succinate (TOPROL-XL) 100 MG 24 hr tablet   Healthcare maintenance - Primary    Preventative protocols reviewed and updated unless pt declined. Discussed healthy diet and lifestyle.           Meds ordered this encounter  Medications  . amLODipine (NORVASC) 10 MG tablet    Sig: Take 1 tablet (10 mg total) by mouth daily.    Dispense:  90 tablet    Refill:  3  . fenofibrate (TRICOR) 145 MG tablet    Sig: Take 1 tablet (145 mg total) by mouth daily.    Dispense:  90 tablet    Refill:  3  . metoprolol succinate (TOPROL-XL) 100 MG 24 hr tablet    Sig: Take 1 tablet (100 mg total) by mouth daily. Take with or immediately following a meal.    Dispense:  90 tablet    Refill:  3  . ascorbic acid (VITAMIN C) 500 MG tablet    Sig: Take 1 tablet (500 mg total) by mouth daily.   No orders of the defined types were placed in  this encounter.   Patient instructions: Start monitoring blood pressures more closely at home, let me know if consistently >140/90 to discuss change in blood pressure medicine.  Return in 3 months for blood pressure check.   Follow up plan: Return in about 3 months (around 03/13/2021) for follow up visit.  Ria Bush, MD

## 2020-12-13 NOTE — Assessment & Plan Note (Signed)
Umicroalb normal.  Mild deterioration noted - possibly due to increasing BP.  Consider reassessment at f/u visit.

## 2020-12-13 NOTE — Assessment & Plan Note (Signed)
Chronic, stable on current regimen. Trig 600s prior to starting fibrate (2013) The ASCVD Risk score Mikey Bussing DC Jr., et al., 2013) failed to calculate for the following reasons:   The valid total cholesterol range is 130 to 320 mg/dL

## 2020-12-13 NOTE — Patient Instructions (Addendum)
Start monitoring blood pressures more closely at home, let me know if consistently >140/90 to discuss change in blood pressure medicine.  Return in 3 months for blood pressure check.   Health Maintenance, Male Adopting a healthy lifestyle and getting preventive care are important in promoting health and wellness. Ask your health care provider about:  The right schedule for you to have regular tests and exams.  Things you can do on your own to prevent diseases and keep yourself healthy. What should I know about diet, weight, and exercise? Eat a healthy diet  Eat a diet that includes plenty of vegetables, fruits, low-fat dairy products, and lean protein.  Do not eat a lot of foods that are high in solid fats, added sugars, or sodium.   Maintain a healthy weight Body mass index (BMI) is a measurement that can be used to identify possible weight problems. It estimates body fat based on height and weight. Your health care provider can help determine your BMI and help you achieve or maintain a healthy weight. Get regular exercise Get regular exercise. This is one of the most important things you can do for your health. Most adults should:  Exercise for at least 150 minutes each week. The exercise should increase your heart rate and make you sweat (moderate-intensity exercise).  Do strengthening exercises at least twice a week. This is in addition to the moderate-intensity exercise.  Spend less time sitting. Even light physical activity can be beneficial. Watch cholesterol and blood lipids Have your blood tested for lipids and cholesterol at 49 years of age, then have this test every 5 years. You may need to have your cholesterol levels checked more often if:  Your lipid or cholesterol levels are high.  You are older than 49 years of age.  You are at high risk for heart disease. What should I know about cancer screening? Many types of cancers can be detected early and may often be  prevented. Depending on your health history and family history, you may need to have cancer screening at various ages. This may include screening for:  Colorectal cancer.  Prostate cancer.  Skin cancer.  Lung cancer. What should I know about heart disease, diabetes, and high blood pressure? Blood pressure and heart disease  High blood pressure causes heart disease and increases the risk of stroke. This is more likely to develop in people who have high blood pressure readings, are of African descent, or are overweight.  Talk with your health care provider about your target blood pressure readings.  Have your blood pressure checked: ? Every 3-5 years if you are 29-75 years of age. ? Every year if you are 63 years old or older.  If you are between the ages of 70 and 12 and are a current or former smoker, ask your health care provider if you should have a one-time screening for abdominal aortic aneurysm (AAA). Diabetes Have regular diabetes screenings. This checks your fasting blood sugar level. Have the screening done:  Once every three years after age 61 if you are at a normal weight and have a low risk for diabetes.  More often and at a younger age if you are overweight or have a high risk for diabetes. What should I know about preventing infection? Hepatitis B If you have a higher risk for hepatitis B, you should be screened for this virus. Talk with your health care provider to find out if you are at risk for hepatitis B infection. Hepatitis  C Blood testing is recommended for:  Everyone born from [birth year] through 1964.  Anyone with known risk factors for hepatitis C. Sexually transmitted infections (STIs)  You should be screened each year for STIs, including gonorrhea and chlamydia, if: ? You are sexually active and are younger than 49 years of age. ? You are older than 49 years of age and your health care provider tells you that you are at risk for this type of  infection. ? Your sexual activity has changed since you were last screened, and you are at increased risk for chlamydia or gonorrhea. Ask your health care provider if you are at risk.  Ask your health care provider about whether you are at high risk for HIV. Your health care provider may recommend a prescription medicine to help prevent HIV infection. If you choose to take medicine to prevent HIV, you should first get tested for HIV. You should then be tested every 3 months for as long as you are taking the medicine. Follow these instructions at home: Lifestyle  Do not use any products that contain nicotine or tobacco, such as cigarettes, e-cigarettes, and chewing tobacco. If you need help quitting, ask your health care provider.  Do not use street drugs.  Do not share needles.  Ask your health care provider for help if you need support or information about quitting drugs. Alcohol use  Do not drink alcohol if your health care provider tells you not to drink.  If you drink alcohol: ? Limit how much you have to 0-2 drinks a day. ? Be aware of how much alcohol is in your drink. In the [place] one drink equals one 12 oz bottle of beer (355 mL), one 5 oz glass of wine (148 mL), or one 1 oz glass of hard liquor (44 mL). General instructions  Schedule regular health, dental, and eye exams.  Stay current with your vaccines.  Tell your health care provider if: ? You often feel depressed. ? You have ever been abused or do not feel safe at home. Summary  Adopting a healthy lifestyle and getting preventive care are important in promoting health and wellness.  Follow your health care provider's instructions about healthy diet, exercising, and getting tested or screened for diseases.  Follow your health care provider's instructions on monitoring your cholesterol and blood pressure. This information is not intended to replace advice given to you by your health care provider. Make sure you  discuss any questions you have with your health care provider. Document Revised: 10/29/2018 Document Reviewed: 10/29/2018 Elsevier Patient Education  2021 [employer].

## 2021-02-21 ENCOUNTER — Telehealth: Payer: Self-pay | Admitting: Family Medicine

## 2021-02-21 NOTE — Telephone Encounter (Signed)
Called and left 2 vm's per signed DPR ( left detailed message) called on 02/20/21 and 02/21/21 needs to reschedule his 03/13/21

## 2021-03-13 ENCOUNTER — Ambulatory Visit: Payer: BC Managed Care – PPO | Admitting: Family Medicine

## 2021-03-21 ENCOUNTER — Ambulatory Visit: Payer: BC Managed Care – PPO | Admitting: Family Medicine

## 2021-03-21 ENCOUNTER — Encounter: Payer: Self-pay | Admitting: Family Medicine

## 2021-03-21 ENCOUNTER — Other Ambulatory Visit: Payer: Self-pay

## 2021-03-21 VITALS — BP 138/82 | HR 85 | Temp 97.6°F | Ht 67.5 in | Wt 188.1 lb

## 2021-03-21 DIAGNOSIS — N289 Disorder of kidney and ureter, unspecified: Secondary | ICD-10-CM

## 2021-03-21 DIAGNOSIS — I10 Essential (primary) hypertension: Secondary | ICD-10-CM | POA: Diagnosis not present

## 2021-03-21 MED ORDER — AMLODIPINE BESY-BENAZEPRIL HCL 5-10 MG PO CAPS: 1.0000 | ORAL_CAPSULE | Freq: Every day | ORAL | 6 refills | Status: DC

## 2021-03-21 NOTE — Progress Notes (Signed)
Patient ID: Shawn Vaughn, male    DOB: [birth year], 50 y.o.   MRN: [id number]  This visit was conducted in person.  BP 138/82   Pulse 85   Temp 97.6 F (36.4 C) (Temporal)   Ht 5' 7.5" (1.715 m)   Wt 188 lb 2 oz (85.3 kg)   SpO2 99%   BMI 29.03 kg/m   BP Readings from Last 3 Encounters:  03/21/21 138/82  12/13/20 (!) 158/100  08/24/20 122/86    CC: 3 mo HTN f/u visit  Subjective:   HPI: Shawn Vaughn is a 49 y.o. male presenting on 03/21/2021 for Hypertension (Here for 3 mo f/u.)   HTN - Compliant with current antihypertensive regimen of amlodipine 61m daily, toprol XL 1028mdaily. Does check blood pressures at home: 130/80s. 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 No low blood pressure readings or symptoms of dizziness/syncope. Denies HA, vision changes, CP/tightness, SOB, leg swelling.      Relevant past medical, surgical, family and social history reviewed and updated as indicated. Interim medical history since our last visit reviewed. Allergies and medications reviewed and updated. Outpatient Medications Prior to Visit  Medication Sig Dispense Refill  . ascorbic acid (VITAMIN C) 500 MG tablet Take 1 tablet (500 mg total) by mouth daily.    . Cholecalciferol (VITAMIN D3) 25 MCG (1000 UT) CAPS Take 1 capsule (1,000 Units total) by mouth daily. 30 capsule   . fenofibrate (TRICOR) 145 MG tablet Take 1 tablet (145 mg total) by mouth daily. 90 tablet 3  . metoprolol succinate (TOPROL-XL) 100 MG 24 hr tablet Take 1 tablet (100 mg total) by mouth daily. Take with or immediately following a meal. 90 tablet 3  . amLODipine (NORVASC) 10 MG tablet Take 1 tablet (10 mg total) by mouth daily. 90 tablet 3   No facility-administered medications prior to visit.     Per HPI unless specifically indicated in ROS section below Review of Systems Objective:  BP 138/82   Pulse 85   Temp 97.6 F (36.4 C) (Temporal)   Ht 5' 7.5" (1.715 m)   Wt 188 lb 2 oz (85.3  kg)   SpO2 99%   BMI 29.03 kg/m   Wt Readings from Last 3 Encounters:  03/21/21 188 lb 2 oz (85.3 kg)  12/13/20 188 lb 2 oz (85.3 kg)  08/24/20 189 lb (85.7 kg)      Physical Exam Vitals and nursing note reviewed.  Constitutional:      Appearance: Normal appearance. He is not ill-appearing.  Eyes:     Extraocular Movements: Extraocular movements intact.     Conjunctiva/sclera: Conjunctivae normal.     Pupils: Pupils are equal, round, and reactive to light.  Cardiovascular:     Rate and Rhythm: Normal rate and regular rhythm.     Pulses: Normal pulses.     Heart sounds: Normal heart sounds. No murmur heard.   Pulmonary:     Effort: Pulmonary effort is normal. No respiratory distress.     Breath sounds: Normal breath sounds. No wheezing, rhonchi or rales.  Musculoskeletal:     Right lower leg: No edema.     Left lower leg: No edema.  Skin:    General: Skin is warm and dry.  Neurological:     Mental Status: He is alert.  Psychiatric:        Mood and Affect: Mood normal.        Behavior: Behavior normal.  Results for orders placed or performed in visit on 12/02/20  PSA  Result Value Ref Range   PSA 0.42 0.10 - 4.00 ng/mL  Microalbumin / creatinine urine ratio  Result Value Ref Range   Microalb, Ur <0.7 0.0 - 1.9 mg/dL   Creatinine,U 126.0 mg/dL   Microalb Creat Ratio 0.6 0.0 - 30.0 mg/g  VITAMIN D 25 Hydroxy (Vit-D Deficiency, Fractures)  Result Value Ref Range   VITD 43.50 30.00 - 100.00 ng/mL  Comprehensive metabolic panel  Result Value Ref Range   Sodium 139 135 - 145 mEq/L   Potassium 3.7 3.5 - 5.1 mEq/L   Chloride 100 96 - 112 mEq/L   CO2 30 19 - 32 mEq/L   Glucose, Bld 85 70 - 99 mg/dL   BUN 22 6 - 23 mg/dL   Creatinine, Ser 1.50 0.40 - 1.50 mg/dL   Total Bilirubin 0.7 0.2 - 1.2 mg/dL   Alkaline Phosphatase 43 39 - 117 U/L   AST 24 0 - 37 U/L   ALT 25 0 - 53 U/L   Total Protein 8.1 6.0 - 8.3 g/dL   Albumin 4.7 3.5 - 5.2 g/dL   GFR 54.88 (L)  >60.00 mL/min   Calcium 10.0 8.4 - 10.5 mg/dL  Lipid panel  Result Value Ref Range   Cholesterol 124 0 - 200 mg/dL   Triglycerides 112.0 0.0 - 149.0 mg/dL   HDL 37.90 (L) >39.00 mg/dL   VLDL 22.4 0.0 - 40.0 mg/dL   LDL Cholesterol 64 0 - 99 mg/dL   Total CHOL/HDL Ratio 3    NonHDL 86.01    Assessment & Plan:  This visit occurred during the SARS-CoV-2 public health emergency.  Safety protocols were in place, including screening questions prior to the visit, additional usage of staff PPE, and extensive cleaning of exam room while observing appropriate contact time as indicated for disinfecting solutions.   Problem List Items Addressed This Visit    HTN (hypertension) - Primary    Chronic, adequate however home readings largely 130-140/80-90s, would ideally want better BP control. Will change plain amlodipine 35m to lotrel (amlodipine/benazepril) 5/130mdaily, continue toprol XL 10067maily. Reviewed ACEI side effects and allergy to monitor for. RTC 1-2 wks lab visit only, RTC 3 mo HTN f/u visit. Pt agrees with plan.  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       Relevant Medications   amLODipine-benazepril (LOTREL) 5-10 MG capsule   Renal insufficiency    RTC 1-2 wks after starting ACEI to check renal function.       Relevant Orders   Renal function panel       Meds ordered this encounter  Medications  . amLODipine-benazepril (LOTREL) 5-10 MG capsule    Sig: Take 1 capsule by mouth daily.    Dispense:  30 capsule    Refill:  6    In place of amlodipine   Orders Placed This Encounter  Procedures  . Renal function panel    Standing Status:   Future    Standing Expiration Date:   03/21/2022    Follow up plan: Return in about 3 months (around 06/21/2021), or if symptoms worsen or fail to improve, for follow up visit.  JavRia BushD

## 2021-03-21 NOTE — Patient Instructions (Signed)
BP stable but stays borderline elevated Start combination amlodipine/benazepril 5/7m in place of plain amlodipine 155m Continue Toprol XL (metoprolol) 10017maily.  Watch for dry nagging cough (side effect to benazepril) or tongue/lip swelling (allergy to benazepril).  DOT forms filled out today.  Return in 1-2 weeks for lab visit only to check kidney function on new medicines.  Keep track of blood pressures at home, let us [place] if any trouble. If doing well with new combo medicine, we will send in 90 day supply.  Return in 3 months for BP f/u visit.

## 2021-03-21 NOTE — Assessment & Plan Note (Signed)
Chronic, adequate however home readings largely 130-140/80-90s, would ideally want better BP control. Will change plain amlodipine 56m to lotrel (amlodipine/benazepril) 5/158mdaily, continue toprol XL 10068maily. Reviewed ACEI side effects and allergy to monitor for. RTC 1-2 wks lab visit only, RTC 3 mo HTN f/u visit. Pt agrees with plan.  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

## 2021-03-21 NOTE — Assessment & Plan Note (Signed)
RTC 1-2 wks after starting ACEI to check renal function.

## 2021-03-22 ENCOUNTER — Ambulatory Visit: Payer: BC Managed Care – PPO | Admitting: Family Medicine

## 2021-03-28 ENCOUNTER — Other Ambulatory Visit: Payer: Self-pay

## 2021-03-28 ENCOUNTER — Other Ambulatory Visit (INDEPENDENT_AMBULATORY_CARE_PROVIDER_SITE_OTHER): Payer: BC Managed Care – PPO

## 2021-03-28 DIAGNOSIS — N289 Disorder of kidney and ureter, unspecified: Secondary | ICD-10-CM | POA: Diagnosis not present

## 2021-03-28 LAB — RENAL FUNCTION PANEL
Albumin: 4.4 g/dL (ref 3.5–5.2)
BUN: 21 mg/dL (ref 6–23)
CO2: 28 mEq/L (ref 19–32)
Calcium: 9.4 mg/dL (ref 8.4–10.5)
Chloride: 101 mEq/L (ref 96–112)
Creatinine, Ser: 1.58 mg/dL — ABNORMAL HIGH (ref 0.40–1.50)
GFR: 51.45 mL/min — ABNORMAL LOW (ref >60.00)
Glucose, Bld: 91 mg/dL (ref 70–99)
Phosphorus: 3.8 mg/dL (ref 2.3–4.6)
Potassium: 3.9 mEq/L (ref 3.5–5.1)
Sodium: 138 mEq/L (ref 135–145)

## 2021-04-19 DIAGNOSIS — B029 Zoster without complications: Secondary | ICD-10-CM

## 2021-04-19 HISTORY — DX: Zoster without complications: B02.9

## 2021-05-19 ENCOUNTER — Encounter: Payer: Self-pay | Admitting: Family Medicine

## 2021-05-19 ENCOUNTER — Other Ambulatory Visit: Payer: Self-pay

## 2021-05-19 ENCOUNTER — Ambulatory Visit: Payer: BC Managed Care – PPO | Admitting: Family Medicine

## 2021-05-19 DIAGNOSIS — I10 Essential (primary) hypertension: Secondary | ICD-10-CM | POA: Diagnosis not present

## 2021-05-19 MED ORDER — AMLODIPINE BESY-BENAZEPRIL HCL 10-20 MG PO CAPS: 1.0000 | ORAL_CAPSULE | Freq: Every day | ORAL | 1 refills | Status: DC

## 2021-05-19 NOTE — Progress Notes (Signed)
Patient ID: Shawn Vaughn, male    DOB: Oct 05, 1971, 50 y.o.   MRN: [id number]  This visit was conducted in person.  BP (!) 164/100   Pulse 86   Temp 98 F (36.7 C) (Temporal)   Ht 5' 7.5" (1.715 m)   Wt 184 lb 9 oz (83.7 kg)   SpO2 99%   BMI 28.48 kg/m   BP 160/110 on recheck   CC: HTN f/u visit  Subjective:   HPI: Shawn Vaughn is a 50 y.o. male presenting on 05/19/2021 for Hypertension (C/o persistent elevated BP readings. )   HTN - Compliant with current antihypertensive regimen of toprol XL 150m daily, lotrel 5/15mdaily. Does check blood pressures at home: 140/90s.  No low blood pressure readings or symptoms of dizziness/syncope.  Denies HA, vision changes, CP/tightness, SOB, leg swelling.    [facility] dx with [facility] noted BP elevated at that time. Treated with cream.      Relevant past medical, surgical, family and social history reviewed and updated as indicated. Interim medical history since our last visit reviewed. Allergies and medications reviewed and updated. Outpatient Medications Prior to Visit  Medication Sig Dispense Refill   Cholecalciferol (VITAMIN D3) 25 MCG (1000 UT) CAPS Take 1 capsule (1,000 Units total) by mouth daily. 30 capsule    fenofibrate (TRICOR) 145 MG tablet Take 1 tablet (145 mg total) by mouth daily. 90 tablet 3   metoprolol succinate (TOPROL-XL) 100 MG 24 hr tablet Take 1 tablet (100 mg total) by mouth daily. Take with or immediately following a meal. 90 tablet 3   amLODipine-benazepril (LOTREL) 5-10 MG capsule Take 1 capsule by mouth daily. 30 capsule 6   ascorbic acid (VITAMIN C) 500 MG tablet Take 1 tablet (500 mg total) by mouth daily.     No facility-administered medications prior to visit.     Per HPI unless specifically indicated in ROS section below Review of Systems  Objective:  BP (!) 164/100   Pulse 86   Temp 98 F (36.7 C) (Temporal)   Ht 5' 7.5" (1.715 m)   Wt 184 lb 9 oz (83.7 kg)    SpO2 99%   BMI 28.48 kg/m   Wt Readings from Last 3 Encounters:  05/19/21 184 lb 9 oz (83.7 kg)  03/21/21 188 lb 2 oz (85.3 kg)  12/13/20 188 lb 2 oz (85.3 kg)      Physical Exam Vitals and nursing note reviewed.  Constitutional:      Appearance: Normal appearance. He is not ill-appearing.  Eyes:     Extraocular Movements: Extraocular movements intact.     Pupils: Pupils are equal, round, and reactive to light.  Neck:     Thyroid: No thyroid mass or thyromegaly.  Cardiovascular:     Rate and Rhythm: Normal rate and regular rhythm.     Pulses: Normal pulses.     Heart sounds: Normal heart sounds. No murmur heard. Pulmonary:     Effort: Pulmonary effort is normal. No respiratory distress.     Breath sounds: Normal breath sounds. No wheezing, rhonchi or rales.  Musculoskeletal:     Cervical back: Normal range of motion and neck supple. No rigidity.     Right lower leg: No edema.     Left lower leg: No edema.  Lymphadenopathy:     Cervical: No cervical adenopathy.  Skin:    General: Skin is warm and dry.     Findings: No rash.  Comments: Rash to L flank fully resolved  Neurological:     Mental Status: He is alert.  Psychiatric:        Mood and Affect: Mood normal.        Behavior: Behavior normal.      Results for orders placed or performed in visit on 03/28/21  Renal function panel  Result Value Ref Range   Sodium 138 135 - 145 mEq/L   Potassium 3.9 3.5 - 5.1 mEq/L   Chloride 101 96 - 112 mEq/L   CO2 28 19 - 32 mEq/L   Albumin 4.4 3.5 - 5.2 g/dL   BUN 21 6 - 23 mg/dL   Creatinine, Ser 1.58 (H) 0.40 - 1.50 mg/dL   Glucose, Bld 91 70 - 99 mg/dL   Phosphorus 3.8 2.3 - 4.6 mg/dL   GFR 51.45 (L) >60.00 mL/min   Calcium 9.4 8.4 - 10.5 mg/dL    Assessment & Plan:  This visit occurred during the SARS-CoV-2 public health emergency.  Safety protocols were in place, including screening questions prior to the visit, additional usage of staff PPE, and extensive cleaning  of exam room while observing appropriate contact time as indicated for disinfecting solutions.   Problem List Items Addressed This Visit     HTN (hypertension)    Chronic, deteriorated. Will increase lotrel to 10/68m, continue Toprol XL 1037mdaily.  RTC 1 wk lab visit only.  Recheck at 1 mo f/u visit already scheduled, update sooner if persistently elevated readings at home.  Pt agrees with plan.        Relevant Medications   amLODipine-benazepril (LOTREL) 10-20 MG capsule   Other Relevant Orders   Renal function panel     Meds ordered this encounter  Medications   amLODipine-benazepril (LOTREL) 10-20 MG capsule    Sig: Take 1 capsule by mouth daily.    Dispense:  90 capsule    Refill:  1   Orders Placed This Encounter  Procedures   Renal function panel    Standing Status:   Future    Standing Expiration Date:   05/19/2022     Patient Instructions  Blood pressures are staying elevated. Increase lotrel (amlodipine/benazepril) to 10/2060maily (double of current dose). You can take 2 tablets at a time until you run out.  Good to see you today. Return in 1 week after starting higher dose for labs only.  Keep August appointment.  Follow up plan: Return in about 4 weeks (around 06/16/2021) for follow up visit.  JavRia BushD

## 2021-05-19 NOTE — Patient Instructions (Addendum)
Blood pressures are staying elevated. Increase lotrel (amlodipine/benazepril) to 10/22m daily (double of current dose). You can take 2 tablets at a time until you run out.  Good to see you today. Return in 1 week after starting higher dose for labs only.  Keep August appointment.

## 2021-05-19 NOTE — Assessment & Plan Note (Signed)
Chronic, deteriorated. Will increase lotrel to 10/42m, continue Toprol XL 1073mdaily.  RTC 1 wk lab visit only.  Recheck at 1 mo f/u visit already scheduled, update sooner if persistently elevated readings at home.  Pt agrees with plan.

## 2021-05-29 ENCOUNTER — Other Ambulatory Visit: Payer: Self-pay

## 2021-05-29 ENCOUNTER — Other Ambulatory Visit (INDEPENDENT_AMBULATORY_CARE_PROVIDER_SITE_OTHER): Payer: BC Managed Care – PPO

## 2021-05-29 DIAGNOSIS — I10 Essential (primary) hypertension: Secondary | ICD-10-CM

## 2021-05-29 LAB — RENAL FUNCTION PANEL
Albumin: 4.4 g/dL (ref 3.5–5.2)
BUN: 13 mg/dL (ref 6–23)
CO2: 28 mEq/L (ref 19–32)
Calcium: 9.5 mg/dL (ref 8.4–10.5)
Chloride: 103 mEq/L (ref 96–112)
Creatinine, Ser: 1.47 mg/dL (ref 0.40–1.50)
GFR: 56.03 mL/min — ABNORMAL LOW (ref >60.00)
Glucose, Bld: 93 mg/dL (ref 70–99)
Phosphorus: 2.9 mg/dL (ref 2.3–4.6)
Potassium: 3.7 mEq/L (ref 3.5–5.1)
Sodium: 140 mEq/L (ref 135–145)

## 2021-06-23 ENCOUNTER — Other Ambulatory Visit: Payer: Self-pay

## 2021-06-23 ENCOUNTER — Ambulatory Visit (INDEPENDENT_AMBULATORY_CARE_PROVIDER_SITE_OTHER): Payer: BC Managed Care – PPO | Admitting: Family Medicine

## 2021-06-23 ENCOUNTER — Encounter: Payer: Self-pay | Admitting: Family Medicine

## 2021-06-23 DIAGNOSIS — I10 Essential (primary) hypertension: Secondary | ICD-10-CM | POA: Diagnosis not present

## 2021-06-23 NOTE — Progress Notes (Signed)
Patient ID: Shawn Vaughn, male    DOB: 1970-11-29, 50 y.o.   MRN: [id number]  This visit was conducted in person.  BP 138/88   Pulse 76   Temp 97.6 F (36.4 C) (Temporal)   Ht 5' 7.5" (1.715 m)   Wt 186 lb 4 oz (84.5 kg)   SpO2 98%   BMI 28.74 kg/m    CC: 3 mo HTN f/u visit  Subjective:   HPI: Shawn Vaughn is a 50 y.o. male presenting on 06/23/2021 for Hypertension (Here for 3 mo f/u.)    HTN - Compliant with current antihypertensive regimen of lotrel 10/7m daily, toprol XL 1037mdaily. Does check blood pressures at home: well controlled at home. No low blood pressure readings or symptoms of dizziness/syncope.  Denies HA, vision changes, CP/tightness, SOB. Notes dependent edema at ankles at the end of the day. This is not bothersome.   [place]. Struggled for 24 hours after this.      Relevant past medical, surgical, family and social history reviewed and updated as indicated. Interim medical history since our last visit reviewed. Allergies and medications reviewed and updated. Outpatient Medications Prior to Visit  Medication Sig Dispense Refill   amLODipine-benazepril (LOTREL) 10-20 MG capsule Take 1 capsule by mouth daily. 90 capsule 1   Cholecalciferol (VITAMIN D3) 25 MCG (1000 UT) CAPS Take 1 capsule (1,000 Units total) by mouth daily. 30 capsule    fenofibrate (TRICOR) 145 MG tablet Take 1 tablet (145 mg total) by mouth daily. 90 tablet 3   metoprolol succinate (TOPROL-XL) 100 MG 24 hr tablet Take 1 tablet (100 mg total) by mouth daily. Take with or immediately following a meal. 90 tablet 3   No facility-administered medications prior to visit.     Per HPI unless specifically indicated in ROS section below Review of Systems  Objective:  BP 138/88   Pulse 76   Temp 97.6 F (36.4 C) (Temporal)   Ht 5' 7.5" (1.715 m)   Wt 186 lb 4 oz (84.5 kg)   SpO2 98%   BMI 28.74 kg/m   Wt Readings from Last 3 Encounters:  06/23/21  186 lb 4 oz (84.5 kg)  05/19/21 184 lb 9 oz (83.7 kg)  03/21/21 188 lb 2 oz (85.3 kg)      Physical Exam Vitals and nursing note reviewed.  Constitutional:      Appearance: Normal appearance. He is not ill-appearing.  Eyes:     Extraocular Movements: Extraocular movements intact.     Pupils: Pupils are equal, round, and reactive to light.  Neck:     Thyroid: No thyroid mass or thyromegaly.  Cardiovascular:     Rate and Rhythm: Normal rate and regular rhythm.     Pulses: Normal pulses.     Heart sounds: Normal heart sounds. No murmur heard. Pulmonary:     Effort: Pulmonary effort is normal. No respiratory distress.     Breath sounds: Normal breath sounds. No wheezing, rhonchi or rales.  Musculoskeletal:     Cervical back: Normal range of motion and neck supple. No rigidity.     Right lower leg: No edema.     Left lower leg: No edema.  Lymphadenopathy:     Cervical: No cervical adenopathy.  Skin:    General: Skin is warm and dry.     Findings: No rash.  Neurological:     Mental Status: He is alert.  Psychiatric:  Mood and Affect: Mood normal.        Behavior: Behavior normal.      Results for orders placed or performed in visit on 05/29/21  Renal function panel  Result Value Ref Range   Sodium 140 135 - 145 mEq/L   Potassium 3.7 3.5 - 5.1 mEq/L   Chloride 103 96 - 112 mEq/L   CO2 28 19 - 32 mEq/L   Albumin 4.4 3.5 - 5.2 g/dL   BUN 13 6 - 23 mg/dL   Creatinine, Ser 1.47 0.40 - 1.50 mg/dL   Glucose, Bld 93 70 - 99 mg/dL   Phosphorus 2.9 2.3 - 4.6 mg/dL   GFR 56.03 (L) >60.00 mL/min   Calcium 9.5 8.4 - 10.5 mg/dL    Assessment & Plan:  This visit occurred during the SARS-CoV-2 public health emergency.  Safety protocols were in place, including screening questions prior to the visit, additional usage of staff PPE, and extensive cleaning of exam room while observing appropriate contact time as indicated for disinfecting solutions.   Problem List Items Addressed  This Visit     HTN (hypertension)    Chronic, improved readings on current regimen - continue lotrel 10/20 and toprol XL 177m daily.          No orders of the defined types were placed in this encounter.  No orders of the defined types were placed in this encounter.   Patient Instructions  You are doing well today  Return as needed or in 6 months for physical.   Follow up plan: Return in about 6 months (around 12/24/2021) for annual exam, prior fasting for blood work.  JRia Bush MD

## 2021-06-23 NOTE — Assessment & Plan Note (Addendum)
Chronic, improved readings on current regimen - continue lotrel 10/20 and toprol XL 181m daily.

## 2021-06-23 NOTE — Patient Instructions (Addendum)
You are doing well today  Return as needed or in 6 months for physical.

## 2021-11-20 ENCOUNTER — Other Ambulatory Visit: Payer: Self-pay | Admitting: Family Medicine

## 2021-12-26 ENCOUNTER — Encounter: Payer: Self-pay | Admitting: Family Medicine

## 2021-12-26 ENCOUNTER — Other Ambulatory Visit: Payer: Self-pay | Admitting: Family Medicine

## 2021-12-26 DIAGNOSIS — E781 Pure hyperglyceridemia: Secondary | ICD-10-CM

## 2021-12-26 DIAGNOSIS — N289 Disorder of kidney and ureter, unspecified: Secondary | ICD-10-CM

## 2021-12-26 DIAGNOSIS — E559 Vitamin D deficiency, unspecified: Secondary | ICD-10-CM

## 2021-12-26 DIAGNOSIS — Z125 Encounter for screening for malignant neoplasm of prostate: Secondary | ICD-10-CM

## 2022-01-05 ENCOUNTER — Other Ambulatory Visit: Payer: Self-pay

## 2022-01-05 ENCOUNTER — Other Ambulatory Visit (INDEPENDENT_AMBULATORY_CARE_PROVIDER_SITE_OTHER): Payer: BC Managed Care – PPO

## 2022-01-05 DIAGNOSIS — E781 Pure hyperglyceridemia: Secondary | ICD-10-CM | POA: Diagnosis not present

## 2022-01-05 DIAGNOSIS — N289 Disorder of kidney and ureter, unspecified: Secondary | ICD-10-CM

## 2022-01-05 DIAGNOSIS — Z125 Encounter for screening for malignant neoplasm of prostate: Secondary | ICD-10-CM

## 2022-01-05 DIAGNOSIS — E559 Vitamin D deficiency, unspecified: Secondary | ICD-10-CM

## 2022-01-05 LAB — CBC WITH DIFFERENTIAL/PLATELET
Basophils Absolute: 0 10*3/uL (ref 0.0–0.1)
Basophils Relative: 0.7 % (ref 0.0–3.0)
Eosinophils Absolute: 0.2 10*3/uL (ref 0.0–0.7)
Eosinophils Relative: 3.6 % (ref 0.0–5.0)
HCT: 42.5 % (ref 39.0–52.0)
Hemoglobin: 14.4 g/dL (ref 13.0–17.0)
Lymphocytes Relative: 47.1 % — ABNORMAL HIGH (ref 12.0–46.0)
Lymphs Abs: 2.2 10*3/uL (ref 0.7–4.0)
MCHC: 33.9 g/dL (ref 30.0–36.0)
MCV: 88.4 fl (ref 78.0–100.0)
Monocytes Absolute: 0.4 10*3/uL (ref 0.1–1.0)
Monocytes Relative: 9 % (ref 3.0–12.0)
Neutro Abs: 1.9 10*3/uL (ref 1.4–7.7)
Neutrophils Relative %: 39.6 % — ABNORMAL LOW (ref 43.0–77.0)
Platelets: 299 10*3/uL (ref 150.0–400.0)
RBC: 4.81 Mil/uL (ref 4.22–5.81)
RDW: 13.9 % (ref 11.5–15.5)
WBC: 4.7 10*3/uL (ref 4.0–10.5)

## 2022-01-05 LAB — LIPID PANEL
Cholesterol: 115 mg/dL (ref 0–200)
HDL: 37.6 mg/dL — ABNORMAL LOW (ref >39.00)
LDL Cholesterol: 58 mg/dL (ref 0–99)
NonHDL: 76.91
Total CHOL/HDL Ratio: 3
Triglycerides: 93 mg/dL (ref 0.0–149.0)
VLDL: 18.6 mg/dL (ref 0.0–40.0)

## 2022-01-05 LAB — PSA: PSA: 0.37 ng/mL (ref 0.10–4.00)

## 2022-01-05 LAB — VITAMIN D 25 HYDROXY (VIT D DEFICIENCY, FRACTURES): VITD: 40.03 ng/mL (ref 30.00–100.00)

## 2022-01-05 LAB — COMPREHENSIVE METABOLIC PANEL
ALT: 22 U/L (ref 0–53)
AST: 24 U/L (ref 0–37)
Albumin: 4.7 g/dL (ref 3.5–5.2)
Alkaline Phosphatase: 38 U/L — ABNORMAL LOW (ref 39–117)
BUN: 21 mg/dL (ref 6–23)
CO2: 32 mEq/L (ref 19–32)
Calcium: 9.9 mg/dL (ref 8.4–10.5)
Chloride: 101 mEq/L (ref 96–112)
Creatinine, Ser: 1.6 mg/dL — ABNORMAL HIGH (ref 0.40–1.50)
GFR: 50.4 mL/min — ABNORMAL LOW (ref >60.00)
Glucose, Bld: 81 mg/dL (ref 70–99)
Potassium: 3.8 mEq/L (ref 3.5–5.1)
Sodium: 139 mEq/L (ref 135–145)
Total Bilirubin: 0.8 mg/dL (ref 0.2–1.2)
Total Protein: 8.1 g/dL (ref 6.0–8.3)

## 2022-01-06 LAB — PARATHYROID HORMONE, INTACT (NO CA): PTH: 25 pg/mL (ref 16–77)

## 2022-01-12 ENCOUNTER — Encounter: Payer: Self-pay | Admitting: Family Medicine

## 2022-01-12 ENCOUNTER — Ambulatory Visit (INDEPENDENT_AMBULATORY_CARE_PROVIDER_SITE_OTHER): Payer: BC Managed Care – PPO | Admitting: Family Medicine

## 2022-01-12 ENCOUNTER — Other Ambulatory Visit: Payer: Self-pay

## 2022-01-12 VITALS — BP 142/94 | HR 88 | Temp 97.6°F | Ht 67.5 in | Wt 180.4 lb

## 2022-01-12 DIAGNOSIS — N1831 Chronic kidney disease, stage 3a: Secondary | ICD-10-CM | POA: Diagnosis not present

## 2022-01-12 DIAGNOSIS — I10 Essential (primary) hypertension: Secondary | ICD-10-CM

## 2022-01-12 DIAGNOSIS — R0683 Snoring: Secondary | ICD-10-CM

## 2022-01-12 DIAGNOSIS — Z0000 Encounter for general adult medical examination without abnormal findings: Secondary | ICD-10-CM | POA: Diagnosis not present

## 2022-01-12 DIAGNOSIS — E559 Vitamin D deficiency, unspecified: Secondary | ICD-10-CM

## 2022-01-12 DIAGNOSIS — E781 Pure hyperglyceridemia: Secondary | ICD-10-CM | POA: Diagnosis not present

## 2022-01-12 MED ORDER — AMLODIPINE BESY-BENAZEPRIL HCL 10-20 MG PO CAPS: 1.0000 | ORAL_CAPSULE | Freq: Every day | ORAL | 4 refills | Status: DC

## 2022-01-12 MED ORDER — METOPROLOL SUCCINATE ER 100 MG PO TB24: 100.0000 mg | ORAL_TABLET | Freq: Every day | ORAL | 4 refills | Status: DC

## 2022-01-12 MED ORDER — FENOFIBRATE 145 MG PO TABS: 145.0000 mg | ORAL_TABLET | Freq: Every day | ORAL | 4 refills | Status: DC

## 2022-01-12 NOTE — Assessment & Plan Note (Signed)
Chronic, stable on current regimen of fenofibrate - continue this. The ASCVD Risk score (Arnett DK, et al., 2019) failed to calculate for the following reasons:   The valid total cholesterol range is 130 to 320 mg/dL

## 2022-01-12 NOTE — Patient Instructions (Addendum)
EKG today  You are doing well today but blood pressures are staying a bit higher than ideal (goal <135/85, ideally lower).  Monitor blood pressures at home over next few weeks and send [place] readings. This will determine need to add another BP medicine. Return in 3 months for follow up hypertension, come in 1 week prior for labs.  Good to see you today.  Chronic Kidney Disease, Adult Chronic kidney disease (CKD) occurs when the kidneys are slowly and permanently damaged over a long period of time. The kidneys are a pair of organs that do many important jobs in the body, including: Removing waste and extra fluid from the blood to make urine. Making hormones that maintain the amount of fluid in tissues and blood vessels. Maintaining the right amount of fluids and chemicals in the body. A small amount of kidney damage may not cause problems, but a large amount of damage may make it hard or impossible for the kidneys to work right. Steps must be taken to slow kidney damage or to stop it from getting worse. If steps are not taken, the kidneys may stop working permanently (end-stage renal disease, or ESRD). Most of the time, CKD does not go away, but it can often be controlled. People who have CKD are usually able to live full lives. What are the causes? The most common causes of this condition are diabetes and high blood pressure (hypertension). Other causes include: Cardiovascular diseases. These affect the heart and blood vessels. Kidney diseases. These include: Glomerulonephritis, or inflammation of the tiny filters in the kidneys. Interstitial nephritis. This is swelling of the small tubes of the kidneys and of the surrounding structures. Polycystic kidney disease, in which clusters of fluid-filled sacs form within the kidneys. Renal vascular disease. This includes disorders that affect the arteries and veins of the kidneys. Diseases that affect the body's defense system (immune system). A problem  with urine flow. This may be caused by: Kidney stones. Cancer. An enlarged prostate, in males. A kidney infection or urinary tract infection (UTI) that keeps coming back. Vasculitis. This is swelling or inflammation of the blood vessels. What increases the risk? Your chances of having kidney disease increase with age. The following factors may make you more likely to develop this condition: A family history of kidney disease or kidney failure. Kidney failure means the kidneys can no longer work right. Certain genetic diseases. Taking medicines often that are damaging to the kidneys. Being around or being in contact with toxic substances. Obesity. A history of tobacco use. What are the signs or symptoms? Symptoms of this condition include: Feeling very tired (lethargic) and having less energy. Swelling, or edema, of the face, legs, ankles, or feet. Nausea or vomiting, or loss of appetite. Confusion or trouble concentrating. Muscle twitches and cramps, especially in the legs. Dry, itchy skin. A metallic taste in the mouth. Producing less urine, or producing more urine (especially at night). Shortness of breath. Trouble sleeping. CKD may also result in not having enough red blood cells or hemoglobin in the blood (anemia) or having weak bones (bone disease). Symptoms develop slowly and may not be obvious until the kidney damage becomes severe. It is possible to have kidney disease for years without having symptoms. How is this diagnosed? This condition may be diagnosed based on: Blood tests. Urine tests. Imaging tests, such as an ultrasound or a CT scan. A kidney biopsy. This involves removing a sample of kidney tissue to be looked at under a microscope.  Results from these tests will help to determine how serious the CKD is. How is this treated? There is no cure for most cases of this condition, but treatment usually relieves symptoms and prevents or slows the worsening of the  disease. Treatment may include: Diet changes, which may require you to avoid alcohol and foods that are high in salt, potassium, phosphorous, and protein. Medicines. These may: Lower blood pressure. Control blood sugar (glucose). Relieve anemia. Relieve swelling. Protect your bones. Improve the balance of salts and minerals in your blood (electrolytes). Dialysis, which is a type of treatment that removes toxic waste from the body. It may be needed if you have kidney failure. Managing any other conditions that are causing your CKD or making it worse. Follow these instructions at home: Medicines Take over-the-counter and prescription medicines only as told by your health care provider. The amount of some medicines that you take may need to be changed. Do not take any new medicines unless approved by your health care provider. Many medicines can make kidney damage worse. Do not take any vitamin and mineral supplements unless approved by your health care provider. Many nutritional supplements can make kidney damage worse. Lifestyle  Do not use any products that contain nicotine or tobacco, such as cigarettes, e-cigarettes, and chewing tobacco. If you need help quitting, ask your health care provider. If you drink alcohol: Limit how much you use to: 0-1 drink a day for women who are not pregnant. 0-2 drinks a day for men. Know how much alcohol is in your drink. In the [place] one drink equals one 12 oz bottle of beer (355 mL), one 5 oz glass of wine (148 mL), or one 1 oz glass of hard liquor (44 mL). Maintain a healthy weight. If you need help, ask your health care provider. General instructions  Follow instructions from your health care provider about eating or drinking restrictions, including any prescribed diet. Track your blood pressure at home. Report changes in your blood pressure as told. If you are being treated for diabetes, track your blood glucose levels as told. Start or continue  an exercise plan. Exercise at least 30 minutes a day, 5 days a week. Keep your immunizations up to date as told. Keep all follow-up visits. This is important. Where to find more information American Association of Kidney Patients: BombTimer.gl National Kidney Foundation: www.kidney.[place]: https://mathis.com/ Life Options: www.lifeoptions.org Kidney School: www.kidneyschool.org Contact a health care provider if: Your symptoms get worse. You develop new symptoms. Get help right away if: You develop symptoms of ESRD. These include: Headaches. Numbness in your hands or feet. Easy bruising. Frequent hiccups. Chest pain. Shortness of breath. Lack of menstrual periods, in women. You have a fever. You are producing less urine than usual. You have pain or bleeding when you urinate or when you have a bowel movement. These symptoms may represent a serious problem that is an emergency. Do not wait to see if the symptoms will go away. Get medical help right away. Call your local emergency services (911 in the [place] Do not drive yourself to the hospital. Summary Chronic kidney disease (CKD) occurs when the kidneys become damaged slowly over a long period of time. The most common causes of this condition are diabetes and high blood pressure (hypertension). There is no cure for most cases of CKD, but treatment usually relieves symptoms and prevents or slows the worsening of the disease. Treatment may include a combination of lifestyle changes, medicines, and dialysis.  This information is not intended to replace advice given to you by your health care provider. Make sure you discuss any questions you have with your health care provider. Document Revised: 02/10/2020 Document Reviewed: 02/10/2020 Elsevier Patient Education  [place].

## 2022-01-12 NOTE — Assessment & Plan Note (Signed)
Updated ESS = 2 Consider OSA testing given difficult to control HTN.

## 2022-01-12 NOTE — Assessment & Plan Note (Signed)
Preventative protocols reviewed and updated unless pt declined. Discussed healthy diet and lifestyle.  

## 2022-01-12 NOTE — Addendum Note (Signed)
Addended by: Ria Bush on: 01/12/2022 08:31 AM   Modules accepted: Orders

## 2022-01-12 NOTE — Addendum Note (Signed)
Addended by: Ria Bush on: 01/12/2022 09:50 AM   Modules accepted: Orders

## 2022-01-12 NOTE — Assessment & Plan Note (Signed)
Reviewed with patient, handout provided. Reviewed latest renal US 2016 WNL.  Consider SGLT2i. Consider eval for RAS.

## 2022-01-12 NOTE — Assessment & Plan Note (Addendum)
Chronic, above ideal despite 3 drug regimen.  I asked him to monitor BP at home over next few weeks and send [place] readings - this will determine need to add 4th BP med.  Will return in 3 months, at that time check aldosterone levels, consider eval for RAS.  Update EKG today as last done 2016

## 2022-01-12 NOTE — Assessment & Plan Note (Addendum)
Levels well replaced on 1000 IU daily.

## 2022-01-12 NOTE — Progress Notes (Addendum)
Patient ID: Shawn Vaughn, male    DOB: [birth year], 51 y.o.   MRN: [id number]  This visit was conducted in person.  BP (!) 142/94 (BP Location: Right Arm, Cuff Size: Large)    Pulse 88    Temp 97.6 F (36.4 C) (Temporal)    Ht 5' 7.5" (1.715 m)    Wt 180 lb 6 oz (81.8 kg)    SpO2 99%    BMI 27.83 kg/m   148/92 on repeat testing  CC: CPE Subjective:   HPI: Shawn Vaughn is a 51 y.o. male presenting on 01/12/2022 for Annual Exam   HTN - stable period on lotrel 10/11m and toprol XL 1013mdaily. Hasn't been checking BP at home. Busy with hunting season [place]    Preventative: Colon cancer screening - Cologuard negative 11/2019 Prostate cancer screening - does have fmhx prostate cancer maternal grandfather age [id number] yearly PSA with intermittent DRE (last done 10/2019).  Lung cancer screening - not eligible  Flu shot yearly, declines this year [place]/20[place], 07/2020, Moderna booster 06/2021, no bivalent Tdap 2014  Seat belt use discussed.  Sunscreen use discussed. No changing moles on skin.  Sleep - averaging 8 hours/night Non smoker  Alcohol - none  Dentist q6 mo  Eye exam - yearly   Caffeine: occasional sodas   Lives with mom, dad but has his own place, 4 dogs   Occupation: [employer] driver [employer]: [employer]   Activity: hunts with dogs 3x/wk, tries to walk 1 mi/day  Diet: good water, fruits/vegetables daily      Relevant past medical, surgical, family and social history reviewed and updated as indicated. Interim medical history since our last visit reviewed. Allergies and medications reviewed and updated. Outpatient Medications Prior to Visit  Medication Sig Dispense Refill   Cholecalciferol (VITAMIN D3) 25 MCG (1000 UT) CAPS Take 1 capsule (1,000 Units total) by mouth daily. 30 capsule    amLODipine-benazepril (LOTREL) 10-20 MG capsule TAKE 1 CAPSULE BY MOUTH EVERY DAY 90 capsule 0   fenofibrate (TRICOR) 145 MG tablet Take 1 tablet (145 mg total) by  mouth daily. 90 tablet 3   metoprolol succinate (TOPROL-XL) 100 MG 24 hr tablet Take 1 tablet (100 mg total) by mouth daily. Take with or immediately following a meal. 90 tablet 3   No facility-administered medications prior to visit.     Per HPI unless specifically indicated in ROS section below Review of Systems  Constitutional:  Negative for activity change, appetite change, chills, fatigue, fever and unexpected weight change.  HENT:  Negative for hearing loss.   Eyes:  Negative for visual disturbance.  Respiratory:  Negative for cough, chest tightness, shortness of breath and wheezing.   Cardiovascular:  Negative for chest pain, palpitations and leg swelling.  Gastrointestinal:  Negative for abdominal distention, abdominal pain, blood in stool, constipation, diarrhea, nausea and vomiting.  Genitourinary:  Negative for difficulty urinating and hematuria.  Musculoskeletal:  Negative for arthralgias, myalgias and neck pain.  Skin:  Negative for rash.  Neurological:  Negative for dizziness, seizures, syncope and headaches.  Hematological:  Negative for adenopathy. Does not bruise/bleed easily.  Psychiatric/Behavioral:  Negative for dysphoric mood. The patient is not nervous/anxious.    Objective:  BP (!) 142/94 (BP Location: Right Arm, Cuff Size: Large)    Pulse 88    Temp 97.6 F (36.4 C) (Temporal)    Ht 5' 7.5" (1.715 m)    Wt 180 lb 6 oz (81.8 kg)  SpO2 99%    BMI 27.83 kg/m   Wt Readings from Last 3 Encounters:  01/12/22 180 lb 6 oz (81.8 kg)  06/23/21 186 lb 4 oz (84.5 kg)  05/19/21 184 lb 9 oz (83.7 kg)      Physical Exam Vitals and nursing note reviewed.  Constitutional:      General: He is not in acute distress.    Appearance: Normal appearance. He is well-developed. He is not ill-appearing.  HENT:     Head: Normocephalic and atraumatic.     Right Ear: Hearing, tympanic membrane, ear canal and external ear normal.     Left Ear: Hearing, tympanic membrane, ear canal  and external ear normal.  Eyes:     General: No scleral icterus.    Extraocular Movements: Extraocular movements intact.     Conjunctiva/sclera: Conjunctivae normal.     Pupils: Pupils are equal, round, and reactive to light.  Neck:     Thyroid: No thyroid mass or thyromegaly.     Vascular: No carotid bruit.  Cardiovascular:     Rate and Rhythm: Normal rate and regular rhythm.     Pulses: Normal pulses.          Radial pulses are 2+ on the right side and 2+ on the left side.     Heart sounds: Normal heart sounds. No murmur heard. Pulmonary:     Effort: Pulmonary effort is normal. No respiratory distress.     Breath sounds: Normal breath sounds. No wheezing, rhonchi or rales.  Abdominal:     General: Bowel sounds are normal. There is no distension.     Palpations: Abdomen is soft. There is no mass.     Tenderness: There is no abdominal tenderness. There is no guarding or rebound.     Hernia: No hernia is present.     Comments: No abdominal bruit appreciated  Musculoskeletal:        General: Normal range of motion.     Cervical back: Normal range of motion and neck supple.     Right lower leg: No edema.     Left lower leg: No edema.  Lymphadenopathy:     Cervical: No cervical adenopathy.  Skin:    General: Skin is warm and dry.     Findings: No rash.  Neurological:     General: No focal deficit present.     Mental Status: He is alert and oriented to person, place, and time.  Psychiatric:        Mood and Affect: Mood normal.        Behavior: Behavior normal.        Thought Content: Thought content normal.        Judgment: Judgment normal.      Results for orders placed or performed in visit on 01/05/22  Parathyroid hormone, intact (no Ca)  Result Value Ref Range   PTH 25 16 - 77 pg/mL  Lipid panel  Result Value Ref Range   Cholesterol 115 0 - 200 mg/dL   Triglycerides 93.0 0.0 - 149.0 mg/dL   HDL 37.60 (L) >39.00 mg/dL   VLDL 18.6 0.0 - 40.0 mg/dL   LDL Cholesterol  58 0 - 99 mg/dL   Total CHOL/HDL Ratio 3    NonHDL 76.91   Comprehensive metabolic panel  Result Value Ref Range   Sodium 139 135 - 145 mEq/L   Potassium 3.8 3.5 - 5.1 mEq/L   Chloride 101 96 - 112 mEq/L   CO2 32  19 - 32 mEq/L   Glucose, Bld 81 70 - 99 mg/dL   BUN 21 6 - 23 mg/dL   Creatinine, Ser 1.60 (H) 0.40 - 1.50 mg/dL   Total Bilirubin 0.8 0.2 - 1.2 mg/dL   Alkaline Phosphatase 38 (L) 39 - 117 U/L   AST 24 0 - 37 U/L   ALT 22 0 - 53 U/L   Total Protein 8.1 6.0 - 8.3 g/dL   Albumin 4.7 3.5 - 5.2 g/dL   GFR 50.40 (L) >60.00 mL/min   Calcium 9.9 8.4 - 10.5 mg/dL  PSA  Result Value Ref Range   PSA 0.37 0.10 - 4.00 ng/mL  CBC with Differential/Platelet  Result Value Ref Range   WBC 4.7 4.0 - 10.5 K/uL   RBC 4.81 4.22 - 5.81 Mil/uL   Hemoglobin 14.4 13.0 - 17.0 g/dL   HCT 42.5 39.0 - 52.0 %   MCV 88.4 78.0 - 100.0 fl   MCHC 33.9 30.0 - 36.0 g/dL   RDW 13.9 11.5 - 15.5 %   Platelets 299.0 150.0 - 400.0 K/uL   Neutrophils Relative % 39.6 (L) 43.0 - 77.0 %   Lymphocytes Relative 47.1 (H) 12.0 - 46.0 %   Monocytes Relative 9.0 3.0 - 12.0 %   Eosinophils Relative 3.6 0.0 - 5.0 %   Basophils Relative 0.7 0.0 - 3.0 %   Neutro Abs 1.9 1.4 - 7.7 K/uL   Lymphs Abs 2.2 0.7 - 4.0 K/uL   Monocytes Absolute 0.4 0.1 - 1.0 K/uL   Eosinophils Absolute 0.2 0.0 - 0.7 K/uL   Basophils Absolute 0.0 0.0 - 0.1 K/uL  VITAMIN D 25 Hydroxy (Vit-D Deficiency, Fractures)  Result Value Ref Range   VITD 40.03 30.00 - 100.00 ng/mL   EKG - NSR rate 60, normal axis, intervals, no hypertrophy, good R wave progression. ST elevation anteriorly likely due to repolarization.   Assessment & Plan:  This visit occurred during the SARS-CoV-2 public health emergency.  Safety protocols were in place, including screening questions prior to the visit, additional usage of staff PPE, and extensive cleaning of exam room while observing appropriate contact time as indicated for disinfecting solutions.   Problem  List Items Addressed This Visit     Encounter for general adult medical examination with abnormal findings - Primary (Chronic)    Preventative protocols reviewed and updated unless pt declined. Discussed healthy diet and lifestyle.       HTN (hypertension)    Chronic, above ideal despite 3 drug regimen.  I asked him to monitor BP at home over next few weeks and send [place] readings - this will determine need to add 4th BP med.  Will return in 3 months, at that time check aldosterone levels, consider eval for RAS.  Update EKG today as last done 2016      Relevant Medications   fenofibrate (TRICOR) 145 MG tablet   metoprolol succinate (TOPROL-XL) 100 MG 24 hr tablet   amLODipine-benazepril (LOTREL) 10-20 MG capsule   Other Relevant Orders   EKG 12-Lead (Completed)   Renal function panel   Aldosterone + renin activity w/ ratio   TSH   Hypertriglyceridemia    Chronic, stable on current regimen of fenofibrate - continue this. The ASCVD Risk score (Arnett DK, et al., 2019) failed to calculate for the following reasons:   The valid total cholesterol range is 130 to 320 mg/dL       Relevant Medications   fenofibrate (TRICOR) 145 MG tablet  metoprolol succinate (TOPROL-XL) 100 MG 24 hr tablet   amLODipine-benazepril (LOTREL) 10-20 MG capsule   CKD (chronic kidney disease) stage 3, GFR 30-59 ml/min (HCC)    Reviewed with patient, handout provided. Reviewed latest renal US 2016 WNL.  Consider SGLT2i. Consider eval for RAS.       Vitamin D deficiency    Levels well replaced on 1000 IU daily.       Snoring    Updated ESS = 2 Consider OSA testing given difficult to control HTN.         Meds ordered this encounter  Medications   fenofibrate (TRICOR) 145 MG tablet    Sig: Take 1 tablet (145 mg total) by mouth daily.    Dispense:  90 tablet    Refill:  4   metoprolol succinate (TOPROL-XL) 100 MG 24 hr tablet    Sig: Take 1 tablet (100 mg total) by mouth daily. Take with or  immediately following a meal.    Dispense:  90 tablet    Refill:  4   amLODipine-benazepril (LOTREL) 10-20 MG capsule    Sig: Take 1 capsule by mouth daily.    Dispense:  90 capsule    Refill:  4   Orders Placed This Encounter  Procedures   Renal function panel    Standing Status:   Future    Standing Expiration Date:   01/12/2023   Aldosterone + renin activity w/ ratio    Standing Status:   Future    Standing Expiration Date:   01/12/2023   TSH    Standing Status:   Future    Standing Expiration Date:   01/12/2023   EKG 12-Lead     Patient instructions: EKG today  You are doing well today but blood pressures are staying a bit higher than ideal (goal <135/85, ideally lower).  Monitor blood pressures at home over next few weeks and send [place] readings. This will determine need to add another BP medicine. Return in 3 months for follow up hypertension, come in 1 week prior for labs.  Good to see you today  Follow up plan: Return in about 3 months (around 04/11/2022) for follow up visit.  Ria Bush, MD

## 2022-01-22 ENCOUNTER — Telehealth: Payer: Self-pay | Admitting: Family Medicine

## 2022-01-22 NOTE — Telephone Encounter (Signed)
Blood pressure readings placed in your in basket for review. ?

## 2022-01-22 NOTE — Telephone Encounter (Signed)
BP log - 123-137/81-87, HR 58-71.  ? ?Home BP readings overall doing well.  ? ?Only BP med that could affect kidney function would be benazepril - and that usually is protective of kidneys. No med changes at this time.  ?I do want him to schedule lab visit sometime this month to repeat kidney check as well as some other tests for further evaluation of high blood pressure.  ?

## 2022-01-22 NOTE — Telephone Encounter (Signed)
Patient notified as instructed by telephone and verbalized understanding. ?Follow-up lab appointment scheduled for 02/06/22 at 8:05 am. ?

## 2022-01-22 NOTE — Telephone Encounter (Signed)
Pt dropped off bp #'s ? ? ?Type of forms received:bp # ? ?Routed to: ?lisa ?Paperwork received by : terrill ? ? ?Individual made aware of 3-5 business day turn around (Y/N):y ? ?Form completed and patient made aware of charges(Y/N): ?y ? ?Faxed to :  ? ?Form location:  dr g box ?

## 2022-02-06 ENCOUNTER — Other Ambulatory Visit (INDEPENDENT_AMBULATORY_CARE_PROVIDER_SITE_OTHER): Payer: BC Managed Care – PPO

## 2022-02-06 ENCOUNTER — Other Ambulatory Visit: Payer: Self-pay

## 2022-02-06 DIAGNOSIS — I10 Essential (primary) hypertension: Secondary | ICD-10-CM | POA: Diagnosis not present

## 2022-02-06 LAB — RENAL FUNCTION PANEL
Albumin: 4.5 g/dL (ref 3.5–5.2)
BUN: 17 mg/dL (ref 6–23)
CO2: 30 mEq/L (ref 19–32)
Calcium: 9.8 mg/dL (ref 8.4–10.5)
Chloride: 101 mEq/L (ref 96–112)
Creatinine, Ser: 1.6 mg/dL — ABNORMAL HIGH (ref 0.40–1.50)
GFR: 50.37 mL/min — ABNORMAL LOW (ref >60.00)
Glucose, Bld: 100 mg/dL — ABNORMAL HIGH (ref 70–99)
Phosphorus: 4 mg/dL (ref 2.3–4.6)
Potassium: 3.8 mEq/L (ref 3.5–5.1)
Sodium: 138 mEq/L (ref 135–145)

## 2022-02-06 LAB — TSH: TSH: 3.38 u[IU]/mL (ref 0.35–5.50)

## 2022-02-13 LAB — ALDOSTERONE + RENIN ACTIVITY W/ RATIO
ALDO / PRA Ratio: 200 Ratio — ABNORMAL HIGH (ref 0.9–28.9)
Aldosterone: 12 ng/dL
Renin Activity: 0.06 ng/mL/h — ABNORMAL LOW (ref 0.25–5.82)

## 2022-02-14 ENCOUNTER — Other Ambulatory Visit: Payer: Self-pay | Admitting: Family Medicine

## 2022-02-15 ENCOUNTER — Encounter: Payer: Self-pay | Admitting: Family Medicine

## 2022-02-15 ENCOUNTER — Other Ambulatory Visit: Payer: Self-pay | Admitting: Family Medicine

## 2022-02-15 DIAGNOSIS — R7989 Other specified abnormal findings of blood chemistry: Secondary | ICD-10-CM

## 2022-02-15 DIAGNOSIS — E269 Hyperaldosteronism, unspecified: Secondary | ICD-10-CM | POA: Insufficient documentation

## 2022-02-15 DIAGNOSIS — I159 Secondary hypertension, unspecified: Secondary | ICD-10-CM

## 2022-02-15 DIAGNOSIS — N1831 Chronic kidney disease, stage 3a: Secondary | ICD-10-CM

## 2022-02-18 ENCOUNTER — Encounter: Payer: Self-pay | Admitting: Family Medicine

## 2022-02-18 DIAGNOSIS — N1831 Chronic kidney disease, stage 3a: Secondary | ICD-10-CM

## 2022-02-18 DIAGNOSIS — E781 Pure hyperglyceridemia: Secondary | ICD-10-CM

## 2022-03-01 ENCOUNTER — Encounter: Payer: Self-pay | Admitting: *Deleted

## 2022-03-29 ENCOUNTER — Other Ambulatory Visit: Payer: Self-pay | Admitting: Nephrology

## 2022-03-29 DIAGNOSIS — N1831 Chronic kidney disease, stage 3a: Secondary | ICD-10-CM

## 2022-03-29 DIAGNOSIS — I10 Essential (primary) hypertension: Secondary | ICD-10-CM

## 2022-04-10 ENCOUNTER — Other Ambulatory Visit (INDEPENDENT_AMBULATORY_CARE_PROVIDER_SITE_OTHER): Payer: Self-pay | Admitting: Nephrology

## 2022-04-10 DIAGNOSIS — I10 Essential (primary) hypertension: Secondary | ICD-10-CM

## 2022-04-11 ENCOUNTER — Ambulatory Visit (INDEPENDENT_AMBULATORY_CARE_PROVIDER_SITE_OTHER): Payer: BC Managed Care – PPO

## 2022-04-11 DIAGNOSIS — I10 Essential (primary) hypertension: Secondary | ICD-10-CM

## 2022-05-04 ENCOUNTER — Other Ambulatory Visit (INDEPENDENT_AMBULATORY_CARE_PROVIDER_SITE_OTHER): Payer: BC Managed Care – PPO

## 2022-05-04 ENCOUNTER — Other Ambulatory Visit: Payer: Self-pay | Admitting: Family Medicine

## 2022-05-04 DIAGNOSIS — N1831 Chronic kidney disease, stage 3a: Secondary | ICD-10-CM | POA: Diagnosis not present

## 2022-05-04 DIAGNOSIS — E781 Pure hyperglyceridemia: Secondary | ICD-10-CM

## 2022-05-04 LAB — RENAL FUNCTION PANEL
Albumin: 4.1 g/dL (ref 3.5–5.2)
BUN: 24 mg/dL — ABNORMAL HIGH (ref 6–23)
CO2: 31 mEq/L (ref 19–32)
Calcium: 9.3 mg/dL (ref 8.4–10.5)
Chloride: 99 mEq/L (ref 96–112)
Creatinine, Ser: 1.45 mg/dL (ref 0.40–1.50)
GFR: 56.59 mL/min — ABNORMAL LOW (ref >60.00)
Glucose, Bld: 93 mg/dL (ref 70–99)
Phosphorus: 4 mg/dL (ref 2.3–4.6)
Potassium: 3.6 mEq/L (ref 3.5–5.1)
Sodium: 139 mEq/L (ref 135–145)

## 2022-05-04 LAB — LIPID PANEL
Cholesterol: 114 mg/dL (ref 0–200)
HDL: 38.6 mg/dL — ABNORMAL LOW (ref >39.00)
LDL Cholesterol: 49 mg/dL (ref 0–99)
NonHDL: 75.64
Total CHOL/HDL Ratio: 3
Triglycerides: 132 mg/dL (ref 0.0–149.0)
VLDL: 26.4 mg/dL (ref 0.0–40.0)

## 2022-05-11 ENCOUNTER — Encounter: Payer: Self-pay | Admitting: Family Medicine

## 2022-05-11 ENCOUNTER — Ambulatory Visit: Payer: BC Managed Care – PPO | Admitting: Family Medicine

## 2022-05-11 VITALS — BP 148/100 | HR 84 | Temp 97.9°F | Ht 67.5 in | Wt 174.2 lb

## 2022-05-11 DIAGNOSIS — I159 Secondary hypertension, unspecified: Secondary | ICD-10-CM | POA: Diagnosis not present

## 2022-05-11 DIAGNOSIS — E781 Pure hyperglyceridemia: Secondary | ICD-10-CM | POA: Diagnosis not present

## 2022-05-11 DIAGNOSIS — N1831 Chronic kidney disease, stage 3a: Secondary | ICD-10-CM | POA: Diagnosis not present

## 2022-05-11 DIAGNOSIS — R634 Abnormal weight loss: Secondary | ICD-10-CM | POA: Insufficient documentation

## 2022-05-11 DIAGNOSIS — R7989 Other specified abnormal findings of blood chemistry: Secondary | ICD-10-CM | POA: Diagnosis not present

## 2022-05-11 NOTE — Assessment & Plan Note (Signed)
Appreciate renal care. F/u appt scheduled next month with Dr Cherylann Ratel.

## 2022-05-15 NOTE — Assessment & Plan Note (Addendum)
Chronic, remains above goal despite lotrel 10\20, toprol XL 100mg , hctz 25mg  daily.  Appreciate nephrology care - has been referred to endo and heme.  Recent SPEP with M spike, recent elevated cortisol as well as aldo/renin ratio.  Check CT abd/pelvis to evaluate for functioning adrenal adenoma.  Will likely add spironolactone as next step pending CT results.

## 2022-05-21 ENCOUNTER — Encounter: Payer: Self-pay | Admitting: Family Medicine

## 2022-05-26 NOTE — Telephone Encounter (Signed)
Letter written and in chart - will need to be printed and will need my signature.

## 2022-05-28 NOTE — Telephone Encounter (Addendum)
Placed letter at front office.

## 2022-05-28 NOTE — Telephone Encounter (Signed)
Printed and in Lisa's box.  

## 2022-05-30 ENCOUNTER — Encounter: Payer: Self-pay | Admitting: Family Medicine

## 2022-05-30 DIAGNOSIS — K5790 Diverticulosis of intestine, part unspecified, without perforation or abscess without bleeding: Secondary | ICD-10-CM | POA: Insufficient documentation

## 2022-07-02 ENCOUNTER — Encounter: Payer: Self-pay | Admitting: *Deleted

## 2022-07-30 ENCOUNTER — Ambulatory Visit: Payer: BC Managed Care – PPO | Admitting: Family Medicine

## 2022-07-30 ENCOUNTER — Encounter: Payer: Self-pay | Admitting: Family Medicine

## 2022-07-30 VITALS — BP 136/82 | HR 68 | Temp 98.0°F | Ht 67.5 in | Wt 176.2 lb

## 2022-07-30 DIAGNOSIS — N200 Calculus of kidney: Secondary | ICD-10-CM

## 2022-07-30 DIAGNOSIS — R7989 Other specified abnormal findings of blood chemistry: Secondary | ICD-10-CM | POA: Diagnosis not present

## 2022-07-30 DIAGNOSIS — I159 Secondary hypertension, unspecified: Secondary | ICD-10-CM | POA: Diagnosis not present

## 2022-07-30 DIAGNOSIS — N1831 Chronic kidney disease, stage 3a: Secondary | ICD-10-CM

## 2022-07-30 NOTE — Assessment & Plan Note (Signed)
Recent CT showed multiple kidney stones - but not symptomatic.

## 2022-07-30 NOTE — Assessment & Plan Note (Signed)
Chronic, stable period on current regimen - continue 4 drug regimen.

## 2022-07-30 NOTE — Patient Instructions (Addendum)
Call Dr Elwyn Lade office to follow up on referrals to blood doctor (hematology) and hormone doctor (endocrinology).  Blood pressures are doing better - continue current medicines.  Return after 01/12/2023 for physical, sooner if able (insurance allows it).

## 2022-07-30 NOTE — Assessment & Plan Note (Signed)
Appreciate renal care.  Was referred to endo and heme months ago, hasn't heard about appt - asked him to contact Dr Elwyn Lade office for update on referral status.

## 2022-07-30 NOTE — Assessment & Plan Note (Signed)
With recent elevated cortisol level. Recent CT done at [facility] didn't show adrenal adenoma.

## 2022-07-30 NOTE — Progress Notes (Signed)
Patient ID: Shawn Vaughn, male    DOB: 02-12-1971, 51 y.o.   MRN: [id number]  This visit was conducted in person.  BP 136/82   Pulse 68   Temp 98 F (36.7 C) (Temporal)   Ht 5' 7.5" (1.715 m)   Wt 176 lb 4 oz (79.9 kg)   SpO2 98%   BMI 27.20 kg/m    CC: 3 mo f/u visit  Subjective:   HPI: Shawn Vaughn is a 51 y.o. male presenting on 07/30/2022 for Follow-up (Here for 3 mo f/u.)   See prior note for details.  HTN - continues lotrel 10/20,g hctz '25mg'$  daily, toprol XL '100mg'$  daily. Home readings overall well controlled - nadir 117/70s. No HA, vision changes, CP/tightness, SOB, leg swelling. No low BP symptoms. Notes some left foot cramping at night time.   CKD stage 3 with high aldo/renin ratio, low renin - saw Dr Holley Raring with workup including renal artery duplex, renal ultrasound, further labs (SPEP/UPEP/ANA), found elevated cortisol with elevated serum protein levels with M spike in gamma globulin region - referred to heme and endo.  CT scan completed 05/2022 [facility] (in [place]) - overall no adrenal adenomas, but there were small kidney stones, gallstone x1 and diverticulosis.      Relevant past medical, surgical, family and social history reviewed and updated as indicated. Interim medical history since our last visit reviewed. Allergies and medications reviewed and updated. Outpatient Medications Prior to Visit  Medication Sig Dispense Refill   amLODipine-benazepril (LOTREL) 10-20 MG capsule Take 1 capsule by mouth daily. 90 capsule 4   Cholecalciferol (VITAMIN D3) 25 MCG (1000 UT) CAPS Take 1 capsule (1,000 Units total) by mouth daily. 30 capsule    hydrochlorothiazide (HYDRODIURIL) 25 MG tablet Take 25 mg by mouth daily.     metoprolol succinate (TOPROL-XL) 100 MG 24 hr tablet Take 1 tablet (100 mg total) by mouth daily. Take with or immediately following a meal. 90 tablet 4   No facility-administered medications prior to visit.     Per HPI unless  specifically indicated in ROS section below Review of Systems  Objective:  BP 136/82   Pulse 68   Temp 98 F (36.7 C) (Temporal)   Ht 5' 7.5" (1.715 m)   Wt 176 lb 4 oz (79.9 kg)   SpO2 98%   BMI 27.20 kg/m   Wt Readings from Last 3 Encounters:  07/30/22 176 lb 4 oz (79.9 kg)  05/11/22 174 lb 4 oz (79 kg)  01/12/22 180 lb 6 oz (81.8 kg)      Physical Exam Vitals and nursing note reviewed.  Constitutional:      Appearance: Normal appearance. He is not ill-appearing.  HENT:     Mouth/Throat:     Mouth: Mucous membranes are moist.     Pharynx: Oropharynx is clear. No oropharyngeal exudate or posterior oropharyngeal erythema.  Eyes:     Extraocular Movements: Extraocular movements intact.     Pupils: Pupils are equal, round, and reactive to light.  Cardiovascular:     Rate and Rhythm: Normal rate and regular rhythm.     Pulses: Normal pulses.     Heart sounds: Normal heart sounds. No murmur heard. Pulmonary:     Effort: Pulmonary effort is normal. No respiratory distress.     Breath sounds: Normal breath sounds. No wheezing, rhonchi or rales.  Musculoskeletal:     Right lower leg: No edema.     Left lower leg: No edema.  Skin:    General: Skin is warm and dry.     Findings: No rash.  Neurological:     Mental Status: He is alert.  Psychiatric:        Mood and Affect: Mood normal.        Behavior: Behavior normal.       Results for orders placed or performed in visit on 05/04/22  Lipid panel  Result Value Ref Range   Cholesterol 114 0 - 200 mg/dL   Triglycerides 132.0 0.0 - 149.0 mg/dL   HDL 38.60 (L) >39.00 mg/dL   VLDL 26.4 0.0 - 40.0 mg/dL   LDL Cholesterol 49 0 - 99 mg/dL   Total CHOL/HDL Ratio 3    NonHDL 75.64   Renal function panel  Result Value Ref Range   Sodium 139 135 - 145 mEq/L   Potassium 3.6 3.5 - 5.1 mEq/L   Chloride 99 96 - 112 mEq/L   CO2 31 19 - 32 mEq/L   Albumin 4.1 3.5 - 5.2 g/dL   BUN 24 (H) 6 - 23 mg/dL   Creatinine, Ser 1.45 0.40  - 1.50 mg/dL   Glucose, Bld 93 70 - 99 mg/dL   Phosphorus 4.0 2.3 - 4.6 mg/dL   GFR 56.59 (L) >60.00 mL/min   Calcium 9.3 8.4 - 10.5 mg/dL    Assessment & Plan:   Problem List Items Addressed This Visit     Hypertension, secondary - Primary    Chronic, stable period on current regimen - continue 4 drug regimen.       CKD (chronic kidney disease) stage 3, GFR 30-59 ml/min (HCC)    Appreciate renal care.  Was referred to endo and heme months ago, hasn't heard about appt - asked him to contact Dr Elwyn Lade office for update on referral status.       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 Recent CT done at [facility] didn't show adrenal adenoma.       Elevated cortisol level   Kidney stones    Recent CT showed multiple kidney stones - but not symptomatic.         No orders of the defined types were placed in this encounter.  No orders of the defined types were placed in this encounter.   Patient Instructions  Call Dr Elwyn Lade office to follow up on referrals to blood doctor (hematology) and hormone doctor (endocrinology).  Blood pressures are doing better - continue current medicines.  Return after 01/12/2023 for physical, sooner if able (insurance allows it).   Follow up plan: Return in about 6 months (around 01/28/2023), or if symptoms worsen or fail to improve, for annual exam, prior fasting for blood work.  Ria Bush, MD

## 2022-08-15 ENCOUNTER — Ambulatory Visit: Payer: BC Managed Care – PPO | Admitting: Family Medicine

## 2022-08-28 ENCOUNTER — Ambulatory Visit: Payer: BC Managed Care – PPO | Admitting: Family Medicine

## 2022-09-17 ENCOUNTER — Encounter (INDEPENDENT_AMBULATORY_CARE_PROVIDER_SITE_OTHER): Payer: Self-pay

## 2022-11-05 ENCOUNTER — Other Ambulatory Visit: Payer: BC Managed Care – PPO

## 2022-11-13 ENCOUNTER — Encounter: Payer: BC Managed Care – PPO | Admitting: Family Medicine

## 2022-12-14 ENCOUNTER — Telehealth: Payer: Self-pay | Admitting: *Deleted

## 2022-12-14 NOTE — Telephone Encounter (Signed)
Nurse placed call to patient to review appointment details for upcoming new patient consultation visit. Confirmed appointment details with patient. All questions regarding reason for visit and flow through clinic answered.

## 2022-12-17 ENCOUNTER — Inpatient Hospital Stay: Payer: BC Managed Care – PPO

## 2022-12-17 ENCOUNTER — Encounter: Payer: Self-pay | Admitting: Internal Medicine

## 2022-12-17 ENCOUNTER — Inpatient Hospital Stay: Payer: BC Managed Care – PPO | Attending: Internal Medicine | Admitting: Internal Medicine

## 2022-12-17 VITALS — BP 154/98 | HR 87 | Temp 97.8°F | Resp 20 | Wt 183.3 lb

## 2022-12-17 DIAGNOSIS — D472 Monoclonal gammopathy: Secondary | ICD-10-CM | POA: Insufficient documentation

## 2022-12-17 DIAGNOSIS — Z841 Family history of disorders of kidney and ureter: Secondary | ICD-10-CM | POA: Insufficient documentation

## 2022-12-17 DIAGNOSIS — Z79899 Other long term (current) drug therapy: Secondary | ICD-10-CM | POA: Diagnosis not present

## 2022-12-17 DIAGNOSIS — E781 Pure hyperglyceridemia: Secondary | ICD-10-CM | POA: Insufficient documentation

## 2022-12-17 DIAGNOSIS — Z8042 Family history of malignant neoplasm of prostate: Secondary | ICD-10-CM | POA: Insufficient documentation

## 2022-12-17 DIAGNOSIS — I129 Hypertensive chronic kidney disease with stage 1 through stage 4 chronic kidney disease, or unspecified chronic kidney disease: Secondary | ICD-10-CM | POA: Insufficient documentation

## 2022-12-17 DIAGNOSIS — Z862 Personal history of diseases of the blood and blood-forming organs and certain disorders involving the immune mechanism: Secondary | ICD-10-CM | POA: Insufficient documentation

## 2022-12-17 DIAGNOSIS — Z886 Allergy status to analgesic agent status: Secondary | ICD-10-CM | POA: Insufficient documentation

## 2022-12-17 DIAGNOSIS — Z87442 Personal history of urinary calculi: Secondary | ICD-10-CM | POA: Diagnosis not present

## 2022-12-17 DIAGNOSIS — N182 Chronic kidney disease, stage 2 (mild): Secondary | ICD-10-CM | POA: Insufficient documentation

## 2022-12-17 DIAGNOSIS — R947 Abnormal results of other endocrine function studies: Secondary | ICD-10-CM | POA: Diagnosis not present

## 2022-12-17 LAB — CBC WITH DIFFERENTIAL/PLATELET
Abs Immature Granulocytes: 0.01 10*3/uL (ref 0.00–0.07)
Basophils Absolute: 0 10*3/uL (ref 0.0–0.1)
Basophils Relative: 1 %
Eosinophils Absolute: 0.1 10*3/uL (ref 0.0–0.5)
Eosinophils Relative: 2 %
HCT: 48.2 % (ref 39.0–52.0)
Hemoglobin: 17 g/dL (ref 13.0–17.0)
Immature Granulocytes: 0 %
Lymphocytes Relative: 32 %
Lymphs Abs: 1.5 10*3/uL (ref 0.7–4.0)
MCH: 30.4 pg (ref 26.0–34.0)
MCHC: 35.3 g/dL (ref 30.0–36.0)
MCV: 86.1 fL (ref 80.0–100.0)
Monocytes Absolute: 0.4 10*3/uL (ref 0.1–1.0)
Monocytes Relative: 8 %
Neutro Abs: 2.6 10*3/uL (ref 1.7–7.7)
Neutrophils Relative %: 57 %
Platelets: 256 10*3/uL (ref 150–400)
RBC: 5.6 MIL/uL (ref 4.22–5.81)
RDW: 13.5 % (ref 11.5–15.5)
WBC: 4.6 10*3/uL (ref 4.0–10.5)
nRBC: 0 % (ref 0.0–0.2)

## 2022-12-17 LAB — COMPREHENSIVE METABOLIC PANEL
ALT: 20 U/L (ref 0–44)
AST: 23 U/L (ref 15–41)
Albumin: 4.4 g/dL (ref 3.5–5.0)
Alkaline Phosphatase: 59 U/L (ref 38–126)
Anion gap: 10 (ref 5–15)
BUN: 19 mg/dL (ref 6–20)
CO2: 28 mmol/L (ref 22–32)
Calcium: 9.3 mg/dL (ref 8.9–10.3)
Chloride: 97 mmol/L — ABNORMAL LOW (ref 98–111)
Creatinine, Ser: 1.29 mg/dL — ABNORMAL HIGH (ref 0.61–1.24)
GFR, Estimated: >60 mL/min (ref >60)
Glucose, Bld: 103 mg/dL — ABNORMAL HIGH (ref 70–99)
Potassium: 3.8 mmol/L (ref 3.5–5.1)
Sodium: 135 mmol/L (ref 135–145)
Total Bilirubin: 0.8 mg/dL (ref 0.3–1.2)
Total Protein: 9.2 g/dL — ABNORMAL HIGH (ref 6.5–8.1)

## 2022-12-17 NOTE — Progress Notes (Signed)
Shawn Vaughn  [phone] [phone]  ID: Shawn Vaughn OB: [birth year]  MR#: [id number]  XAJ#:[id number]  Patient Care Team: Ria Bush, MD as PCP - General (Family Medicine)  REFERRING PROVIDER: Dr. Holley Raring  REASON FOR REFERRAL: History of MGUS  HPI: Shawn Vaughn is a 52 y.o. male with pmh of kidney stones, MGUS, hypertension, hyperlipidemia was referred to hematology for prior history of MGUS.  Patient reports that he was found to have kidney disease sometime in July 2023.  There is reported history of MGUS however I was not able to find any blood work.  He has history of hypertension and is on hydrochlorothiazide, metoprolol and Lotrel.  He reports feeling well.  Denies any bone pain. Patient denies fever, chills, nausea, vomiting, shortness of breath, cough, abdominal pain, bleeding, bowel or bladder issues. Energy level is good.  Appetite is good.  Denies any weight loss.   REVIEW OF SYSTEMS:   ROS  As per HPI. Otherwise, a complete review of systems is negative.  PAST MEDICAL HISTORY: Past Medical History:  Diagnosis Date   History of chicken pox    History of pneumonia 11/19/2000   staph   Hypertension    Hypertriglyceridemia    Lyme disease 05/20/2015   Nephrolithiasis 07/21/2015   with R hydronephrosis that resolved   Shingles 04/2021   L flank   Solitary pulmonary nodule on lung CT 09/21/2015   24m RLL, no f/u needed     PAST SURGICAL HISTORY: Past Surgical History:  Procedure Laterality Date   [place]  2002   WNL per paper chart, EF >55%    FAMILY HISTORY: Family History  Problem Relation Age of Onset   Hypertension Mother    Hyperlipidemia Neg Hx    Diabetes Neg Hx    Coronary artery disease Neg Hx    Stroke Neg Hx    Cancer Maternal Grandfather 70       prostate   Prostate cancer Maternal Uncle    Nephrolithiasis Paternal Uncle     HEALTH MAINTENANCE: Social History   Tobacco  Use   Smoking status: Never   Smokeless tobacco: Never  Substance Use Topics   Alcohol use: No   Drug use: No     Allergies  Allergen Reactions   Ibuprofen     Blood in stool    Current Outpatient Medications  Medication Sig Dispense Refill   amLODipine-benazepril (LOTREL) 10-20 MG capsule Take 1 capsule by mouth daily. 90 capsule 4   Cholecalciferol (VITAMIN D3) 25 MCG (1000 UT) CAPS Take 1 capsule (1,000 Units total) by mouth daily. 30 capsule    hydrochlorothiazide (HYDRODIURIL) 25 MG tablet Take 25 mg by mouth daily.     metoprolol succinate (TOPROL-XL) 100 MG 24 hr tablet Take 1 tablet (100 mg total) by mouth daily. Take with or immediately following a meal. 90 tablet 4   No current facility-administered medications for this visit.    OBJECTIVE: Vitals:   12/17/22 1100  BP: (!) 154/98  Pulse: 87  Resp: 20  Temp: 97.8 F (36.6 C)  SpO2: 100%     Body mass index is 28.29 kg/m.      General: Well-developed, well-nourished, no acute distress. Eyes: Pink conjunctiva, anicteric sclera. HEENT: Normocephalic, moist mucous membranes, clear oropharnyx. Lungs: Clear to auscultation bilaterally. Heart: Regular rate and rhythm. No rubs, murmurs, or gallops. Abdomen: Soft, nontender, nondistended. No organomegaly noted, normoactive bowel sounds. Musculoskeletal: No edema, cyanosis, or clubbing. Neuro: Alert, answering all  questions appropriately. Cranial nerves grossly intact. Skin: No rashes or petechiae noted. Psych: Normal affect. Lymphatics: No cervical, calvicular, axillary or inguinal LAD.   LAB RESULTS:  Lab Results  Component Value Date   NA 139 05/04/2022   K 3.6 05/04/2022   CL 99 05/04/2022   CO2 31 05/04/2022   GLUCOSE 93 05/04/2022   BUN 24 (H) 05/04/2022   CREATININE 1.45 05/04/2022   CALCIUM 9.3 05/04/2022   PROT 8.1 01/05/2022   ALBUMIN 4.1 05/04/2022   AST 24 01/05/2022   ALT 22 01/05/2022   ALKPHOS 38 (L) 01/05/2022   BILITOT 0.8 01/05/2022    GFRNONAA 46 (L) 07/20/2015   GFRAA 53 (L) 07/20/2015    Lab Results  Component Value Date   WBC 4.7 01/05/2022   NEUTROABS 1.9 01/05/2022   HGB 14.4 01/05/2022   HCT 42.5 01/05/2022   MCV 88.4 01/05/2022   PLT 299.0 01/05/2022    No results found for: "TIBC", "FERRITIN", "IRONPCTSAT"   STUDIES: No results found.  ASSESSMENT AND PLAN:   Shawn Vaughn is a 52 y.o. male with pmh of kidney stones, MGUS, hypertension, hyperlipidemia was referred to hematology for prior history of MGUS.  # History of MGUS -No prior lab results available. -I will obtain labs as below which will help me obtain the baseline M spike and risk stratification.  For now I have scheduled the patient to be seen in 1 year however if there is anything on the blood work that needs to be addressed sooner we will have the patient rescheduled.  # CKD stage II -Follows with Dr. Holley Raring.  No albuminuria noted.  # Hypertension -On HCTZ, metoprolol, Lotrel  # Elevated aldosterone renin ratio -Not seen endocrinologist.  Orders Placed This Encounter  Procedures   CBC with Differential   Comprehensive metabolic panel   Kappa/lambda light chains   Protein Electrophoresis, Urine Rflx.   Immunofixation, urine   Multiple Myeloma Panel (SPEP&IFE w/QIG)   RTC in 1 year for MD visit, labs.  Patient expressed understanding and was in agreement with this plan. He also understands that He can call clinic at any time with any questions, concerns, or complaints.   I spent a total of 45 minutes reviewing chart data, face-to-face evaluation with the patient, counseling and coordination of care as detailed above.  Jane Canary, MD   12/17/2022 11:19 AM

## 2022-12-18 LAB — KAPPA/LAMBDA LIGHT CHAINS
Kappa free light chain: 22.1 mg/L — ABNORMAL HIGH (ref 3.3–19.4)
Kappa, lambda light chain ratio: 0.76 (ref 0.26–1.65)
Lambda free light chains: 28.9 mg/L — ABNORMAL HIGH (ref 5.7–26.3)

## 2022-12-18 LAB — IMMUNOFIXATION, URINE

## 2022-12-20 LAB — MULTIPLE MYELOMA PANEL, SERUM
Albumin SerPl Elph-Mcnc: 4.3 g/dL (ref 2.9–4.4)
Albumin/Glob SerPl: 1.1 (ref 0.7–1.7)
Alpha 1: 0.2 g/dL (ref 0.0–0.4)
Alpha2 Glob SerPl Elph-Mcnc: 0.5 g/dL (ref 0.4–1.0)
B-Globulin SerPl Elph-Mcnc: 1.1 g/dL (ref 0.7–1.3)
Gamma Glob SerPl Elph-Mcnc: 2.5 g/dL — ABNORMAL HIGH (ref 0.4–1.8)
Globulin, Total: 4.3 g/dL — ABNORMAL HIGH (ref 2.2–3.9)
IgA: 2237 mg/dL — ABNORMAL HIGH (ref 90–386)
IgG (Immunoglobin G), Serum: 1166 mg/dL (ref 603–1613)
IgM (Immunoglobulin M), Srm: 103 mg/dL (ref 20–172)
M Protein SerPl Elph-Mcnc: 1.5 g/dL — ABNORMAL HIGH
Total Protein ELP: 8.6 g/dL — ABNORMAL HIGH (ref 6.0–8.5)

## 2023-01-01 ENCOUNTER — Other Ambulatory Visit: Payer: Self-pay | Admitting: *Deleted

## 2023-01-01 ENCOUNTER — Telehealth: Payer: Self-pay | Admitting: *Deleted

## 2023-01-01 DIAGNOSIS — D472 Monoclonal gammopathy: Secondary | ICD-10-CM

## 2023-01-01 NOTE — Telephone Encounter (Signed)
Results from myeloma panel received through inbasket, reviewed by on call provider Dr. Grayland Ormond. Dr. Grayland Ormond recommends lab only visit in 3 months followed by MD visit 1 week later with Dr. Darrall Dears to discuss. Call placed to patient to discuss plan. Patient agreeable. Scheduling message sent.

## 2023-01-26 ENCOUNTER — Other Ambulatory Visit: Payer: Self-pay | Admitting: Family Medicine

## 2023-01-26 DIAGNOSIS — D472 Monoclonal gammopathy: Secondary | ICD-10-CM

## 2023-01-26 DIAGNOSIS — R7989 Other specified abnormal findings of blood chemistry: Secondary | ICD-10-CM

## 2023-01-26 DIAGNOSIS — E559 Vitamin D deficiency, unspecified: Secondary | ICD-10-CM

## 2023-01-26 DIAGNOSIS — E781 Pure hyperglyceridemia: Secondary | ICD-10-CM

## 2023-01-26 DIAGNOSIS — Z125 Encounter for screening for malignant neoplasm of prostate: Secondary | ICD-10-CM

## 2023-01-26 DIAGNOSIS — N1831 Chronic kidney disease, stage 3a: Secondary | ICD-10-CM

## 2023-01-26 DIAGNOSIS — I159 Secondary hypertension, unspecified: Secondary | ICD-10-CM

## 2023-01-27 ENCOUNTER — Other Ambulatory Visit: Payer: Self-pay | Admitting: Family Medicine

## 2023-01-28 ENCOUNTER — Other Ambulatory Visit (INDEPENDENT_AMBULATORY_CARE_PROVIDER_SITE_OTHER): Payer: BC Managed Care – PPO

## 2023-01-28 DIAGNOSIS — E781 Pure hyperglyceridemia: Secondary | ICD-10-CM | POA: Diagnosis not present

## 2023-01-28 DIAGNOSIS — N1831 Chronic kidney disease, stage 3a: Secondary | ICD-10-CM

## 2023-01-28 DIAGNOSIS — R7989 Other specified abnormal findings of blood chemistry: Secondary | ICD-10-CM

## 2023-01-28 DIAGNOSIS — Z125 Encounter for screening for malignant neoplasm of prostate: Secondary | ICD-10-CM

## 2023-01-28 DIAGNOSIS — E559 Vitamin D deficiency, unspecified: Secondary | ICD-10-CM

## 2023-01-28 LAB — COMPREHENSIVE METABOLIC PANEL
ALT: 19 U/L (ref 0–53)
AST: 20 U/L (ref 0–37)
Albumin: 3.9 g/dL (ref 3.5–5.2)
Alkaline Phosphatase: 52 U/L (ref 39–117)
BUN: 24 mg/dL — ABNORMAL HIGH (ref 6–23)
CO2: 29 mEq/L (ref 19–32)
Calcium: 9.8 mg/dL (ref 8.4–10.5)
Chloride: 100 mEq/L (ref 96–112)
Creatinine, Ser: 1.33 mg/dL (ref 0.40–1.50)
GFR: 62.45 mL/min (ref >60.00)
Glucose, Bld: 98 mg/dL (ref 70–99)
Potassium: 3.9 mEq/L (ref 3.5–5.1)
Sodium: 140 mEq/L (ref 135–145)
Total Bilirubin: 0.4 mg/dL (ref 0.2–1.2)
Total Protein: 7.9 g/dL (ref 6.0–8.3)

## 2023-01-28 LAB — CBC WITH DIFFERENTIAL/PLATELET
Basophils Absolute: 0 10*3/uL (ref 0.0–0.1)
Basophils Relative: 0.6 % (ref 0.0–3.0)
Eosinophils Absolute: 0.2 10*3/uL (ref 0.0–0.7)
Eosinophils Relative: 3.7 % (ref 0.0–5.0)
HCT: 47.8 % (ref 39.0–52.0)
Hemoglobin: 16.8 g/dL (ref 13.0–17.0)
Lymphocytes Relative: 41.4 % (ref 12.0–46.0)
Lymphs Abs: 2.3 10*3/uL (ref 0.7–4.0)
MCHC: 35.2 g/dL (ref 30.0–36.0)
MCV: 89.4 fl (ref 78.0–100.0)
Monocytes Absolute: 0.5 10*3/uL (ref 0.1–1.0)
Monocytes Relative: 8.8 % (ref 3.0–12.0)
Neutro Abs: 2.6 10*3/uL (ref 1.4–7.7)
Neutrophils Relative %: 45.5 % (ref 43.0–77.0)
Platelets: 265 10*3/uL (ref 150.0–400.0)
RBC: 5.35 Mil/uL (ref 4.22–5.81)
RDW: 13.7 % (ref 11.5–15.5)
WBC: 5.6 10*3/uL (ref 4.0–10.5)

## 2023-01-28 LAB — MICROALBUMIN / CREATININE URINE RATIO
Creatinine,U: 222.8 mg/dL
Microalb Creat Ratio: 0.3 mg/g (ref 0.0–30.0)
Microalb, Ur: 0.7 mg/dL (ref 0.0–1.9)

## 2023-01-28 LAB — VITAMIN D 25 HYDROXY (VIT D DEFICIENCY, FRACTURES): VITD: 24.03 ng/mL — ABNORMAL LOW (ref 30.00–100.00)

## 2023-01-28 LAB — LIPID PANEL
Cholesterol: 130 mg/dL (ref 0–200)
HDL: 33.4 mg/dL — ABNORMAL LOW (ref >39.00)
NonHDL: 96.83
Total CHOL/HDL Ratio: 4
Triglycerides: 314 mg/dL — ABNORMAL HIGH (ref 0.0–149.0)
VLDL: 62.8 mg/dL — ABNORMAL HIGH (ref 0.0–40.0)

## 2023-01-28 LAB — PHOSPHORUS: Phosphorus: 3.8 mg/dL (ref 2.3–4.6)

## 2023-01-28 LAB — PSA: PSA: 0.75 ng/mL (ref 0.10–4.00)

## 2023-01-28 LAB — LDL CHOLESTEROL, DIRECT: Direct LDL: 43 mg/dL

## 2023-01-29 NOTE — Telephone Encounter (Signed)
Rx d/c, per Dr. Darnell Level. (See 05/04/22 Labs, Results Notes and 05/11/22 OV notes.)  Request denied.

## 2023-02-06 ENCOUNTER — Ambulatory Visit (INDEPENDENT_AMBULATORY_CARE_PROVIDER_SITE_OTHER): Payer: BC Managed Care – PPO | Admitting: Family Medicine

## 2023-02-06 ENCOUNTER — Encounter: Payer: Self-pay | Admitting: Family Medicine

## 2023-02-06 VITALS — BP 144/96 | HR 99 | Temp 97.5°F | Ht 67.5 in | Wt 178.0 lb

## 2023-02-06 DIAGNOSIS — I159 Secondary hypertension, unspecified: Secondary | ICD-10-CM | POA: Diagnosis not present

## 2023-02-06 DIAGNOSIS — E781 Pure hyperglyceridemia: Secondary | ICD-10-CM

## 2023-02-06 DIAGNOSIS — R7989 Other specified abnormal findings of blood chemistry: Secondary | ICD-10-CM

## 2023-02-06 DIAGNOSIS — N182 Chronic kidney disease, stage 2 (mild): Secondary | ICD-10-CM | POA: Diagnosis not present

## 2023-02-06 DIAGNOSIS — D472 Monoclonal gammopathy: Secondary | ICD-10-CM

## 2023-02-06 DIAGNOSIS — Z0001 Encounter for general adult medical examination with abnormal findings: Secondary | ICD-10-CM

## 2023-02-06 DIAGNOSIS — E559 Vitamin D deficiency, unspecified: Secondary | ICD-10-CM

## 2023-02-06 MED ORDER — VITAMIN D3 50 MCG (2000 UT) PO CAPS: 2000.0000 [IU] | ORAL_CAPSULE | Freq: Every day | ORAL | Status: AC

## 2023-02-06 NOTE — Assessment & Plan Note (Signed)
Now seeing renal - with better CKD control, latest GFRs >60. Appreciate renal care.

## 2023-02-06 NOTE — Assessment & Plan Note (Signed)
Chronic, BP elevated today. However he notes home readings largely well controlled with systolics 0000000. Possible white coat hypertension component. I asked him to start monitoring blood pressures more closely at home and let me know if consistently elevated. He will also change Toprol XL to PM dosing.

## 2023-02-06 NOTE — Assessment & Plan Note (Signed)
Levels low on 1000 IU daily - will increase to 2000 IU daily.

## 2023-02-06 NOTE — Assessment & Plan Note (Addendum)
Again elevated off fenofibrate. Reassess at f/u visit.  Fenofibrate stopped 2023 due to concern it was contributing to CKD.

## 2023-02-06 NOTE — Progress Notes (Signed)
Patient ID: Shawn Vaughn, male    DOB: 1971-10-16, 52 y.o.   MRN: [id number]  This visit was conducted in person.  BP (!) 144/96   Pulse 99   Temp (!) 97.5 F (36.4 C) (Temporal)   Ht 5' 7.5" (1.715 m)   Wt 178 lb (80.7 kg)   SpO2 97%   BMI 27.47 kg/m   Elevated BP on repeat testing.   CC: CPE Subjective:   HPI: Shawn Vaughn is a 52 y.o. male presenting on 02/06/2023 for Annual Exam   Established with nephrology Dr Holley Raring for CKD. 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 Has rpt onc labs and f/u planned 03/2023.  Pending endo evaluation [facility]) for hyperaldosteronism with elevated cortisol levels.   Denies weight gain, ecchymoses, dorsal fat pad, lethargy/depression, striae, edema, prox muscle weakness.   Home BP readings run well controlled to low - latest check 110/85. He notes in-office readings run high. ?white coat hypertension component.   Notes he easily worries, stays anxious.  No depressed mood, normal sleep and appetite. Notes weight fluctuations.   Preventative: Colon cancer screening - Cologuard negative 11/2019 Prostate cancer screening - does have fmhx prostate cancer maternal grandfather age 37s. Continue yearly PSA with intermittent DRE.  Lung cancer screening - not eligible  Flu shot yearly, declines this year [place] 06/2020, 07/2020, Moderna booster 06/2021, no bivalent Tdap 2014  Shingrix - discussed Seat belt use discussed.  Sunscreen use discussed. No changing moles on skin.  Sleep - averaging 8 hours/night Non smoker  Alcohol - none  Dentist q6 mo  Eye exam - yearly    Caffeine: occasional sodas   Lives with mom, dad but has his own place, 4 dogs   Occupation: [employer] driver Edu: [employer]   Activity: hunts with dogs 3x/wk, tries to walk 1 mi/day  Diet: good water, fruits/vegetables daily      Relevant past medical, surgical, family and social history reviewed and updated as  indicated. Interim medical history since our last visit reviewed. Allergies and medications reviewed and updated. Outpatient Medications Prior to Visit  Medication Sig Dispense Refill   amLODipine-benazepril (LOTREL) 10-20 MG capsule Take 1 capsule by mouth daily. 90 capsule 4   hydrochlorothiazide (HYDRODIURIL) 25 MG tablet Take 25 mg by mouth daily.     metoprolol succinate (TOPROL-XL) 100 MG 24 hr tablet Take 1 tablet (100 mg total) by mouth daily. Take with or immediately following a meal. 90 tablet 4   Cholecalciferol (VITAMIN D3) 25 MCG (1000 UT) CAPS Take 1 capsule (1,000 Units total) by mouth daily. 30 capsule    No facility-administered medications prior to visit.     Per HPI unless specifically indicated in ROS section below Review of Systems  Constitutional:  Negative for activity change, appetite change, chills, fatigue, fever and unexpected weight change.  HENT:  Negative for hearing loss.   Eyes:  Negative for visual disturbance.  Respiratory:  Negative for cough, chest tightness, shortness of breath and wheezing.   Cardiovascular:  Negative for chest pain, palpitations and leg swelling.  Gastrointestinal:  Negative for abdominal distention, abdominal pain, blood in stool, constipation, diarrhea, nausea and vomiting.  Genitourinary:  Negative for difficulty urinating and hematuria.  Musculoskeletal:  Negative for arthralgias, myalgias and neck pain.  Skin:  Negative for rash.  Neurological:  Negative for dizziness, seizures, syncope and headaches.  Hematological:  Negative for adenopathy. Does not bruise/bleed easily.  Psychiatric/Behavioral:  Negative  for dysphoric mood. The patient is not nervous/anxious.     Objective:  BP (!) 144/96   Pulse 99   Temp (!) 97.5 F (36.4 C) (Temporal)   Ht 5' 7.5" (1.715 m)   Wt 178 lb (80.7 kg)   SpO2 97%   BMI 27.47 kg/m   Wt Readings from Last 3 Encounters:  02/06/23 178 lb (80.7 kg)  12/17/22 183 lb 4.8 oz (83.1 kg)   07/30/22 176 lb 4 oz (79.9 kg)      Physical Exam Vitals and nursing note reviewed.  Constitutional:      General: He is not in acute distress.    Appearance: Normal appearance. He is well-developed. He is not ill-appearing.  HENT:     Head: Normocephalic and atraumatic.     Right Ear: Hearing, tympanic membrane, ear canal and external ear normal.     Left Ear: Hearing, tympanic membrane, ear canal and external ear normal.  Eyes:     General: No scleral icterus.    Extraocular Movements: Extraocular movements intact.     Conjunctiva/sclera: Conjunctivae normal.     Pupils: Pupils are equal, round, and reactive to light.  Neck:     Thyroid: No thyroid mass or thyromegaly.  Cardiovascular:     Rate and Rhythm: Normal rate and regular rhythm.     Pulses: Normal pulses.          Radial pulses are 2+ on the right side and 2+ on the left side.     Heart sounds: Normal heart sounds. No murmur heard. Pulmonary:     Effort: Pulmonary effort is normal. No respiratory distress.     Breath sounds: Normal breath sounds. No wheezing, rhonchi or rales.  Abdominal:     General: Bowel sounds are normal. There is no distension.     Palpations: Abdomen is soft. There is no mass.     Tenderness: There is no abdominal tenderness. There is no guarding or rebound.     Hernia: No hernia is present.  Musculoskeletal:        General: Normal range of motion.     Cervical back: Normal range of motion and neck supple.     Right lower leg: No edema.     Left lower leg: No edema.  Lymphadenopathy:     Cervical: No cervical adenopathy.  Skin:    General: Skin is warm and dry.     Findings: No rash.  Neurological:     General: No focal deficit present.     Mental Status: He is alert and oriented to person, place, and time.  Psychiatric:        Mood and Affect: Mood normal.        Behavior: Behavior normal.        Thought Content: Thought content normal.        Judgment: Judgment normal.        Results for orders placed or performed in visit on 01/28/23  Cortisol-am, blood  Result Value Ref Range   Cortisol - AM 26.7 (H) mcg/dL  PSA  Result Value Ref Range   PSA 0.75 0.10 - 4.00 ng/mL  CBC with Differential/Platelet  Result Value Ref Range   WBC 5.6 4.0 - 10.5 K/uL   RBC 5.35 4.22 - 5.81 Mil/uL   Hemoglobin 16.8 13.0 - 17.0 g/dL   HCT 47.8 39.0 - 52.0 %   MCV 89.4 78.0 - 100.0 fl   MCHC 35.2 30.0 - 36.0 g/dL  RDW 13.7 11.5 - 15.5 %   Platelets 265.0 150.0 - 400.0 K/uL   Neutrophils Relative % 45.5 43.0 - 77.0 %   Lymphocytes Relative 41.4 12.0 - 46.0 %   Monocytes Relative 8.8 3.0 - 12.0 %   Eosinophils Relative 3.7 0.0 - 5.0 %   Basophils Relative 0.6 0.0 - 3.0 %   Neutro Abs 2.6 1.4 - 7.7 K/uL   Lymphs Abs 2.3 0.7 - 4.0 K/uL   Monocytes Absolute 0.5 0.1 - 1.0 K/uL   Eosinophils Absolute 0.2 0.0 - 0.7 K/uL   Basophils Absolute 0.0 0.0 - 0.1 K/uL  Parathyroid hormone, intact (no Ca)  Result Value Ref Range   PTH 42 16 - 77 pg/mL  Microalbumin / creatinine urine ratio  Result Value Ref Range   Microalb, Ur 0.7 0.0 - 1.9 mg/dL   Creatinine,U 222.8 mg/dL   Microalb Creat Ratio 0.3 0.0 - 30.0 mg/g  VITAMIN D 25 Hydroxy (Vit-D Deficiency, Fractures)  Result Value Ref Range   VITD 24.03 (L) 30.00 - 100.00 ng/mL  Phosphorus  Result Value Ref Range   Phosphorus 3.8 2.3 - 4.6 mg/dL  Comprehensive metabolic panel  Result Value Ref Range   Sodium 140 135 - 145 mEq/L   Potassium 3.9 3.5 - 5.1 mEq/L   Chloride 100 96 - 112 mEq/L   CO2 29 19 - 32 mEq/L   Glucose, Bld 98 70 - 99 mg/dL   BUN 24 (H) 6 - 23 mg/dL   Creatinine, Ser 1.33 0.40 - 1.50 mg/dL   Total Bilirubin 0.4 0.2 - 1.2 mg/dL   Alkaline Phosphatase 52 39 - 117 U/L   AST 20 0 - 37 U/L   ALT 19 0 - 53 U/L   Total Protein 7.9 6.0 - 8.3 g/dL   Albumin 3.9 3.5 - 5.2 g/dL   GFR 62.45 >60.00 mL/min   Calcium 9.8 8.4 - 10.5 mg/dL  Lipid panel  Result Value Ref Range   Cholesterol 130 0 - 200 mg/dL    Triglycerides 314.0 (H) 0.0 - 149.0 mg/dL   HDL 33.40 (L) >39.00 mg/dL   VLDL 62.8 (H) 0.0 - 40.0 mg/dL   Total CHOL/HDL Ratio 4    NonHDL 96.83   LDL cholesterol, direct  Result Value Ref Range   Direct LDL 43.0 mg/dL      02/06/2023    8:36 AM 12/17/2022   11:39 AM 01/12/2022    7:37 AM 12/13/2020    2:06 PM 11/17/2019    8:29 AM  Depression screen PHQ 2/9  Decreased Interest 0 0 0 0 0  Down, Depressed, Hopeless 0 0 0 0 0  PHQ - 2 Score 0 0 0 0 0       02/06/2023    8:38 AM  GAD 7 : Generalized Anxiety Score  Nervous, Anxious, on Edge 0  Control/stop worrying 0  Worry too much - different things 1  Trouble relaxing 0  Restless 0  Easily annoyed or irritable 0  Afraid - awful might happen 1  Total GAD 7 Score 2  Anxiety Difficulty Not difficult at all   Assessment & Plan:   Problem List Items Addressed This Visit     Encounter for general adult medical examination with abnormal findings - Primary (Chronic)    Preventative protocols reviewed and updated unless pt declined. Discussed healthy diet and lifestyle.       Hypertension, secondary    Chronic, BP elevated today. However he notes home readings largely  well controlled with systolics 0000000. Possible white coat hypertension component. I asked him to start monitoring blood pressures more closely at home and let me know if consistently elevated. He will also change Toprol XL to PM dosing.       Hypertriglyceridemia    Again elevated off fenofibrate. Reassess at f/u visit.  Fenofibrate stopped 2023 due to concern it was contributing to CKD.       CKD (chronic kidney disease) stage 2, GFR 60-89 ml/min    Now seeing renal - with better CKD control, latest GFRs >60. Appreciate renal care.       Vitamin D deficiency    Levels low on 1000 IU daily - will increase to 2000 IU daily.       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  Pending endo eval.      Elevated cortisol level    Am  cortisol remains elevated. Denies cushing syndrome symptoms. Pending endo eval.      MGUS (monoclonal gammopathy of unknown significance)    Upcoming onc f/u scheduled 03/2023.         Meds ordered this encounter  Medications   Cholecalciferol (VITAMIN D3) 50 MCG (2000 UT) CAPS    Sig: Take 1 capsule (2,000 Units total) by mouth daily.    Dispense:  30 capsule    No orders of the defined types were placed in this encounter.   Patient Instructions  Let me know what you would like to do for colon cancer screening this year.  Think about shingles shots.  Triglyceride levels were elevated - work on healthy diet, increased fatty fish in diet (salmon, tuna, trout).  Return in 6 months for follow up visit, fasting.   Follow up plan: Return in about 6 months (around 08/09/2023) for follow up visit.  Ria Bush, MD

## 2023-02-06 NOTE — Assessment & Plan Note (Signed)
Upcoming onc f/u scheduled 03/2023.

## 2023-02-06 NOTE — Assessment & Plan Note (Signed)
Preventative protocols reviewed and updated unless pt declined. Discussed healthy diet and lifestyle.  

## 2023-02-06 NOTE — Patient Instructions (Addendum)
Let me know what you would like to do for colon cancer screening this year.  Think about shingles shots.  Triglyceride levels were elevated - work on healthy diet, increased fatty fish in diet (salmon, tuna, trout).  Return in 6 months for follow up visit, fasting.

## 2023-02-06 NOTE — Assessment & Plan Note (Addendum)
Pending aldo/renin levels.  Pending endo eval.

## 2023-02-06 NOTE — Assessment & Plan Note (Signed)
Am cortisol remains elevated. Denies cushing syndrome symptoms. Pending endo eval.

## 2023-02-10 LAB — ALDOSTERONE + RENIN ACTIVITY W/ RATIO
ALDO / PRA Ratio: 127.3 Ratio — ABNORMAL HIGH (ref 0.9–28.9)
Aldosterone: 14 ng/dL
Renin Activity: 0.11 ng/mL/h — ABNORMAL LOW (ref 0.25–5.82)

## 2023-02-10 LAB — CORTISOL-AM, BLOOD: Cortisol - AM: 26.7 ug/dL — ABNORMAL HIGH

## 2023-02-10 LAB — PARATHYROID HORMONE, INTACT (NO CA): PTH: 42 pg/mL (ref 16–77)

## 2023-02-12 ENCOUNTER — Other Ambulatory Visit: Payer: Self-pay | Admitting: Family Medicine

## 2023-02-12 DIAGNOSIS — I159 Secondary hypertension, unspecified: Secondary | ICD-10-CM

## 2023-02-14 ENCOUNTER — Other Ambulatory Visit: Payer: Self-pay | Admitting: Family Medicine

## 2023-03-19 ENCOUNTER — Other Ambulatory Visit: Payer: Self-pay | Admitting: Family Medicine

## 2023-03-19 DIAGNOSIS — I159 Secondary hypertension, unspecified: Secondary | ICD-10-CM

## 2023-03-19 NOTE — Telephone Encounter (Signed)
Pt called in to know status of RX refill 

## 2023-03-22 ENCOUNTER — Inpatient Hospital Stay: Payer: BC Managed Care – PPO | Attending: Internal Medicine

## 2023-03-22 DIAGNOSIS — D472 Monoclonal gammopathy: Secondary | ICD-10-CM | POA: Insufficient documentation

## 2023-03-22 DIAGNOSIS — Z841 Family history of disorders of kidney and ureter: Secondary | ICD-10-CM | POA: Diagnosis not present

## 2023-03-22 DIAGNOSIS — M79601 Pain in right arm: Secondary | ICD-10-CM | POA: Insufficient documentation

## 2023-03-22 DIAGNOSIS — M25511 Pain in right shoulder: Secondary | ICD-10-CM | POA: Diagnosis not present

## 2023-03-22 DIAGNOSIS — Z79899 Other long term (current) drug therapy: Secondary | ICD-10-CM | POA: Diagnosis not present

## 2023-03-22 DIAGNOSIS — N182 Chronic kidney disease, stage 2 (mild): Secondary | ICD-10-CM | POA: Insufficient documentation

## 2023-03-22 DIAGNOSIS — Z8042 Family history of malignant neoplasm of prostate: Secondary | ICD-10-CM | POA: Diagnosis not present

## 2023-03-22 DIAGNOSIS — Z886 Allergy status to analgesic agent status: Secondary | ICD-10-CM | POA: Diagnosis not present

## 2023-03-22 DIAGNOSIS — M546 Pain in thoracic spine: Secondary | ICD-10-CM | POA: Diagnosis not present

## 2023-03-22 DIAGNOSIS — E781 Pure hyperglyceridemia: Secondary | ICD-10-CM | POA: Diagnosis not present

## 2023-03-22 DIAGNOSIS — Z8249 Family history of ischemic heart disease and other diseases of the circulatory system: Secondary | ICD-10-CM | POA: Diagnosis not present

## 2023-03-22 DIAGNOSIS — Z87442 Personal history of urinary calculi: Secondary | ICD-10-CM | POA: Diagnosis not present

## 2023-03-22 DIAGNOSIS — I129 Hypertensive chronic kidney disease with stage 1 through stage 4 chronic kidney disease, or unspecified chronic kidney disease: Secondary | ICD-10-CM | POA: Diagnosis not present

## 2023-03-22 LAB — CBC WITH DIFFERENTIAL/PLATELET
Abs Immature Granulocytes: 0.02 10*3/uL (ref 0.00–0.07)
Basophils Absolute: 0 10*3/uL (ref 0.0–0.1)
Basophils Relative: 1 %
Eosinophils Absolute: 0.2 10*3/uL (ref 0.0–0.5)
Eosinophils Relative: 4 %
HCT: 48.9 % (ref 39.0–52.0)
Hemoglobin: 17.3 g/dL — ABNORMAL HIGH (ref 13.0–17.0)
Immature Granulocytes: 0 %
Lymphocytes Relative: 32 %
Lymphs Abs: 2.1 10*3/uL (ref 0.7–4.0)
MCH: 30.6 pg (ref 26.0–34.0)
MCHC: 35.4 g/dL (ref 30.0–36.0)
MCV: 86.4 fL (ref 80.0–100.0)
Monocytes Absolute: 0.4 10*3/uL (ref 0.1–1.0)
Monocytes Relative: 7 %
Neutro Abs: 3.7 10*3/uL (ref 1.7–7.7)
Neutrophils Relative %: 56 %
Platelets: 259 10*3/uL (ref 150–400)
RBC: 5.66 MIL/uL (ref 4.22–5.81)
RDW: 13.5 % (ref 11.5–15.5)
WBC: 6.5 10*3/uL (ref 4.0–10.5)
nRBC: 0 % (ref 0.0–0.2)

## 2023-03-22 LAB — COMPREHENSIVE METABOLIC PANEL
ALT: 26 U/L (ref 0–44)
AST: 29 U/L (ref 15–41)
Albumin: 4.3 g/dL (ref 3.5–5.0)
Alkaline Phosphatase: 63 U/L (ref 38–126)
Anion gap: 10 (ref 5–15)
BUN: 18 mg/dL (ref 6–20)
CO2: 28 mmol/L (ref 22–32)
Calcium: 9.4 mg/dL (ref 8.9–10.3)
Chloride: 99 mmol/L (ref 98–111)
Creatinine, Ser: 1.12 mg/dL (ref 0.61–1.24)
GFR, Estimated: >60 mL/min (ref >60)
Glucose, Bld: 109 mg/dL — ABNORMAL HIGH (ref 70–99)
Potassium: 3.7 mmol/L (ref 3.5–5.1)
Sodium: 137 mmol/L (ref 135–145)
Total Bilirubin: 0.7 mg/dL (ref 0.3–1.2)
Total Protein: 9.1 g/dL — ABNORMAL HIGH (ref 6.5–8.1)

## 2023-03-25 LAB — KAPPA/LAMBDA LIGHT CHAINS
Kappa free light chain: 20.4 mg/L — ABNORMAL HIGH (ref 3.3–19.4)
Kappa, lambda light chain ratio: 0.61 (ref 0.26–1.65)
Lambda free light chains: 33.4 mg/L — ABNORMAL HIGH (ref 5.7–26.3)

## 2023-03-25 NOTE — Progress Notes (Deleted)
Name: Shawn Vaughn  MRN/ DOB: [id number], May 13, 1971    Age/ Sex: 52 y.o., male    PCP: Eustaquio Boyden, MD   Reason for Endocrinology Evaluation: HTN     Date of Initial Endocrinology Evaluation: 03/25/2023     HPI: [name] is a 52 y.o. male with a past medical history of HTN, Lyme disease, nephrolithiasis, and dyslipidemia. The patient presented for initial endocrinology clinic visit on 03/25/2023 for consultative assistance with his HTN.   ***   In reviewing his chart, patient has been noted with elevated Aldo/PRA ratio and aldosterone level of 12 mg/DL, suppressed renin 4.09 NG/mL/H on 02/06/2022   At the time the patient was on antihypertensive medications to include amlodipine-benazepril, and Toprol-XL   Patient was evaluated by [facility] on 03/29/2022 for evaluation of CKD III a and refractory HTN.  HCTZ was added at the time  Repeat labs confirmed elevated Aldo/PRA ratio at 127.3 with aldosterone level of 14 NG/DL , suppressed renin 8.11 NG/mL/H ,  at the time he was noted with elevated a.m. cortisol at 26.7 mcg/DL (reference 9-14)   CT imaging of the abdomen 05/28/2022 showed normal adrenal glands    His last appointment with nephrology was on 03/21/2023 with improvement of his GFR.  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 Mother with HTN   HISTORY:  Past Medical History:  Past Medical History:  Diagnosis Date   History of chicken pox    History of pneumonia 11/19/2000   staph   Hypertension    Hypertriglyceridemia    Lyme disease 05/20/2015   Nephrolithiasis 07/21/2015   with R hydronephrosis that resolved   Shingles 04/2021   L flank   Solitary pulmonary nodule on lung CT 09/21/2015   4mm RLL, no f/u needed    Past Surgical History:  Past Surgical History:  Procedure Laterality Date   US ECHOCARDIOGRAPHY  2002   WNL per paper chart, EF >55%    Social History:  reports that he has never smoked.  He has never used smokeless tobacco. He reports that he does not drink alcohol and does not use drugs. Family History: family history includes Cancer (age of onset: 62) in his maternal grandfather; Hypertension in his mother; Nephrolithiasis in his paternal uncle; Prostate cancer in his maternal uncle.   HOME MEDICATIONS: Allergies as of 03/26/2023       Reactions   Ibuprofen    Blood in stool        Medication List        Accurate as of Mar 25, 2023  4:48 PM. If you have any questions, ask your nurse or doctor.          amLODipine-benazepril 10-20 MG capsule Commonly known as: LOTREL TAKE 1 CAPSULE BY MOUTH EVERY DAY   hydrochlorothiazide 25 MG tablet Commonly known as: HYDRODIURIL TAKE 1 TABLET BY MOUTH 1 TIME EACH DAY.   metoprolol succinate 100 MG 24 hr tablet Commonly known as: TOPROL-XL TAKE 1 TABLET BY MOUTH DAILY. TAKE WITH OR IMMEDIATELY FOLLOWING A MEAL.   Vitamin D3 50 MCG (2000 UT) capsule Take 1 capsule (2,000 Units total) by mouth daily.          REVIEW OF SYSTEMS: A comprehensive ROS was conducted with the patient and is negative except as per HPI and below:  ROS     OBJECTIVE:  VS: There were no vitals taken for this visit.   Wt Readings from Last  3 Encounters:  02/06/23 178 lb (80.7 kg)  12/17/22 183 lb 4.8 oz (83.1 kg)  07/30/22 176 lb 4 oz (79.9 kg)     EXAM: General: Pt appears well and is in NAD  Eyes: External eye exam normal without stare, lid lag or exophthalmos.  EOM intact.  PERRL.  Neck: General: Supple without adenopathy. Thyroid: Thyroid size normal.  No goiter or nodules appreciated. No thyroid bruit.  Lungs: Clear with good BS bilat with no rales, rhonchi, or wheezes  Heart: Auscultation: RRR.  Abdomen: Normoactive bowel sounds, soft, nontender, without masses or organomegaly palpable  Extremities:  BL LE: No pretibial edema normal ROM and strength.  Mental Status: Judgment, insight: Intact Orientation: Oriented to  time, place, and person Mood and affect: No depression, anxiety, or agitation     DATA REVIEWED: ***    Latest Reference Range & Units 03/22/23 07:56  Sodium 135 - 145 mmol/L 137  Potassium 3.5 - 5.1 mmol/L 3.7  Chloride 98 - 111 mmol/L 99  CO2 22 - 32 mmol/L 28  Glucose 70 - 99 mg/dL 604 (H)  BUN 6 - 20 mg/dL 18  Creatinine 5.40 - 9.81 mg/dL 1.91  Calcium 8.9 - 47.8 mg/dL 9.4  Anion gap 5 - 15  10  Alkaline Phosphatase 38 - 126 U/L 63  Albumin 3.5 - 5.0 g/dL 4.3  AST 15 - 41 U/L 29  ALT 0 - 44 U/L 26  Total Protein 6.5 - 8.1 g/dL 9.1 (H)  Total Bilirubin 0.3 - 1.2 mg/dL 0.7  GFR, Estimated >29 mL/min >60  (H): Data is abnormally high  ASSESSMENT/PLAN/RECOMMENDATIONS:   ***    Medications :  Signed electronically by: Lyndle Herrlich, MD  [facility] Endocrinology  [facility] [address], [address] [place], [place] [address] Phone: [phone] FAX: [phone]   CC: Eustaquio Boyden, MD [address] [place] [address] Phone: [phone] Fax: [phone]   Return to Endocrinology clinic as below: Future Appointments  Date Time Provider Department Center  03/26/2023  7:30 AM Beata Beason, Konrad Dolores, MD [facility] None  03/27/2023  8:30 AM Eustaquio Boyden, MD [facility]  03/29/2023 10:45 AM Michaelyn Barter, MD [facility] None  07/30/2023  8:00 AM Eustaquio Boyden, MD [facility]  12/19/2023  1:30 PM [facility] None  12/19/2023  1:45 PM Michaelyn Barter, MD [facility] None

## 2023-03-26 ENCOUNTER — Ambulatory Visit: Payer: BC Managed Care – PPO | Admitting: Internal Medicine

## 2023-03-27 ENCOUNTER — Encounter: Payer: Self-pay | Admitting: Family Medicine

## 2023-03-27 ENCOUNTER — Ambulatory Visit: Payer: BC Managed Care – PPO | Admitting: Family Medicine

## 2023-03-27 VITALS — BP 130/78 | HR 87 | Temp 97.1°F | Ht 67.5 in | Wt 178.0 lb

## 2023-03-27 DIAGNOSIS — D472 Monoclonal gammopathy: Secondary | ICD-10-CM

## 2023-03-27 DIAGNOSIS — S29012A Strain of muscle and tendon of back wall of thorax, initial encounter: Secondary | ICD-10-CM | POA: Diagnosis not present

## 2023-03-27 DIAGNOSIS — E781 Pure hyperglyceridemia: Secondary | ICD-10-CM

## 2023-03-27 DIAGNOSIS — N182 Chronic kidney disease, stage 2 (mild): Secondary | ICD-10-CM | POA: Diagnosis not present

## 2023-03-27 DIAGNOSIS — I159 Secondary hypertension, unspecified: Secondary | ICD-10-CM | POA: Diagnosis not present

## 2023-03-27 LAB — IMMUNOFIXATION, URINE

## 2023-03-27 MED ORDER — CYCLOBENZAPRINE HCL 10 MG PO TABS: 5.0000 mg | ORAL_TABLET | Freq: Two times a day (BID) | ORAL | 0 refills | Status: DC | PRN

## 2023-03-27 NOTE — Assessment & Plan Note (Addendum)
Latest GFR improved to 69 Appreciate renal care.

## 2023-03-27 NOTE — Assessment & Plan Note (Addendum)
Chronic, stable, controlled on current regimen. Update EKG today.  Missed endo appt yesterday (went to wrong location) - has rescheduled to 09/2023.  Discussed further CAD screening with coronary CT scan with calcium score - he will let me know if desires to proceed with this.

## 2023-03-27 NOTE — Assessment & Plan Note (Signed)
Discussed recently elevated triglycerides, he declines restarting fibrate at this time. Will recheck fasting lipid panel at 07/2023 f/u visit.

## 2023-03-27 NOTE — Progress Notes (Signed)
Ph: [phone]       Fax: [phone]   Patient ID: Shawn Vaughn, male    DOB: 25-Aug-1971, 52 y.o.   MRN: [id number]  This visit was conducted in person.  BP 130/78   Pulse 87   Temp (!) 97.1 F (36.2 C) (Temporal)   Ht 5' 7.5" (1.715 m)   Wt 178 lb (80.7 kg)   SpO2 98%   BMI 27.47 kg/m    CC: discuss R shoulder pain and EKG Subjective:   HPI: Shawn Vaughn is a 52 y.o. male presenting on 03/27/2023 for Shoulder Pain (C/o posterior R shoulder pain, radiating down into posterior upper arm. Started last wk. Denies injury. Also, requests EKG "just because". Denies any sxs. )   Established with nephrology Dr Cherylann Ratel for CKD.  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 Has rpt onc labs and f/u planned 03/2023.  Upcoming endocrinology appt for hyperaldosteronism with elevated cortisol levels.   Colon cancer screening - still considering options. Normally takes the month of November off.   1 wk h/o R shoulder and rhomboid pain with radiation to posterior upper arm. Denies inciting trauma/injury or fall. No new exercise routine. No heavy lifting besides work - occ heavy lifting. Treating with biofreeze with benefit. No numbness or weakness of arm. Throbbing ache. More noticeable with activity.   Continues lotrel, hctz, toprol XL for BP control.  Requests updated EKG in HTN hx.      Relevant past medical, surgical, family and social history reviewed and updated as indicated. Interim medical history since our last visit reviewed. Allergies and medications reviewed and updated. Outpatient Medications Prior to Visit  Medication Sig Dispense Refill   amLODipine-benazepril (LOTREL) 10-20 MG capsule TAKE 1 CAPSULE BY MOUTH EVERY DAY 90 capsule 4   Cholecalciferol (VITAMIN D3) 50 MCG (2000 UT) CAPS Take 1 capsule (2,000 Units total) by mouth daily. 30 capsule    hydrochlorothiazide (HYDRODIURIL) 25 MG tablet TAKE 1 TABLET BY MOUTH 1 TIME  EACH DAY. 90 tablet 3   metoprolol succinate (TOPROL-XL) 100 MG 24 hr tablet TAKE 1 TABLET BY MOUTH DAILY. TAKE WITH OR IMMEDIATELY FOLLOWING A MEAL. 90 tablet 4   No facility-administered medications prior to visit.     Per HPI unless specifically indicated in ROS section below Review of Systems  Objective:  BP 130/78   Pulse 87   Temp (!) 97.1 F (36.2 C) (Temporal)   Ht 5' 7.5" (1.715 m)   Wt 178 lb (80.7 kg)   SpO2 98%   BMI 27.47 kg/m   Wt Readings from Last 3 Encounters:  03/27/23 178 lb (80.7 kg)  02/06/23 178 lb (80.7 kg)  12/17/22 183 lb 4.8 oz (83.1 kg)      Physical Exam Vitals and nursing note reviewed.  Constitutional:      Appearance: Normal appearance. He is not ill-appearing.  HENT:     Mouth/Throat:     Comments: Wearing mask Neck:     Comments:  FROM cervical neck No midline cervical neck tenderness No paracervical mm discomfort Musculoskeletal:        General: Tenderness present. Normal range of motion.     Cervical back: Normal range of motion and neck supple. No rigidity or tenderness.     Comments:  No pain at trapezius mm Tight R rhomboids with reproducible tenderness to palpation >L L shoulder WNL  R shoulder exam: No deformity of shoulders on inspection. No pain with  palpation of shoulder landmarks. FROM in abduction and forward flexion. No pain or weakness with testing SITS in ext/int rotation. No pain with empty can sign. Neg Speed test. No impingement. No pain with rotation of humeral head in [facility] joint.   Neurological:     Mental Status: He is alert.       Results for orders placed or performed in visit on 03/22/23  Immunofixation, urine  Result Value Ref Range   Immunofixation, Urine Comment   Kappa/lambda light chains  Result Value Ref Range   Kappa free light chain 20.4 (H) 3.3 - 19.4 mg/L   Lambda free light chains 33.4 (H) 5.7 - 26.3 mg/L   Kappa, lambda light chain ratio 0.61 0.26 - 1.65  Comprehensive metabolic panel   Result Value Ref Range   Sodium 137 135 - 145 mmol/L   Potassium 3.7 3.5 - 5.1 mmol/L   Chloride 99 98 - 111 mmol/L   CO2 28 22 - 32 mmol/L   Glucose, Bld 109 (H) 70 - 99 mg/dL   BUN 18 6 - 20 mg/dL   Creatinine, Ser 5.40 0.61 - 1.24 mg/dL   Calcium 9.4 8.9 - 98.1 mg/dL   Total Protein 9.1 (H) 6.5 - 8.1 g/dL   Albumin 4.3 3.5 - 5.0 g/dL   AST 29 15 - 41 U/L   ALT 26 0 - 44 U/L   Alkaline Phosphatase 63 38 - 126 U/L   Total Bilirubin 0.7 0.3 - 1.2 mg/dL   GFR, Estimated >19 >14 mL/min   Anion gap 10 5 - 15  CBC with Differential/Platelet  Result Value Ref Range   WBC 6.5 4.0 - 10.5 K/uL   RBC 5.66 4.22 - 5.81 MIL/uL   Hemoglobin 17.3 (H) 13.0 - 17.0 g/dL   HCT 78.2 95.6 - 21.3 %   MCV 86.4 80.0 - 100.0 fL   MCH 30.6 26.0 - 34.0 pg   MCHC 35.4 30.0 - 36.0 g/dL   RDW 08.6 57.8 - 46.9 %   Platelets 259 150 - 400 K/uL   nRBC 0.0 0.0 - 0.2 %   Neutrophils Relative % 56 %   Neutro Abs 3.7 1.7 - 7.7 K/uL   Lymphocytes Relative 32 %   Lymphs Abs 2.1 0.7 - 4.0 K/uL   Monocytes Relative 7 %   Monocytes Absolute 0.4 0.1 - 1.0 K/uL   Eosinophils Relative 4 %   Eosinophils Absolute 0.2 0.0 - 0.5 K/uL   Basophils Relative 1 %   Basophils Absolute 0.0 0.0 - 0.1 K/uL   Immature Granulocytes 0 %   Abs Immature Granulocytes 0.02 0.00 - 0.07 K/uL   EKG - NSR rate 60s, normal axis, intervals, no hypertrophy, anterolateral ST elevation thought repolarization variant, also present on prior EKG.   Assessment & Plan:   Problem List Items Addressed This Visit     Hypertension, secondary    Chronic, stable, controlled on current regimen. Update EKG today.  Missed endo appt yesterday (went to wrong location) - has rescheduled to 09/2023.  Discussed further CAD screening with coronary CT scan with calcium score - he will let me know if desires to proceed with this.       Relevant Orders   EKG 12-Lead (Completed)   Hypertriglyceridemia    Discussed recently elevated triglycerides, he  declines restarting fibrate at this time. Will recheck fasting lipid panel at 07/2023 f/u visit.      CKD (chronic kidney disease) stage 2, GFR 60-89 ml/min  Latest GFR improved to 69 Appreciate renal care.       MGUS (monoclonal gammopathy of unknown significance)    Seeing nephrology and heme/onc.       Strain of right rhomboid muscle - Primary    Exam consistent with this.  Supportive measures reviewed.  Provided with exercises from [facility] pt advisor.  Rx flexeril muscle relaxant with sedation precautions, continue muscle rub, gentle stretching.  Update if not improving with treatment.         Meds ordered this encounter  Medications   cyclobenzaprine (FLEXERIL) 10 MG tablet    Sig: Take 0.5-1 tablets (5-10 mg total) by mouth 2 (two) times daily as needed for muscle spasms (sedation).    Dispense:  30 tablet    Refill:  0    Orders Placed This Encounter  Procedures   EKG 12-Lead    Patient Instructions  Update EKG today  I think you had rhomboid strain - do exercises provided, continue biofreeze, may try flexeril muscle relaxant as needed (caution it can make you sleepy) Let us know if not improving with this.  Keep follow up appointment in September  Follow up plan: Return if symptoms worsen or fail to improve.  Eustaquio Boyden, MD

## 2023-03-27 NOTE — Patient Instructions (Addendum)
Update EKG today  I think you had rhomboid strain - do exercises provided, continue biofreeze, may try flexeril muscle relaxant as needed (caution it can make you sleepy) Let us know if not improving with this.  Keep follow up appointment in September

## 2023-03-27 NOTE — Assessment & Plan Note (Addendum)
Exam consistent with this.  Supportive measures reviewed.  Provided with exercises from [facility] pt advisor.  Rx flexeril muscle relaxant with sedation precautions, continue muscle rub, gentle stretching.  Update if not improving with treatment.

## 2023-03-29 ENCOUNTER — Encounter: Payer: Self-pay | Admitting: Internal Medicine

## 2023-03-29 ENCOUNTER — Inpatient Hospital Stay (HOSPITAL_BASED_OUTPATIENT_CLINIC_OR_DEPARTMENT_OTHER): Payer: BC Managed Care – PPO | Admitting: Internal Medicine

## 2023-03-29 VITALS — BP 142/97 | HR 77 | Temp 97.0°F | Wt 182.0 lb

## 2023-03-29 DIAGNOSIS — D472 Monoclonal gammopathy: Secondary | ICD-10-CM | POA: Diagnosis not present

## 2023-03-29 LAB — MULTIPLE MYELOMA PANEL, SERUM
Albumin SerPl Elph-Mcnc: 4.1 g/dL (ref 2.9–4.4)
Albumin/Glob SerPl: 1 (ref 0.7–1.7)
Alpha 1: 0.2 g/dL (ref 0.0–0.4)
Alpha2 Glob SerPl Elph-Mcnc: 0.6 g/dL (ref 0.4–1.0)
B-Globulin SerPl Elph-Mcnc: 1.2 g/dL (ref 0.7–1.3)
Gamma Glob SerPl Elph-Mcnc: 2.5 g/dL — ABNORMAL HIGH (ref 0.4–1.8)
Globulin, Total: 4.5 g/dL — ABNORMAL HIGH (ref 2.2–3.9)
IgA: 2182 mg/dL — ABNORMAL HIGH (ref 90–386)
IgG (Immunoglobin G), Serum: 1150 mg/dL (ref 603–1613)
IgM (Immunoglobulin M), Srm: 93 mg/dL (ref 20–172)
M Protein SerPl Elph-Mcnc: 1.3 g/dL — ABNORMAL HIGH
Total Protein ELP: 8.6 g/dL — ABNORMAL HIGH (ref 6.0–8.5)

## 2023-03-29 NOTE — Assessment & Plan Note (Signed)
Seeing nephrology and heme/onc.

## 2023-03-29 NOTE — Progress Notes (Signed)
[facility]  [phone] [phone]  ID: Shawn Vaughn OB: [birth year]  MR#: [id number]  AOZ#:[id number]  Patient Care Team: Shawn Boyden, MD as PCP - General (Family Medicine)  HPI: Shawn Vaughn is a 52 y.o. 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  Patient reports that he was found to have kidney disease sometime in July 2023.  Recently, creatinine has normalized.  No anemia.  Interval history Patient was seen today as follow-up to discuss labs for IgA lambda MGUS. Reports muscular pain in the right shoulder going down into the back of his arm for past 1 week.  Flexeril helped.  Using heating pad.  Pain has not resolved yet.  Denies any bone pain. Patient denies fever, chills, nausea, vomiting, shortness of breath, cough, abdominal pain, bleeding, bowel or bladder issues. Energy level is good.  Appetite is good.  Denies any weight loss. Denies pain.   REVIEW OF SYSTEMS:   ROS  As per HPI. Otherwise, a complete review of systems is negative.  PAST MEDICAL HISTORY: Past Medical History:  Diagnosis Date   History of chicken pox    History of pneumonia 11/19/2000   staph   Hypertension    Hypertriglyceridemia    Lyme disease 05/20/2015   Nephrolithiasis 07/21/2015   with R hydronephrosis that resolved   Shingles 04/2021   L flank   Solitary pulmonary nodule on lung CT 09/21/2015   4mm RLL, no f/u needed     PAST SURGICAL HISTORY: Past Surgical History:  Procedure Laterality Date   US ECHOCARDIOGRAPHY  2002   WNL per paper chart, EF >55%    FAMILY HISTORY: Family History  Problem Relation Age of Onset   Hypertension Mother    Hyperlipidemia Neg Hx    Diabetes Neg Hx    Coronary artery disease Neg Hx    Stroke Neg Hx    Cancer Maternal Grandfather 86       prostate   Prostate cancer Maternal Uncle    Nephrolithiasis Paternal  Uncle     HEALTH MAINTENANCE: Social History   Tobacco Use   Smoking status: Never   Smokeless tobacco: Never  Substance Use Topics   Alcohol use: No   Drug use: No     Allergies  Allergen Reactions   Ibuprofen     Blood in stool    Current Outpatient Medications  Medication Sig Dispense Refill   amLODipine-benazepril (LOTREL) 10-20 MG capsule TAKE 1 CAPSULE BY MOUTH EVERY DAY 90 capsule 4   Cholecalciferol (VITAMIN D3) 50 MCG (2000 UT) CAPS Take 1 capsule (2,000 Units total) by mouth daily. 30 capsule    cyclobenzaprine (FLEXERIL) 10 MG tablet Take 0.5-1 tablets (5-10 mg total) by mouth 2 (two) times daily as needed for muscle spasms (sedation). 30 tablet 0   hydrochlorothiazide (HYDRODIURIL) 25 MG tablet TAKE 1 TABLET BY MOUTH 1 TIME EACH DAY. 90 tablet 3   metoprolol succinate (TOPROL-XL) 100 MG 24 hr tablet TAKE 1 TABLET BY MOUTH DAILY. TAKE WITH OR IMMEDIATELY FOLLOWING A MEAL. 90 tablet 4   No current facility-administered medications for this visit.    OBJECTIVE: Vitals:   03/29/23 1044  BP: (!) 142/97  Pulse: 77  Temp: (!) 97 F (36.1 C)  SpO2: 100%     Body mass index is 28.08 kg/m.      General: Well-developed, well-nourished, no acute distress. Eyes: Pink conjunctiva, anicteric sclera. HEENT: Normocephalic, moist  mucous membranes, clear oropharnyx. Lungs: Clear to auscultation bilaterally. Heart: Regular rate and rhythm. No rubs, murmurs, or gallops. Abdomen: Soft, nontender, nondistended. No organomegaly noted, normoactive bowel sounds. Musculoskeletal: No edema, cyanosis, or clubbing. Neuro: Alert, answering all questions appropriately. Cranial nerves grossly intact. Skin: No rashes or petechiae noted. Psych: Normal affect. Lymphatics: No cervical, calvicular, axillary or inguinal LAD.   LAB RESULTS:  Lab Results  Component Value Date   NA 137 03/22/2023   K 3.7 03/22/2023   CL 99 03/22/2023   CO2 28 03/22/2023   GLUCOSE 109 (H) 03/22/2023    BUN 18 03/22/2023   CREATININE 1.12 03/22/2023   CALCIUM 9.4 03/22/2023   PROT 9.1 (H) 03/22/2023   ALBUMIN 4.3 03/22/2023   AST 29 03/22/2023   ALT 26 03/22/2023   ALKPHOS 63 03/22/2023   BILITOT 0.7 03/22/2023   GFRNONAA >60 03/22/2023   GFRAA 53 (L) 07/20/2015    Lab Results  Component Value Date   WBC 6.5 03/22/2023   NEUTROABS 3.7 03/22/2023   HGB 17.3 (H) 03/22/2023   HCT 48.9 03/22/2023   MCV 86.4 03/22/2023   PLT 259 03/22/2023    No results found for: "TIBC", "FERRITIN", "IRONPCTSAT"   STUDIES: No results found.  ASSESSMENT AND PLAN:   Shawn Vaughn is a 52 y.o. 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  For M spike 1.5 g/dL, non- IgG protein and normal kappa lambda ratio.  Absolute risk of progression to multiple myeloma 20 years is 37%.  -From 12/17/2022-M spike 1.5 IgA lambda, kappa 22.1, lambda 28.9 with normal ratio 0.76.  Creatinine 1.29.  Hemoglobin 17.  Urine immunofixation negative for monoclonal protein.  -Recent labs from 03/22/2023-SPEP/IFE pending, kappa 20.4, lambda 33.4 normal ratio 0.6.  Creatinine normal.  Hemoglobin 17.3.  -I discussed about the diagnosis of MGUS and the risk of progression to multiple myeloma.  He therefore needs monitoring.  He reports pain in the right upper back going down into back of his arm for past 1 week.  It is unlikely to be related to MGUS.  Otherwise he is asymptomatic.  Follow-up in 6 months.  # CKD stage II -Follows with Dr. Cherylann Ratel.  No albuminuria noted.  Creatinine has normalized  # Hypertension -On HCTZ, metoprolol, Lotrel   Orders Placed This Encounter  Procedures   CBC with Differential/Platelet   Comprehensive metabolic panel   Multiple Myeloma Panel (SPEP&IFE w/QIG)   Kappa/lambda light chains    RTC in 6 months for MD visit, labs.  Patient expressed understanding and was in agreement with this plan. He also understands that He can call clinic at any time with any questions, concerns, or complaints.   I spent a total of 25 minutes reviewing chart data, face-to-face evaluation with the patient, counseling and coordination of care as detailed above.  Michaelyn Barter, MD   03/29/2023 11:05 AM

## 2023-03-29 NOTE — Progress Notes (Signed)
Pt. Doesn't really have any new concerns for today's visit. Unrelated; Patients PCP told him that he pulled his muscle in his right arm, he rates his pain at a 7, and provider prescribed a muscle relaxer to help.

## 2023-04-08 NOTE — Progress Notes (Unsigned)
Name: Shawn Vaughn  MRN/ DOB: [id number], 1971-10-26    Age/ Sex: 52 y.o., male    PCP: Eustaquio Boyden, MD   Reason for Endocrinology Evaluation: HTN     Date of Initial Endocrinology Evaluation: 04/09/2023     HPI: [name] is a 52 y.o. male with a past medical history of HTN, Lyme disease, nephrolithiasis, and dyslipidemia. The patient presented for initial endocrinology clinic visit on 04/09/2023 for consultative assistance with his HTN.     In reviewing his chart, patient has been noted with elevated Aldo/PRA ratio and aldosterone level of 12 mg/DL, suppressed renin 4.09 NG/mL/H on 02/06/2022   At the time the patient was on antihypertensive medications to include amlodipine-benazepril, and Toprol-XL   Patient was evaluated by [facility] on 03/29/2022 for evaluation of CKD III  and refractory HTN.  HCTZ was added at the time  Repeat labs confirmed elevated Aldo/PRA ratio at 127.3 with aldosterone level of 14 NG/DL , suppressed renin 8.11 NG/mL/H ,  at the time he was noted with elevated a.m. cortisol at 26.7 mcg/DL (reference 9-14)   CT imaging of the abdomen 05/28/2022 showed normal adrenal glands    His last appointment with nephrology was on 03/21/2023 with improvement of his GFR.  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   Has been Dx with HTN ~ 2014  Denies headaches and vision changes  Denies chest pain nor sob  Denies palpitations  Denies LE edema    Mother with HTN   HISTORY:  Past Medical History:  Past Medical History:  Diagnosis Date   History of chicken pox    History of pneumonia 11/19/2000   staph   Hypertension    Hypertriglyceridemia    Lyme disease 05/20/2015   Nephrolithiasis 07/21/2015   with R hydronephrosis that resolved   Shingles 04/2021   L flank   Solitary pulmonary nodule on lung CT 09/21/2015   4mm RLL, no f/u needed    Past Surgical History:  Past Surgical History:   Procedure Laterality Date   US ECHOCARDIOGRAPHY  2002   WNL per paper chart, EF >55%    Social History:  reports that he has never smoked. He has never used smokeless tobacco. He reports that he does not drink alcohol and does not use drugs. Family History: family history includes Cancer (age of onset: 49) in his maternal grandfather; Hypertension in his mother; Nephrolithiasis in his paternal uncle; Prostate cancer in his maternal uncle.   HOME MEDICATIONS: Allergies as of 04/09/2023       Reactions   Ibuprofen    Blood in stool        Medication List        Accurate as of Apr 09, 2023  7:46 AM. If you have any questions, ask your nurse or doctor.          STOP taking these medications    hydrochlorothiazide 25 MG tablet Commonly known as: HYDRODIURIL Stopped by: Scarlette Shorts, MD       TAKE these medications    amLODipine-benazepril 10-20 MG capsule Commonly known as: LOTREL TAKE 1 CAPSULE BY MOUTH EVERY DAY   cyclobenzaprine 10 MG tablet Commonly known as: FLEXERIL Take 0.5-1 tablets (5-10 mg total) by mouth 2 (two) times daily as needed for muscle spasms (sedation).   eplerenone 25 MG tablet Commonly known as: INSPRA Take 1 tablet (25 mg total) by mouth daily. Started by: Scarlette Shorts,  MD   metoprolol succinate 100 MG 24 hr tablet Commonly known as: TOPROL-XL TAKE 1 TABLET BY MOUTH DAILY. TAKE WITH OR IMMEDIATELY FOLLOWING A MEAL.   Vitamin D3 50 MCG (2000 UT) capsule Take 1 capsule (2,000 Units total) by mouth daily.          REVIEW OF SYSTEMS: A comprehensive ROS was conducted with the patient and is negative except as per HPI  OBJECTIVE:  VS: BP 124/80   Pulse 88   Ht 5\' 8"  (1.727 m)   Wt 180 lb 12.8 oz (82 kg)   SpO2 99%   BMI 27.49 kg/m    Wt Readings from Last 3 Encounters:  04/09/23 180 lb 12.8 oz (82 kg)  03/29/23 182 lb (82.6 kg)  03/27/23 178 lb (80.7 kg)     EXAM: General: Pt appears well and is in NAD   Eyes: External eye exam normal without stare, lid lag or exophthalmos.  EOM intact.   Neck: General: Supple without adenopathy. Thyroid: Thyroid size normal.  No goiter or nodules appreciated.  Lungs: Clear with good BS bilat   Heart: Auscultation: RRR.  Abdomen: Soft, nontender  Extremities:  BL LE: No pretibial edema   Mental Status: Judgment, insight: Intact Orientation: Oriented to time, place, and person Mood and affect: No depression, anxiety, or agitation     DATA REVIEWED:     Latest Reference Range & Units 03/22/23 07:56  Sodium 135 - 145 mmol/L 137  Potassium 3.5 - 5.1 mmol/L 3.7  Chloride 98 - 111 mmol/L 99  CO2 22 - 32 mmol/L 28  Glucose 70 - 99 mg/dL 130 (H)  BUN 6 - 20 mg/dL 18  Creatinine 8.65 - 7.84 mg/dL 6.96  Calcium 8.9 - 29.5 mg/dL 9.4  Anion gap 5 - 15  10  Alkaline Phosphatase 38 - 126 U/L 63  Albumin 3.5 - 5.0 g/dL 4.3  AST 15 - 41 U/L 29  ALT 0 - 44 U/L 26  Total Protein 6.5 - 8.1 g/dL 9.1 (H)  Total Bilirubin 0.3 - 1.2 mg/dL 0.7  GFR, Estimated >28 mL/min >60    Latest Reference Range & Units Most Recent  Renin Activity 0.25 - 5.82 ng/mL/h 0.11 (L) 01/28/23 07:31      Latest Reference Range & Units 02/06/22 08:01  Renin Activity 0.25 - 5.82 ng/mL/h 0.06 (L)   Old records , labs and images have been reviewed.     ASSESSMENT/PLAN/RECOMMENDATIONS:   Hyperaldosteronism :   - Pt with hyperaldosteronism, NO need for confirmation testing as his renin is suppressed in the setting for ACEi  -  We discussed confirmation testing with adrenal venous sampling for localization purposes. -The patient is not interested in invasive testing at this time, we opted to start eplerenone for medical management -Patient will return in 1 month for blood pressure check as well as BMP checkup -Will stop HCTZ as below   Medications : Stop hydrochlorothiazide Start eplerenone 25 mg daily Continue metoprolol 100 mg XL daily Continue Lotrel 10-20 mg  daily   Follow-up in 6 months  Signed electronically by: Lyndle Herrlich, MD  [facility] Endocrinology  [facility] [address] [place] [place], [place] [address] Phone: [phone] FAX: [phone]   CC: Eustaquio Boyden, MD [address] [place] [address] Phone: [phone] Fax: [phone]   Return to Endocrinology clinic as below: Future Appointments  Date Time Provider Department Center  07/30/2023  8:00 AM Eustaquio Boyden, MD [facility]  09/16/2023  8:00 AM [facility] None  09/30/2023 10:30 AM Michaelyn Barter, MD [facility] None

## 2023-04-09 ENCOUNTER — Ambulatory Visit (INDEPENDENT_AMBULATORY_CARE_PROVIDER_SITE_OTHER): Payer: BC Managed Care – PPO | Admitting: Internal Medicine

## 2023-04-09 ENCOUNTER — Encounter: Payer: Self-pay | Admitting: Internal Medicine

## 2023-04-09 VITALS — BP 124/80 | HR 88 | Ht 68.0 in | Wt 180.8 lb

## 2023-04-09 DIAGNOSIS — E269 Hyperaldosteronism, unspecified: Secondary | ICD-10-CM | POA: Diagnosis not present

## 2023-04-09 MED ORDER — EPLERENONE 25 MG PO TABS: 25.0000 mg | ORAL_TABLET | Freq: Every day | ORAL | 3 refills | Status: DC

## 2023-04-09 NOTE — Patient Instructions (Addendum)
Stop Hydrochlorothiazide Start Epleronone 25 mg daily   Continue Metoprolol 100 mg daily  Continue Lotrel daily      Labs and Blood pressure check in a month

## 2023-04-11 ENCOUNTER — Encounter: Payer: Self-pay | Admitting: Nephrology

## 2023-05-08 ENCOUNTER — Other Ambulatory Visit: Payer: Self-pay | Admitting: Family Medicine

## 2023-05-08 DIAGNOSIS — S29012A Strain of muscle and tendon of back wall of thorax, initial encounter: Secondary | ICD-10-CM

## 2023-05-08 NOTE — Telephone Encounter (Signed)
Flexeril Last filled:  03/27/23, #30 Last OV:  03/27/23, R shoulder pain Next OV:  07/30/23, 6 mo f/u

## 2023-05-28 ENCOUNTER — Other Ambulatory Visit (INDEPENDENT_AMBULATORY_CARE_PROVIDER_SITE_OTHER): Payer: BC Managed Care – PPO

## 2023-05-28 DIAGNOSIS — E269 Hyperaldosteronism, unspecified: Secondary | ICD-10-CM

## 2023-05-28 LAB — BASIC METABOLIC PANEL
BUN: 18 mg/dL (ref 6–23)
CO2: 28 mEq/L (ref 19–32)
Calcium: 9.8 mg/dL (ref 8.4–10.5)
Chloride: 102 mEq/L (ref 96–112)
Creatinine, Ser: 1.28 mg/dL (ref 0.40–1.50)
GFR: 65.24 mL/min (ref >60.00)
Glucose, Bld: 96 mg/dL (ref 70–99)
Potassium: 4 mEq/L (ref 3.5–5.1)
Sodium: 138 mEq/L (ref 135–145)

## 2023-05-29 ENCOUNTER — Encounter: Payer: Self-pay | Admitting: Internal Medicine

## 2023-05-30 LAB — RENIN: Renin Activity, Plasma: <0.167 ng/mL/hr — ABNORMAL LOW (ref 0.167–5.380)

## 2023-05-31 ENCOUNTER — Other Ambulatory Visit: Payer: Self-pay | Admitting: Internal Medicine

## 2023-05-31 ENCOUNTER — Telehealth: Payer: Self-pay | Admitting: Internal Medicine

## 2023-05-31 MED ORDER — EPLERENONE 50 MG PO TABS: 50.0000 mg | ORAL_TABLET | Freq: Every day | ORAL | 3 refills | Status: DC

## 2023-05-31 NOTE — Telephone Encounter (Signed)
Patient advised and will make medication changes  

## 2023-05-31 NOTE — Telephone Encounter (Signed)
Please let the patient know that based on his blood test, he is not on enough eplerenone.  Please asked the patient to increase eplerenone from 25 mg daily to 50 milligram daily   A new prescription of eplerenone 50 mg, 1 tablet daily has been sent to the pharmacy, in the meantime he may double up on the 25 mg tablets until finished with the bottle

## 2023-07-30 ENCOUNTER — Encounter: Payer: Self-pay | Admitting: Family Medicine

## 2023-07-30 ENCOUNTER — Ambulatory Visit: Payer: BC Managed Care – PPO | Admitting: Family Medicine

## 2023-07-30 VITALS — BP 140/98 | HR 77 | Temp 97.5°F | Ht 68.0 in | Wt 183.0 lb

## 2023-07-30 DIAGNOSIS — Z1211 Encounter for screening for malignant neoplasm of colon: Secondary | ICD-10-CM

## 2023-07-30 DIAGNOSIS — N182 Chronic kidney disease, stage 2 (mild): Secondary | ICD-10-CM | POA: Diagnosis not present

## 2023-07-30 DIAGNOSIS — E781 Pure hyperglyceridemia: Secondary | ICD-10-CM

## 2023-07-30 DIAGNOSIS — I159 Secondary hypertension, unspecified: Secondary | ICD-10-CM | POA: Diagnosis not present

## 2023-07-30 DIAGNOSIS — E269 Hyperaldosteronism, unspecified: Secondary | ICD-10-CM

## 2023-07-30 DIAGNOSIS — D472 Monoclonal gammopathy: Secondary | ICD-10-CM

## 2023-07-30 DIAGNOSIS — R0683 Snoring: Secondary | ICD-10-CM

## 2023-07-30 LAB — RENAL FUNCTION PANEL
Albumin: 4.1 g/dL (ref 3.5–5.2)
BUN: 18 mg/dL (ref 6–23)
CO2: 28 meq/L (ref 19–32)
Calcium: 9.5 mg/dL (ref 8.4–10.5)
Chloride: 102 meq/L (ref 96–112)
Creatinine, Ser: 1.16 mg/dL (ref 0.40–1.50)
GFR: 73.33 mL/min (ref >60.00)
Glucose, Bld: 88 mg/dL (ref 70–99)
Phosphorus: 3.1 mg/dL (ref 2.3–4.6)
Potassium: 4.1 meq/L (ref 3.5–5.1)
Sodium: 137 meq/L (ref 135–145)

## 2023-07-30 LAB — LIPID PANEL
Cholesterol: 106 mg/dL (ref 0–200)
HDL: 40.1 mg/dL (ref >39.00)
LDL Cholesterol: 31 mg/dL (ref 0–99)
NonHDL: 65.89
Total CHOL/HDL Ratio: 3
Triglycerides: 174 mg/dL — ABNORMAL HIGH (ref 0.0–149.0)
VLDL: 34.8 mg/dL (ref 0.0–40.0)

## 2023-07-30 NOTE — Assessment & Plan Note (Addendum)
Chronic, deteriorated control. Anticipate component of white coat hypertension.   Will restart checking at home and let us know if consistently >140\90 to titrate antihypertensive accordingly. BP log provided.

## 2023-07-30 NOTE — Progress Notes (Signed)
Ph: [phone] Fax: [phone]   Patient ID: Shawn Vaughn, male    DOB: [birth year], 52 y.o.   MRN: [id number]  This visit was conducted in person.  BP (!) 140/98   Pulse 77   Temp (!) 97.5 F (36.4 C) (Temporal)   Ht 5\' 8"  (1.727 m)   Wt 183 lb (83 kg)   SpO2 99%   BMI 27.83 kg/m   BP Readings from Last 3 Encounters:  07/30/23 (!) 140/98  04/09/23 124/80  03/29/23 (!) 142/97  Elevated on repeat testing.   CC: 6 mo f/u visit  Subjective:   HPI: Shawn Vaughn is a 52 y.o. male presenting on 07/30/2023 for Medical Management of Chronic Issues (Here for 6 mo f/u.)   CKD - sees nephrology Dr Cherylann Ratel.   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   Hyperaldosteronism with elevated cortisol levels - saw endo Dr Lonzo Cloud   Component of white coat hypertension - has not been checking home BP readings. Continues lotrel 10/20mg  daily and eplerenone 50mg  daily (replaced hydrochlorothiazide) along with Toprol XL 50mg  daily.  No HA, vision changes, CP/tightness, SOB, leg swelling.   Hypertriglyceridemia - fenofibrate stopped 2023 due to concern over contribution to CKD. Considering starting statin.   Colon cancer screening - still considering options. Normally takes the month of November off.   Notes ongoing loud snoring.  No fmhx CAD, stroke.      Relevant past medical, surgical, family and social history reviewed and updated as indicated. Interim medical history since our last visit reviewed. Allergies and medications reviewed and updated. Outpatient Medications Prior to Visit  Medication Sig Dispense Refill   amLODipine-benazepril (LOTREL) 10-20 MG capsule TAKE 1 CAPSULE BY MOUTH EVERY DAY 90 capsule 4   Cholecalciferol (VITAMIN D3) 50 MCG (2000 UT) CAPS Take 1 capsule (2,000 Units total) by mouth daily. 30 capsule    eplerenone (INSPRA) 50 MG tablet Take 1 tablet (50 mg total) by mouth daily. 90 tablet 3   metoprolol succinate  (TOPROL-XL) 100 MG 24 hr tablet TAKE 1 TABLET BY MOUTH DAILY. TAKE WITH OR IMMEDIATELY FOLLOWING A MEAL. 90 tablet 4   cyclobenzaprine (FLEXERIL) 10 MG tablet TAKE 1/2-1 TABLET (5-10 MG TOTAL) BY MOUTH 2 TIMES DAILY AS NEEDED FOR MUSCLE SPASMS (SEDATION). 30 tablet 0   No facility-administered medications prior to visit.     Per HPI unless specifically indicated in ROS section below Review of Systems  Objective:  BP (!) 140/98   Pulse 77   Temp (!) 97.5 F (36.4 C) (Temporal)   Ht 5\' 8"  (1.727 m)   Wt 183 lb (83 kg)   SpO2 99%   BMI 27.83 kg/m   Wt Readings from Last 3 Encounters:  07/30/23 183 lb (83 kg)  04/09/23 180 lb 12.8 oz (82 kg)  03/29/23 182 lb (82.6 kg)      Physical Exam Vitals and nursing note reviewed.  Constitutional:      Appearance: Normal appearance. He is not ill-appearing.  HENT:     Head: Normocephalic and atraumatic.     Mouth/Throat:     Comments: Wearing mask Eyes:     Extraocular Movements: Extraocular movements intact.     Pupils: Pupils are equal, round, and reactive to light.  Neck:     Thyroid: No thyroid mass or thyromegaly.  Cardiovascular:     Rate and Rhythm: Normal rate and regular rhythm.     Pulses: Normal pulses.  Heart sounds: Normal heart sounds. No murmur heard. Pulmonary:     Effort: Pulmonary effort is normal. No respiratory distress.     Breath sounds: Normal breath sounds. No wheezing, rhonchi or rales.  Musculoskeletal:     Cervical back: Normal range of motion and neck supple. No rigidity.     Right lower leg: No edema.     Left lower leg: No edema.  Lymphadenopathy:     Cervical: No cervical adenopathy.  Skin:    General: Skin is warm and dry.     Findings: No rash.  Neurological:     Mental Status: He is alert.  Psychiatric:        Mood and Affect: Mood normal.        Behavior: Behavior normal.       Results for orders placed or performed in visit on 05/28/23  Renin  Result Value Ref Range   Renin  Activity, Plasma <0.167 (L) 0.167 - 5.380 ng/mL/hr  Basic metabolic panel  Result Value Ref Range   Sodium 138 135 - 145 mEq/L   Potassium 4.0 3.5 - 5.1 mEq/L   Chloride 102 96 - 112 mEq/L   CO2 28 19 - 32 mEq/L   Glucose, Bld 96 70 - 99 mg/dL   BUN 18 6 - 23 mg/dL   Creatinine, Ser 6.96 0.40 - 1.50 mg/dL   GFR 29.52 >84.13 mL/min   Calcium 9.8 8.4 - 10.5 mg/dL   Lab Results  Component Value Date   CHOL 130 01/28/2023   HDL 33.40 (L) 01/28/2023   LDLCALC 49 05/04/2022   LDLDIRECT 43.0 01/28/2023   TRIG 314.0 (H) 01/28/2023   CHOLHDL 4 01/28/2023    Assessment & Plan:   Problem List Items Addressed This Visit     Hypertension, secondary - Primary    Chronic, deteriorated control. Anticipate component of white coat hypertension.   Will restart checking at home and let us know if consistently >140\90 to titrate antihypertensive accordingly. BP log provided.       Hypertriglyceridemia    Update FLP as fasting.  Consider statin pending results.       Relevant Orders   Lipid panel   CKD (chronic kidney disease) stage 2, GFR 60-89 ml/min    Appreciate renal care.  Kidney function has been running better.  Continue current regimen.       Snoring    Notes ongoing snoring, however without significant daytime somnolence.  Update ESS today = 3.  Doubt OSA related.       Hyperaldosteronism (HCC)    Established with endocrinology [facility]) now on eplerenone.       MGUS (monoclonal gammopathy of unknown significance)   Other Visit Diagnoses     Special screening for malignant neoplasms, colon       Relevant Orders   Ambulatory referral to Gastroenterology   Lipid panel   Renal function panel        No orders of the defined types were placed in this encounter.   Orders Placed This Encounter  Procedures   Lipid panel   Renal function panel   Ambulatory referral to Gastroenterology    Referral Priority:   Routine    Referral Type:   Consultation     Referral Reason:   Specialty Services Required    Number of Visits Requested:   1    Patient Instructions  Restart monitoring blood pressures at home. Let me know if consistently >140/90.  Labs today. Pending results  we may start new cholesterol medicine.   Follow up plan: Return in about 6 months (around 02/06/2024), or if symptoms worsen or fail to improve, for annual exam, prior fasting for blood work.  Eustaquio Boyden, MD

## 2023-07-30 NOTE — Assessment & Plan Note (Addendum)
Update FLP as fasting.  Consider statin pending results.

## 2023-07-30 NOTE — Patient Instructions (Signed)
Restart monitoring blood pressures at home. Let me know if consistently >140/90.  Labs today. Pending results we may start new cholesterol medicine.

## 2023-07-30 NOTE — Assessment & Plan Note (Addendum)
Notes ongoing snoring, however without significant daytime somnolence.  Update ESS today = 3.  Doubt OSA related.

## 2023-07-30 NOTE — Assessment & Plan Note (Signed)
Appreciate renal care.  Kidney function has been running better.  Continue current regimen.

## 2023-07-30 NOTE — Assessment & Plan Note (Signed)
Established with endocrinology [facility]) now on eplerenone.

## 2023-08-08 ENCOUNTER — Telehealth: Payer: Self-pay

## 2023-08-08 NOTE — Telephone Encounter (Signed)
PT  requesting call back to schedule colonosopy

## 2023-08-09 ENCOUNTER — Other Ambulatory Visit: Payer: Self-pay | Admitting: *Deleted

## 2023-08-09 ENCOUNTER — Telehealth: Payer: Self-pay | Admitting: *Deleted

## 2023-08-09 DIAGNOSIS — Z1211 Encounter for screening for malignant neoplasm of colon: Secondary | ICD-10-CM

## 2023-08-09 MED ORDER — NA SULFATE-K SULFATE-MG SULF 17.5-3.13-1.6 GM/177ML PO SOLN: 1.0000 | Freq: Once | ORAL | 0 refills | Status: AC

## 2023-08-09 NOTE — Telephone Encounter (Signed)
Gastroenterology Pre-Procedure Review  Request Date: 09/25/2023 Requesting Physician: Dr. Tobi Bastos  PATIENT REVIEW QUESTIONS: The patient responded to the following health history questions as indicated:    1. Are you having any GI issues? no 2. Do you have a personal history of Polyps? no 3. Do you have a family history of Colon Cancer or Polyps? no 4. Diabetes Mellitus? no 5. Joint replacements in the past 12 months?no 6. Major health problems in the past 3 months?no 7. Any artificial heart valves, MVP, or defibrillator?no    MEDICATIONS & ALLERGIES:    Patient reports the following regarding taking any anticoagulation/antiplatelet therapy:   Plavix, Coumadin, Eliquis, Xarelto, Lovenox, Pradaxa, Brilinta, or Effient? no Aspirin? no  Patient confirms/reports the following medications:  Current Outpatient Medications  Medication Sig Dispense Refill   amLODipine-benazepril (LOTREL) 10-20 MG capsule TAKE 1 CAPSULE BY MOUTH EVERY DAY 90 capsule 4   Cholecalciferol (VITAMIN D3) 50 MCG (2000 UT) CAPS Take 1 capsule (2,000 Units total) by mouth daily. 30 capsule    eplerenone (INSPRA) 50 MG tablet Take 1 tablet (50 mg total) by mouth daily. 90 tablet 3   metoprolol succinate (TOPROL-XL) 100 MG 24 hr tablet TAKE 1 TABLET BY MOUTH DAILY. TAKE WITH OR IMMEDIATELY FOLLOWING A MEAL. 90 tablet 4   No current facility-administered medications for this visit.    Patient confirms/reports the following allergies:  Allergies  Allergen Reactions   Ibuprofen     Blood in stool    No orders of the defined types were placed in this encounter.   AUTHORIZATION INFORMATION Primary Insurance: 1D#: Group #:  Secondary Insurance: 1D#: Group #:  SCHEDULE INFORMATION: Date: 09/25/2023 Time: Location: [facility]

## 2023-08-09 NOTE — Telephone Encounter (Signed)
Colonoscopy schedule on 09/25/2023 with Dr Tobi Bastos

## 2023-09-16 ENCOUNTER — Inpatient Hospital Stay: Payer: BC Managed Care – PPO | Attending: Internal Medicine

## 2023-09-16 DIAGNOSIS — Z79899 Other long term (current) drug therapy: Secondary | ICD-10-CM | POA: Insufficient documentation

## 2023-09-16 DIAGNOSIS — D472 Monoclonal gammopathy: Secondary | ICD-10-CM

## 2023-09-16 LAB — COMPREHENSIVE METABOLIC PANEL
ALT: 28 U/L (ref 0–44)
AST: 24 U/L (ref 15–41)
Albumin: 4.4 g/dL (ref 3.5–5.0)
Alkaline Phosphatase: 72 U/L (ref 38–126)
Anion gap: 10 (ref 5–15)
BUN: 17 mg/dL (ref 6–20)
CO2: 25 mmol/L (ref 22–32)
Calcium: 9.4 mg/dL (ref 8.9–10.3)
Chloride: 101 mmol/L (ref 98–111)
Creatinine, Ser: 1.26 mg/dL — ABNORMAL HIGH (ref 0.61–1.24)
GFR, Estimated: >60 mL/min (ref >60)
Glucose, Bld: 102 mg/dL — ABNORMAL HIGH (ref 70–99)
Potassium: 4.1 mmol/L (ref 3.5–5.1)
Sodium: 136 mmol/L (ref 135–145)
Total Bilirubin: 0.8 mg/dL (ref 0.3–1.2)
Total Protein: 9.4 g/dL — ABNORMAL HIGH (ref 6.5–8.1)

## 2023-09-16 LAB — CBC WITH DIFFERENTIAL/PLATELET
Abs Immature Granulocytes: 0.02 10*3/uL (ref 0.00–0.07)
Basophils Absolute: 0.1 10*3/uL (ref 0.0–0.1)
Basophils Relative: 1 %
Eosinophils Absolute: 0.2 10*3/uL (ref 0.0–0.5)
Eosinophils Relative: 3 %
HCT: 48.4 % (ref 39.0–52.0)
Hemoglobin: 17.1 g/dL — ABNORMAL HIGH (ref 13.0–17.0)
Immature Granulocytes: 0 %
Lymphocytes Relative: 33 %
Lymphs Abs: 1.9 10*3/uL (ref 0.7–4.0)
MCH: 30.8 pg (ref 26.0–34.0)
MCHC: 35.3 g/dL (ref 30.0–36.0)
MCV: 87.1 fL (ref 80.0–100.0)
Monocytes Absolute: 0.4 10*3/uL (ref 0.1–1.0)
Monocytes Relative: 8 %
Neutro Abs: 3.2 10*3/uL (ref 1.7–7.7)
Neutrophils Relative %: 55 %
Platelets: 261 10*3/uL (ref 150–400)
RBC: 5.56 MIL/uL (ref 4.22–5.81)
RDW: 13 % (ref 11.5–15.5)
WBC: 5.7 10*3/uL (ref 4.0–10.5)
nRBC: 0 % (ref 0.0–0.2)

## 2023-09-17 LAB — KAPPA/LAMBDA LIGHT CHAINS
Kappa free light chain: 20.1 mg/L — ABNORMAL HIGH (ref 3.3–19.4)
Kappa, lambda light chain ratio: 0.64 (ref 0.26–1.65)
Lambda free light chains: 31.2 mg/L — ABNORMAL HIGH (ref 5.7–26.3)

## 2023-09-18 ENCOUNTER — Encounter: Payer: Self-pay | Admitting: Gastroenterology

## 2023-09-24 ENCOUNTER — Encounter: Payer: Self-pay | Admitting: Gastroenterology

## 2023-09-24 LAB — MULTIPLE MYELOMA PANEL, SERUM
Albumin SerPl Elph-Mcnc: 4.1 g/dL (ref 2.9–4.4)
Albumin/Glob SerPl: 0.9 (ref 0.7–1.7)
Alpha 1: 0.2 g/dL (ref 0.0–0.4)
Alpha2 Glob SerPl Elph-Mcnc: 0.6 g/dL (ref 0.4–1.0)
B-Globulin SerPl Elph-Mcnc: 1.3 g/dL (ref 0.7–1.3)
Gamma Glob SerPl Elph-Mcnc: 2.5 g/dL — ABNORMAL HIGH (ref 0.4–1.8)
Globulin, Total: 4.6 g/dL — ABNORMAL HIGH (ref 2.2–3.9)
IgA: 2444 mg/dL — ABNORMAL HIGH (ref 90–386)
IgG (Immunoglobin G), Serum: 1128 mg/dL (ref 603–1613)
IgM (Immunoglobulin M), Srm: 109 mg/dL (ref 20–172)
M Protein SerPl Elph-Mcnc: 1.7 g/dL — ABNORMAL HIGH
Total Protein ELP: 8.7 g/dL — ABNORMAL HIGH (ref 6.0–8.5)

## 2023-09-25 ENCOUNTER — Other Ambulatory Visit: Payer: Self-pay

## 2023-09-25 ENCOUNTER — Ambulatory Visit
Admission: RE | Admit: 2023-09-25 | Discharge: 2023-09-25 | Disposition: A | Discharge status: Home / Self Care | Payer: BC Managed Care – PPO | Attending: Gastroenterology | Admitting: Gastroenterology

## 2023-09-25 ENCOUNTER — Ambulatory Visit: Payer: BC Managed Care – PPO | Admitting: Anesthesiology

## 2023-09-25 ENCOUNTER — Encounter
Admission: RE | Disposition: A | Discharge status: Home / Self Care | Payer: Self-pay | Source: Home / Self Care | Attending: Gastroenterology

## 2023-09-25 ENCOUNTER — Encounter: Payer: Self-pay | Admitting: Gastroenterology

## 2023-09-25 DIAGNOSIS — Z1211 Encounter for screening for malignant neoplasm of colon: Secondary | ICD-10-CM | POA: Insufficient documentation

## 2023-09-25 DIAGNOSIS — I129 Hypertensive chronic kidney disease with stage 1 through stage 4 chronic kidney disease, or unspecified chronic kidney disease: Secondary | ICD-10-CM | POA: Diagnosis not present

## 2023-09-25 DIAGNOSIS — K5730 Diverticulosis of large intestine without perforation or abscess without bleeding: Secondary | ICD-10-CM | POA: Insufficient documentation

## 2023-09-25 DIAGNOSIS — N182 Chronic kidney disease, stage 2 (mild): Secondary | ICD-10-CM | POA: Diagnosis not present

## 2023-09-25 HISTORY — PX: COLONOSCOPY WITH PROPOFOL: SHX5780

## 2023-09-25 SURGERY — COLONOSCOPY WITH PROPOFOL
Anesthesia: General

## 2023-09-25 MED ORDER — DEXMEDETOMIDINE HCL IN NACL 80 MCG/20ML IV SOLN
INTRAVENOUS | Status: DC | PRN
  Administered 2023-09-25: 12 ug via INTRAVENOUS
  Administered 2023-09-25: 8 ug via INTRAVENOUS

## 2023-09-25 MED ORDER — PROPOFOL 10 MG/ML IV BOLUS
INTRAVENOUS | Status: DC | PRN
  Administered 2023-09-25: 80 mg via INTRAVENOUS

## 2023-09-25 MED ORDER — SODIUM CHLORIDE 0.9 % IV SOLN: INTRAVENOUS | Status: DC

## 2023-09-25 MED ORDER — MIDAZOLAM HCL 2 MG/2ML IJ SOLN
INTRAMUSCULAR | Status: DC | PRN
  Administered 2023-09-25: 2 mg via INTRAVENOUS

## 2023-09-25 MED ORDER — PROPOFOL 500 MG/50ML IV EMUL
INTRAVENOUS | Status: DC | PRN
  Administered 2023-09-25: 150 ug/kg/min via INTRAVENOUS

## 2023-09-25 MED ORDER — MIDAZOLAM HCL 2 MG/2ML IJ SOLN
INTRAMUSCULAR | Status: AC
Start: 2023-09-25 — End: ?
  Filled 2023-09-25: qty 2

## 2023-09-25 NOTE — Anesthesia Preprocedure Evaluation (Addendum)
Anesthesia Evaluation  Patient identified by MRN, date of birth, ID band Patient awake    Reviewed: Allergy & Precautions, NPO status , Patient's Chart, lab work & pertinent test results  History of Anesthesia Complications Negative for: history of anesthetic complications  Airway Mallampati: III   Neck ROM: Full    Dental  (+) Missing, Chipped   Pulmonary neg pulmonary ROS   Pulmonary exam normal breath sounds clear to auscultation       Cardiovascular hypertension, Normal cardiovascular exam Rhythm:Regular Rate:Normal  ECG 03/27/23:  Sinus Rhythm  -Anterolateral ST-elevation -repolarization variant.   Neuro/Psych negative neurological ROS     GI/Hepatic negative GI ROS,,,  Endo/Other  negative endocrine ROS    Renal/GU Renal disease (nephrolithiasis; stage II CKD)     Musculoskeletal   Abdominal   Peds  Hematology negative hematology ROS (+)   Anesthesia Other Findings   Reproductive/Obstetrics                             Anesthesia Physical Anesthesia Plan  ASA: 2  Anesthesia Plan: General   Post-op Pain Management:    Induction: Intravenous  PONV Risk Score and Plan: 2 and Propofol infusion, TIVA and Treatment may vary due to age or medical condition  Airway Management Planned: Natural Airway  Additional Equipment:   Intra-op Plan:   Post-operative Plan:   Informed Consent: I have reviewed the patients History and Physical, chart, labs and discussed the procedure including the risks, benefits and alternatives for the proposed anesthesia with the patient or authorized representative who has indicated his/her understanding and acceptance.       Plan Discussed with: CRNA  Anesthesia Plan Comments: (LMA/GETA backup discussed.  Patient consented for risks of anesthesia including but not limited to:  - adverse reactions to medications - damage to eyes, teeth, lips or  other oral mucosa - nerve damage due to positioning  - sore throat or hoarseness - damage to heart, brain, nerves, lungs, other parts of body or loss of life  Informed patient about role of CRNA in peri- and intra-operative care.  Patient voiced understanding.)        Anesthesia Quick Evaluation

## 2023-09-25 NOTE — Op Note (Signed)
[facility] Gastroenterology Patient Name: Shawn Vaughn Procedure Date: 09/25/2023 8:32 AM MRN: [id number] Account #: [id number] Date of Birth: January 05, 1971 Admit Type: Outpatient Age: 52 Room: [facility] ENDO ROOM 3 Gender: Male Note Status: Finalized Instrument Name: Prentice Docker [id number] Procedure:             Colonoscopy Indications:           Screening for colorectal malignant neoplasm Providers:             Wyline Mood MD, MD Referring MD:          Eustaquio Boyden (Referring MD) Medicines:             Monitored Anesthesia Care Complications:         No immediate complications. Procedure:             Pre-Anesthesia Assessment:                        - Prior to the procedure, a History and Physical was                         performed, and patient medications, allergies and                         sensitivities were reviewed. The patient's tolerance                         of previous anesthesia was reviewed.                        - The risks and benefits of the procedure and the                         sedation options and risks were discussed with the                         patient. All questions were answered and informed                         consent was obtained.                        - ASA Grade Assessment: II - A patient with mild                         systemic disease.                        After obtaining informed consent, the colonoscope was                         passed under direct vision. Throughout the procedure,                         the patient's blood pressure, pulse, and oxygen                         saturations were monitored continuously. The                         Colonoscope was introduced through  the anus and                         advanced to the the cecum, identified by the                         appendiceal orifice. The colonoscopy was performed                         with ease. The patient tolerated the procedure well.                          The quality of the bowel preparation was excellent.                         The ileocecal valve, appendiceal orifice, and rectum                         were photographed. Findings:      Multiple small-mouthed diverticula were found in the entire colon.      The exam was otherwise without abnormality on direct and retroflexion       views.      The exam was otherwise without abnormality on direct and retroflexion       views. Impression:            - Diverticulosis in the entire examined colon.                        - The examination was otherwise normal on direct and                         retroflexion views.                        - The examination was otherwise normal on direct and                         retroflexion views.                        - No specimens collected. Recommendation:        - Discharge patient to home (with escort).                        - Resume previous diet.                        - Continue present medications.                        - Repeat colonoscopy in 10 years for screening                         purposes. Procedure Code(s):     --- Professional ---                        [phone], Colonoscopy, flexible; diagnostic, including                         collection of specimen(s) by brushing or  washing, when                         performed (separate procedure) Diagnosis Code(s):     --- Professional ---                        Z12.11, Encounter for screening for malignant neoplasm                         of colon                        K57.30, Diverticulosis of large intestine without                         perforation or abscess without bleeding CPT copyright 2022 [employer]. All rights reserved. The codes documented in this report are preliminary and upon coder review may  be revised to meet current compliance requirements. Wyline Mood, MD Wyline Mood MD, MD 09/25/2023 9:06:39 AM This report has been signed  electronically. Number of Addenda: 0 Note Initiated On: 09/25/2023 8:32 AM Scope Withdrawal Time: 0 hours 6 minutes 56 seconds  Total Procedure Duration: 0 hours 8 minutes 57 seconds  Estimated Blood Loss:  Estimated blood loss: none.      [facility]

## 2023-09-25 NOTE — H&P (Signed)
Wyline Mood, MD [address], [place], [place], [address] [address], [place], [place], [address] Phone: [phone]  Fax: [phone]  Primary Care Physician:  Eustaquio Boyden, MD   Pre-Procedure History & Physical: HPI:  Shawn Vaughn is a 52 y.o. male is here for an colonoscopy.   Past Medical History:  Diagnosis Date   History of chicken pox    History of pneumonia 11/19/2000   staph   Hypertension    Hypertriglyceridemia    Lyme disease 05/20/2015   Nephrolithiasis 07/21/2015   with R hydronephrosis that resolved   Shingles 04/2021   L flank   Solitary pulmonary nodule on lung CT 09/21/2015   4mm RLL, no f/u needed     Past Surgical History:  Procedure Laterality Date   US ECHOCARDIOGRAPHY  2002   WNL per paper chart, EF >55%    Prior to Admission medications   Medication Sig Start Date End Date Taking? Authorizing Provider  amLODipine-benazepril (LOTREL) 10-20 MG capsule TAKE 1 CAPSULE BY MOUTH EVERY DAY 02/14/23   Eustaquio Boyden, MD  Cholecalciferol (VITAMIN D3) 50 MCG (2000 UT) CAPS Take 1 capsule (2,000 Units total) by mouth daily. 02/06/23   Eustaquio Boyden, MD  eplerenone (INSPRA) 50 MG tablet Take 1 tablet (50 mg total) by mouth daily. 05/31/23   Shamleffer, Konrad Dolores, MD  metoprolol succinate (TOPROL-XL) 100 MG 24 hr tablet TAKE 1 TABLET BY MOUTH DAILY. TAKE WITH OR IMMEDIATELY FOLLOWING A MEAL. 02/12/23   Eustaquio Boyden, MD    Allergies as of 08/09/2023 - Review Complete 07/30/2023  Allergen Reaction Noted   Ibuprofen  03/12/2013    Family History  Problem Relation Age of Onset   Hypertension Mother    Hyperlipidemia Neg Hx    Diabetes Neg Hx    Coronary artery disease Neg Hx    Stroke Neg Hx    Cancer Maternal Grandfather 62       prostate   Prostate cancer Maternal Uncle    Nephrolithiasis Paternal Uncle     Social History   Socioeconomic History   Marital status: Single    Spouse name: Not  on file   Number of children: Not on file   Years of education: Not on file   Highest education level: Not on file  Occupational History   Not on file  Tobacco Use   Smoking status: Never   Smokeless tobacco: Never  Vaping Use   Vaping status: Never Used  Substance and Sexual Activity   Alcohol use: No   Drug use: No   Sexual activity: Not Currently    Birth control/protection: None  Other Topics Concern   Not on file  Social History Narrative   Caffeine: occasional sodas   Lives with mom, and dad but has his own place, 4 dogs   Occupation: works at [employer]   Edu: HS   Activity: hunts 3x/wk   Diet: good water, fruits/vegetables daily   Social Determinants of Corporate investment banker Strain: Not on file  Food Insecurity: No Food Insecurity (12/17/2022)   Hunger Vital Sign    Worried About Running Out of Food in the Last Year: Never true    Ran Out of Food in the Last Year: Never true  Transportation Needs: No Transportation Needs (12/17/2022)   PRAPARE - Administrator, Civil Service (Medical): No    Lack of Transportation (Non-Medical): No  Physical Activity: Not on file  Stress:  Not on file  Social Connections: Unknown (05/17/2022)   Received [facility], [facility]   Social Network    Social Network: Not on file  Intimate Partner Violence: Not At Risk (12/17/2022)   Humiliation, Afraid, Rape, and Kick questionnaire    Fear of Current or Ex-Partner: No    Emotionally Abused: No    Physically Abused: No    Sexually Abused: No    Review of Systems: See HPI, otherwise negative ROS  Physical Exam: There were no vitals taken for this visit. General:   Alert,  pleasant and cooperative in NAD Head:  Normocephalic and atraumatic. Neck:  Supple; no masses or thyromegaly. Lungs:  Clear throughout to auscultation, normal respiratory effort.    Heart:  +S1, +S2, Regular rate and rhythm, No edema. Abdomen:  Soft, nontender and nondistended. Normal  bowel sounds, without guarding, and without rebound.   Neurologic:  Alert and  oriented x4;  grossly normal neurologically.  Impression/Plan: Shawn Vaughn is here for an colonoscopy to be performed for Screening colonoscopy average risk   Risks, benefits, limitations, and alternatives regarding  colonoscopy have been reviewed with the patient.  Questions have been answered.  All parties agreeable.   Wyline Mood, MD  09/25/2023, 8:30 AM

## 2023-09-25 NOTE — Transfer of Care (Signed)
Immediate Anesthesia Transfer of Care Note  Patient: Shawn Vaughn  Procedure(s) Performed: COLONOSCOPY WITH PROPOFOL  Patient Location: Endoscopy Unit  Anesthesia Type:General  Level of Consciousness: drowsy  Airway & Oxygen Therapy: Patient Spontanous Breathing  Post-op Assessment: Report given to RN  Post vital signs: stable  Last Vitals:  Vitals Value Taken Time  BP 92/61 09/25/23 0910  Temp    Pulse 108 09/25/23 0910  Resp    SpO2 99 % 09/25/23 0910  Vitals shown include unfiled device data.  Last Pain:  Vitals:   09/25/23 0909  TempSrc:   PainSc: 0-No pain         Complications: No notable events documented.

## 2023-09-25 NOTE — Anesthesia Postprocedure Evaluation (Signed)
Anesthesia Post Note  Patient: Shawn Vaughn  Procedure(s) Performed: COLONOSCOPY WITH PROPOFOL  Patient location during evaluation: PACU Anesthesia Type: General Level of consciousness: awake and alert, oriented and patient cooperative Pain management: pain level controlled Vital Signs Assessment: post-procedure vital signs reviewed and stable Respiratory status: spontaneous breathing, nonlabored ventilation and respiratory function stable Cardiovascular status: blood pressure returned to baseline and stable Postop Assessment: adequate PO intake Anesthetic complications: no   No notable events documented.   Last Vitals:  Vitals:   09/25/23 0919 09/25/23 0929  BP: 91/66 103/78  Pulse: (!) 101 (!) 103  Temp:    SpO2: 98% 100%    Last Pain:  Vitals:   09/25/23 0929  TempSrc:   PainSc: 0-No pain                 Reed Breech

## 2023-09-26 ENCOUNTER — Encounter: Payer: Self-pay | Admitting: Gastroenterology

## 2023-09-30 ENCOUNTER — Inpatient Hospital Stay: Payer: BC Managed Care – PPO | Attending: Internal Medicine | Admitting: Internal Medicine

## 2023-09-30 VITALS — BP 140/92 | HR 80 | Temp 96.9°F | Wt 184.0 lb

## 2023-09-30 DIAGNOSIS — D472 Monoclonal gammopathy: Secondary | ICD-10-CM | POA: Diagnosis not present

## 2023-09-30 DIAGNOSIS — E781 Pure hyperglyceridemia: Secondary | ICD-10-CM | POA: Diagnosis not present

## 2023-09-30 DIAGNOSIS — I129 Hypertensive chronic kidney disease with stage 1 through stage 4 chronic kidney disease, or unspecified chronic kidney disease: Secondary | ICD-10-CM | POA: Diagnosis not present

## 2023-09-30 DIAGNOSIS — Z87442 Personal history of urinary calculi: Secondary | ICD-10-CM | POA: Diagnosis not present

## 2023-09-30 DIAGNOSIS — Z886 Allergy status to analgesic agent status: Secondary | ICD-10-CM | POA: Diagnosis not present

## 2023-09-30 DIAGNOSIS — Z79899 Other long term (current) drug therapy: Secondary | ICD-10-CM | POA: Insufficient documentation

## 2023-09-30 DIAGNOSIS — Z8249 Family history of ischemic heart disease and other diseases of the circulatory system: Secondary | ICD-10-CM | POA: Insufficient documentation

## 2023-09-30 DIAGNOSIS — N182 Chronic kidney disease, stage 2 (mild): Secondary | ICD-10-CM | POA: Diagnosis not present

## 2023-09-30 NOTE — Progress Notes (Addendum)
[facility]  [phone] [phone]  ID: Shawn Vaughn OB: [birth year]  MR#: [id number]  AOZ#:[id number]  Patient Care Team: Eustaquio Boyden, MD as PCP - General (Family Medicine)  HPI: Shawn Vaughn is a 52 y.o. 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  Patient reports that he was found to have kidney disease sometime in July 2023.  Recently, creatinine has normalized.  No anemia.  Interval history Patient was seen today as follow-up to discuss labs for IgA lambda MGUS. Patient has been doing well overall.  Recently had colonoscopy which was negative and repeat was recommended in 10 years.  Denies any bone pain   REVIEW OF SYSTEMS:   ROS  As per HPI. Otherwise, a complete review of systems is negative.  PAST MEDICAL HISTORY: Past Medical History:  Diagnosis Date   History of chicken pox    History of pneumonia 11/19/2000   staph   Hypertension    Hypertriglyceridemia    Lyme disease 05/20/2015   Nephrolithiasis 07/21/2015   with R hydronephrosis that resolved   Shingles 04/2021   L flank   Solitary pulmonary nodule on lung CT 09/21/2015   4mm RLL, no f/u needed     PAST SURGICAL HISTORY: Past Surgical History:  Procedure Laterality Date   COLONOSCOPY WITH PROPOFOL N/A 09/25/2023   Procedure: COLONOSCOPY WITH PROPOFOL;  Surgeon: Wyline Mood, MD;  Location: [facility] ENDOSCOPY;  Service: Gastroenterology;  Laterality: N/A;   US ECHOCARDIOGRAPHY  2002   WNL per paper chart, EF >55%    FAMILY HISTORY: Family History  Problem Relation Age of Onset   Hypertension Mother    Hyperlipidemia Neg Hx    Diabetes Neg Hx    Coronary artery disease Neg Hx    Stroke Neg Hx    Cancer Maternal Grandfather 58       prostate   Prostate cancer Maternal Uncle    Nephrolithiasis Paternal Uncle     HEALTH MAINTENANCE: Social History   Tobacco Use    Smoking status: Never   Smokeless tobacco: Never  Vaping Use   Vaping status: Never Used  Substance Use Topics   Alcohol use: No   Drug use: No     Allergies  Allergen Reactions   Ibuprofen     Blood in stool    Current Outpatient Medications  Medication Sig Dispense Refill   amLODipine-benazepril (LOTREL) 10-20 MG capsule TAKE 1 CAPSULE BY MOUTH EVERY DAY 90 capsule 4   Cholecalciferol (VITAMIN D3) 50 MCG (2000 UT) CAPS Take 1 capsule (2,000 Units total) by mouth daily. 30 capsule    eplerenone (INSPRA) 50 MG tablet Take 1 tablet (50 mg total) by mouth daily. 90 tablet 3   metoprolol succinate (TOPROL-XL) 100 MG 24 hr tablet TAKE 1 TABLET BY MOUTH DAILY. TAKE WITH OR IMMEDIATELY FOLLOWING A MEAL. 90 tablet 4   No current facility-administered medications for this visit.    OBJECTIVE: Vitals:   09/30/23 1033  BP: (!) 140/92  Pulse: 80  Temp: (!) 96.9 F (36.1 C)  SpO2: 100%     Body mass index is 27.98 kg/m.      General: Well-developed, well-nourished, no acute distress. Eyes: Pink conjunctiva, anicteric sclera. HEENT: Normocephalic, moist mucous membranes, clear oropharnyx. Lungs: Clear to auscultation bilaterally. Heart: Regular rate and rhythm. No rubs, murmurs, or gallops. Abdomen: Soft, nontender, nondistended. No organomegaly noted, normoactive bowel sounds. Musculoskeletal: No edema, cyanosis, or clubbing.  Neuro: Alert, answering all questions appropriately. Cranial nerves grossly intact. Skin: No rashes or petechiae noted. Psych: Normal affect. Lymphatics: No cervical, calvicular, axillary or inguinal LAD.   LAB RESULTS:  Lab Results  Component Value Date   NA 136 09/16/2023   K 4.1 09/16/2023   CL 101 09/16/2023   CO2 25 09/16/2023   GLUCOSE 102 (H) 09/16/2023   BUN 17 09/16/2023   CREATININE 1.26 (H) 09/16/2023   CALCIUM 9.4 09/16/2023   PROT 9.4 (H) 09/16/2023   ALBUMIN 4.4 09/16/2023   AST 24 09/16/2023   ALT 28 09/16/2023   ALKPHOS 72  09/16/2023   BILITOT 0.8 09/16/2023   GFRNONAA >60 09/16/2023   GFRAA 53 (L) 07/20/2015    Lab Results  Component Value Date   WBC 5.7 09/16/2023   NEUTROABS 3.2 09/16/2023   HGB 17.1 (H) 09/16/2023   HCT 48.4 09/16/2023   MCV 87.1 09/16/2023   PLT 261 09/16/2023    No results found for: "TIBC", "FERRITIN", "IRONPCTSAT"   STUDIES: No results found.  ASSESSMENT AND PLAN:   Shawn Vaughn is a 52 y.o. 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  For M spike 1.5 g/dL, non- IgG protein and normal kappa lambda ratio.  Absolute risk of progression to multiple myeloma 20 years is 37%.  -Recent labs reviewed from 09/16/2023.  M spike 1.7 (previously 1.3, 1.5), IgA 2444 (previously 2182), kappa 20.1, lambda 31.2 with a ratio of 0.64.  No anemia.  Creatinine is stable (CKD since 2014. Stable less likely to be related to paraproteinemia).  No hypercalcemia.  Discussed with the patient and his mother that overall the labs are looking stable.  Will need to continue with monitoring every 6 months.  He is agreeable with the plan.  He will come in 2 weeks prior for lab appointment.  # CKD stage II -Since 2014. Attributed likely secondary to chronic hypertension.  - Follows with Dr. Cherylann Ratel.  No albuminuria noted.  Cr is stable.  # Hypertension -On HCTZ, metoprolol, Lotrel   Orders Placed This Encounter  Procedures   CBC with Differential/Platelet   Comprehensive metabolic panel   Kappa/lambda light chains   Multiple Myeloma Panel (SPEP&IFE w/QIG)   RTC in 6 months for MD visit, labs 10 days prior.  Patient expressed understanding and was in agreement with this plan. He also understands that He can call clinic at any time with any questions, concerns, or complaints.   I spent a total  of 25 minutes reviewing chart data, face-to-face evaluation with the patient, counseling and coordination of care as detailed above.  Michaelyn Barter, MD   09/30/2023 11:45 AM

## 2023-10-09 ENCOUNTER — Ambulatory Visit: Payer: BC Managed Care – PPO | Admitting: Internal Medicine

## 2023-10-11 ENCOUNTER — Ambulatory Visit (INDEPENDENT_AMBULATORY_CARE_PROVIDER_SITE_OTHER): Payer: BC Managed Care – PPO | Admitting: Internal Medicine

## 2023-10-11 ENCOUNTER — Encounter: Payer: Self-pay | Admitting: Internal Medicine

## 2023-10-11 VITALS — BP 126/74 | HR 72 | Ht 68.0 in | Wt 188.0 lb

## 2023-10-11 DIAGNOSIS — E269 Hyperaldosteronism, unspecified: Secondary | ICD-10-CM | POA: Diagnosis not present

## 2023-10-11 DIAGNOSIS — I159 Secondary hypertension, unspecified: Secondary | ICD-10-CM

## 2023-10-11 MED ORDER — AMLODIPINE BESY-BENAZEPRIL HCL 5-10 MG PO CAPS: 1.0000 | ORAL_CAPSULE | Freq: Every day | ORAL | 3 refills | Status: DC

## 2023-10-11 MED ORDER — METOPROLOL SUCCINATE ER 100 MG PO TB24: 100.0000 mg | ORAL_TABLET | Freq: Every day | ORAL | 4 refills | Status: DC

## 2023-10-11 NOTE — Progress Notes (Signed)
Name: Shawn Vaughn  MRN/ DOB: [id number], [birth year]    Age/ Sex: 52 y.o., male    PCP: Shawn Boyden, MD   Reason for Endocrinology Evaluation: HTN     Date of Initial Endocrinology Evaluation: 10/11/2023     HPI: Shawn Vaughn is a 52 y.o. male with a past medical history of HTN, Lyme disease, nephrolithiasis, and dyslipidemia. The patient presented for initial endocrinology clinic visit on 10/11/2023 for consultative assistance with his HTN.     In reviewing his chart, patient has been noted with elevated Aldo/PRA ratio and aldosterone level of 12 mg/DL, suppressed renin 7.42 NG/mL/H on 02/06/2022   At the time the patient was on antihypertensive medications to include amlodipine-benazepril, and Toprol-XL   Patient was evaluated by [facility] on 03/29/2022 for evaluation of CKD III  and refractory HTN.  HCTZ was added at the time  Repeat labs confirmed elevated Aldo/PRA ratio at 127.3 with aldosterone level of 14 NG/DL , suppressed renin 5.95 NG/mL/H ,  at the time he was noted with elevated a.m. cortisol at 26.7 mcg/DL (reference 6-38)   CT imaging of the abdomen 05/28/2022 showed normal adrenal glands    His last appointment with nephrology was on 03/21/2023 with improvement of his GFR.  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   Has been Dx with HTN ~ 2014    Mother with HTN  SUBJECTIVE:    Today (10/11/23):  Shawn Vaughn is here for a follow up on hyperaldosteronism.     Patient continues to follow-up with oncology for MGUS He has not been checking at home  Denies headaches Denies vision changes  Denies chest pain nor sob  Denies palpitations  Denies LE edema  Denies constipation or diarrhea    HOME ENDOCRINE MEDICATIONS: Eplerenone 50 mg daily Continue metoprolol 100 mg XL daily Continue Lotrel 10-20 mg daily   HISTORY:  Past Medical History:  Past Medical History:  Diagnosis Date    History of chicken pox    History of pneumonia 11/19/2000   staph   Hypertension    Hypertriglyceridemia    Lyme disease 05/20/2015   Nephrolithiasis 07/21/2015   with R hydronephrosis that resolved   Shingles 04/2021   L flank   Solitary pulmonary nodule on lung CT 09/21/2015   4mm RLL, no f/u needed    Past Surgical History:  Past Surgical History:  Procedure Laterality Date   COLONOSCOPY WITH PROPOFOL N/A 09/25/2023   Procedure: COLONOSCOPY WITH PROPOFOL;  Surgeon: Shawn Mood, MD;  Location: [facility] ENDOSCOPY;  Service: Gastroenterology;  Laterality: N/A;   US ECHOCARDIOGRAPHY  2002   WNL per paper chart, EF >55%    Social History:  reports that he has never smoked. He has never used smokeless tobacco. He reports that he does not drink alcohol and does not use drugs. Family History: family history includes Cancer (age of onset: 101) in his maternal grandfather; Hypertension in his mother; Nephrolithiasis in his paternal uncle; Prostate cancer in his maternal uncle.   HOME MEDICATIONS: Allergies as of 10/11/2023       Reactions   Ibuprofen    Blood in stool        Medication List        Accurate as of October 11, 2023  1:47 PM. If you have any questions, ask your nurse or doctor.          STOP taking these medications    amLODipine-benazepril 10-20  MG capsule Commonly known as: LOTREL Replaced by: amLODipine-benazepril 5-10 MG capsule Stopped by: Shawn Vaughn Shawn Vaughn       TAKE these medications    amLODipine-benazepril 5-10 MG capsule Commonly known as: Lotrel Take 1 capsule by mouth daily. Replaces: amLODipine-benazepril 10-20 MG capsule Started by: Shawn Vaughn Shawn Vaughn   eplerenone 50 MG tablet Commonly known as: INSPRA Take 1 tablet (50 mg total) by mouth daily.   metoprolol succinate 100 MG 24 hr tablet Commonly known as: TOPROL-XL Take 1 tablet (100 mg total) by mouth daily. What changed: additional instructions Changed by: Shawn Vaughn  Shawn Vaughn   Na Sulfate-K Sulfate-Mg Sulf 17.5-3.13-1.6 GM/177ML Soln See admin instructions.   Vitamin D3 50 MCG (2000 UT) capsule Take 1 capsule (2,000 Units total) by mouth daily.          REVIEW OF SYSTEMS: A comprehensive ROS was conducted with the patient and is negative except as per HPI  OBJECTIVE:  VS: BP 126/74 (BP Location: Left Arm, Patient Position: Sitting, Cuff Size: Small)   Pulse 72   Ht 5\' 8"  (1.727 m)   Wt 188 lb (85.3 kg)   SpO2 96%   BMI 28.59 kg/m    Wt Readings from Last 3 Encounters:  10/11/23 188 lb (85.3 kg)  09/30/23 184 lb (83.5 kg)  09/25/23 176 lb (79.8 kg)     EXAM: General: Pt appears well and is in NAD  Neck: General: Supple without adenopathy. Thyroid: Thyroid size normal.  No goiter or nodules appreciated.  Lungs: Clear with good BS bilat   Heart: Auscultation: RRR.  Abdomen: Soft, nontender  Extremities:  BL LE: No pretibial edema   Mental Status: Judgment, insight: Intact Orientation: Oriented to time, place, and person Vaughn and affect: No depression, anxiety, or agitation     DATA REVIEWED:   Latest Reference Range & Units 09/16/23 08:09  Sodium 135 - 145 mmol/L 136  Potassium 3.5 - 5.1 mmol/L 4.1  Chloride 98 - 111 mmol/L 101  CO2 22 - 32 mmol/L 25  Glucose 70 - 99 mg/dL 956 (H)  BUN 6 - 20 mg/dL 17  Creatinine 2.13 - 0.86 mg/dL 5.78 (H)  Calcium 8.9 - 10.3 mg/dL 9.4  Anion gap 5 - 15  10  Alkaline Phosphatase 38 - 126 U/L 72  Albumin 3.5 - 5.0 g/dL 4.4  AST 15 - 41 U/L 24  ALT 0 - 44 U/L 28  Total Protein 6.5 - 8.1 g/dL 9.4 (H)  Total Bilirubin 0.3 - 1.2 mg/dL 0.8  GFR, Estimated >46 mL/min >60  (H): Data is abnormally high   Latest Reference Range & Units Most Recent  Renin Activity 0.25 - 5.82 ng/mL/h 0.11 (L) 01/28/23 07:31      Old records , labs and images have been reviewed.     ASSESSMENT/PLAN/RECOMMENDATIONS:   Hyperaldosteronism :   - Pt with hyperaldosteronism, NO need for confirmation  testing as his renin is suppressed in the setting for ACEi  -  We discussed confirmation testing with adrenal venous sampling for localization purposes. -The patient is NOT  interested in invasive testing at this time, we opted to start eplerenone for medical management -BP well-controlled, will decrease Lotrel as below -Renin pending -Creatinine slightly elevated, encourage hydration  Medications : Eplerenone 50 mg daily Continue metoprolol 100 mg XL daily Change Lotrel 5-10  mg daily   Follow-up in 6 months  Signed electronically by: Lyndle Herrlich, MD   Endocrinology  [facility] [id number]  [address] [place], [place] [address] Phone: [phone] FAX: [phone]   CC: Shawn Boyden, MD [address] [place] [address] Phone: [phone] Fax: [phone]   Return to Endocrinology clinic as below: Future Appointments  Date Time Provider Department Center  03/16/2024  8:00 AM [facility] None  03/26/2024 10:45 AM Michaelyn Barter, MD [facility] None  03/27/2024  8:10 AM Ferd Horrigan, Konrad Dolores, MD [facility] None

## 2023-10-11 NOTE — Patient Instructions (Signed)
Continue  Epleronone 50 mg daily   Continue Metoprolol 100 mg daily  Change Lotrel 5-10 mg, 1 tablet daily

## 2023-10-22 LAB — RENIN: Renin Activity: 0.26 ng/mL/h (ref 0.25–5.82)

## 2023-11-14 ENCOUNTER — Other Ambulatory Visit: Payer: Self-pay | Admitting: Family Medicine

## 2023-11-14 DIAGNOSIS — S29012A Strain of muscle and tendon of back wall of thorax, initial encounter: Secondary | ICD-10-CM

## 2023-11-15 NOTE — Telephone Encounter (Signed)
Pt notified us at 07/30/23 OV that he no longer needs cyclobenzaprine.   Spoke with pt asking if he restarted med above. Pt states he restarted med saying he is finished with it. Recommended he contact [facility] to notify them, in case it's on auto-refill. Pt verbalizes understanding.   Request denied.

## 2023-12-19 ENCOUNTER — Ambulatory Visit: Payer: BC Managed Care – PPO | Admitting: Internal Medicine

## 2023-12-19 ENCOUNTER — Other Ambulatory Visit: Payer: BC Managed Care – PPO

## 2024-03-16 ENCOUNTER — Inpatient Hospital Stay: Payer: BC Managed Care – PPO | Attending: Internal Medicine

## 2024-03-16 DIAGNOSIS — E785 Hyperlipidemia, unspecified: Secondary | ICD-10-CM | POA: Diagnosis not present

## 2024-03-16 DIAGNOSIS — D472 Monoclonal gammopathy: Secondary | ICD-10-CM | POA: Insufficient documentation

## 2024-03-16 DIAGNOSIS — Z79899 Other long term (current) drug therapy: Secondary | ICD-10-CM | POA: Insufficient documentation

## 2024-03-16 DIAGNOSIS — N182 Chronic kidney disease, stage 2 (mild): Secondary | ICD-10-CM | POA: Diagnosis not present

## 2024-03-16 DIAGNOSIS — I129 Hypertensive chronic kidney disease with stage 1 through stage 4 chronic kidney disease, or unspecified chronic kidney disease: Secondary | ICD-10-CM | POA: Diagnosis not present

## 2024-03-16 LAB — CBC WITH DIFFERENTIAL/PLATELET
Abs Immature Granulocytes: 0.01 10*3/uL (ref 0.00–0.07)
Basophils Absolute: 0 10*3/uL (ref 0.0–0.1)
Basophils Relative: 1 %
Eosinophils Absolute: 0.2 10*3/uL (ref 0.0–0.5)
Eosinophils Relative: 3 %
HCT: 49 % (ref 39.0–52.0)
Hemoglobin: 17 g/dL (ref 13.0–17.0)
Immature Granulocytes: 0 %
Lymphocytes Relative: 36 %
Lymphs Abs: 2 10*3/uL (ref 0.7–4.0)
MCH: 30.5 pg (ref 26.0–34.0)
MCHC: 34.7 g/dL (ref 30.0–36.0)
MCV: 87.8 fL (ref 80.0–100.0)
Monocytes Absolute: 0.5 10*3/uL (ref 0.1–1.0)
Monocytes Relative: 9 %
Neutro Abs: 2.8 10*3/uL (ref 1.7–7.7)
Neutrophils Relative %: 51 %
Platelets: 256 10*3/uL (ref 150–400)
RBC: 5.58 MIL/uL (ref 4.22–5.81)
RDW: 13.3 % (ref 11.5–15.5)
WBC: 5.4 10*3/uL (ref 4.0–10.5)
nRBC: 0 % (ref 0.0–0.2)

## 2024-03-16 LAB — COMPREHENSIVE METABOLIC PANEL WITH GFR
ALT: 22 U/L (ref 0–44)
AST: 21 U/L (ref 15–41)
Albumin: 4.1 g/dL (ref 3.5–5.0)
Alkaline Phosphatase: 65 U/L (ref 38–126)
Anion gap: 11 (ref 5–15)
BUN: 18 mg/dL (ref 6–20)
CO2: 23 mmol/L (ref 22–32)
Calcium: 9.2 mg/dL (ref 8.9–10.3)
Chloride: 101 mmol/L (ref 98–111)
Creatinine, Ser: 1.2 mg/dL (ref 0.61–1.24)
GFR, Estimated: >60 mL/min (ref >60)
Glucose, Bld: 95 mg/dL (ref 70–99)
Potassium: 3.9 mmol/L (ref 3.5–5.1)
Sodium: 135 mmol/L (ref 135–145)
Total Bilirubin: 0.7 mg/dL (ref 0.0–1.2)
Total Protein: 8.4 g/dL — ABNORMAL HIGH (ref 6.5–8.1)

## 2024-03-17 LAB — KAPPA/LAMBDA LIGHT CHAINS
Kappa free light chain: 18.6 mg/L (ref 3.3–19.4)
Kappa, lambda light chain ratio: 0.59 (ref 0.26–1.65)
Lambda free light chains: 31.5 mg/L — ABNORMAL HIGH (ref 5.7–26.3)

## 2024-03-19 LAB — MULTIPLE MYELOMA PANEL, SERUM
Albumin SerPl Elph-Mcnc: 3.8 g/dL (ref 2.9–4.4)
Albumin/Glob SerPl: 0.9 (ref 0.7–1.7)
Alpha 1: 0.2 g/dL (ref 0.0–0.4)
Alpha2 Glob SerPl Elph-Mcnc: 0.6 g/dL (ref 0.4–1.0)
B-Globulin SerPl Elph-Mcnc: 1.1 g/dL (ref 0.7–1.3)
Gamma Glob SerPl Elph-Mcnc: 2.5 g/dL — ABNORMAL HIGH (ref 0.4–1.8)
Globulin, Total: 4.4 g/dL — ABNORMAL HIGH (ref 2.2–3.9)
IgA: 2195 mg/dL — ABNORMAL HIGH (ref 90–386)
IgG (Immunoglobin G), Serum: 1169 mg/dL (ref 603–1613)
IgM (Immunoglobulin M), Srm: 99 mg/dL (ref 20–172)
M Protein SerPl Elph-Mcnc: 1.4 g/dL — ABNORMAL HIGH
Total Protein ELP: 8.2 g/dL (ref 6.0–8.5)

## 2024-03-26 ENCOUNTER — Telehealth: Payer: Self-pay | Admitting: *Deleted

## 2024-03-26 ENCOUNTER — Inpatient Hospital Stay: Payer: BC Managed Care – PPO | Attending: Internal Medicine | Admitting: Internal Medicine

## 2024-03-26 ENCOUNTER — Encounter: Payer: Self-pay | Admitting: Internal Medicine

## 2024-03-26 ENCOUNTER — Ambulatory Visit: Payer: BC Managed Care – PPO | Admitting: Internal Medicine

## 2024-03-26 VITALS — BP 140/88 | HR 81 | Temp 97.9°F | Resp 18 | Wt 183.0 lb

## 2024-03-26 DIAGNOSIS — Z886 Allergy status to analgesic agent status: Secondary | ICD-10-CM | POA: Diagnosis not present

## 2024-03-26 DIAGNOSIS — Z8249 Family history of ischemic heart disease and other diseases of the circulatory system: Secondary | ICD-10-CM | POA: Insufficient documentation

## 2024-03-26 DIAGNOSIS — N182 Chronic kidney disease, stage 2 (mild): Secondary | ICD-10-CM | POA: Insufficient documentation

## 2024-03-26 DIAGNOSIS — E781 Pure hyperglyceridemia: Secondary | ICD-10-CM | POA: Insufficient documentation

## 2024-03-26 DIAGNOSIS — Z8042 Family history of malignant neoplasm of prostate: Secondary | ICD-10-CM | POA: Diagnosis not present

## 2024-03-26 DIAGNOSIS — Z87442 Personal history of urinary calculi: Secondary | ICD-10-CM | POA: Diagnosis not present

## 2024-03-26 DIAGNOSIS — D472 Monoclonal gammopathy: Secondary | ICD-10-CM | POA: Diagnosis present

## 2024-03-26 DIAGNOSIS — Z842 Family history of other diseases of the genitourinary system: Secondary | ICD-10-CM | POA: Insufficient documentation

## 2024-03-26 DIAGNOSIS — I129 Hypertensive chronic kidney disease with stage 1 through stage 4 chronic kidney disease, or unspecified chronic kidney disease: Secondary | ICD-10-CM | POA: Diagnosis not present

## 2024-03-26 DIAGNOSIS — Z79899 Other long term (current) drug therapy: Secondary | ICD-10-CM | POA: Insufficient documentation

## 2024-03-26 NOTE — Progress Notes (Addendum)
 [facility]  Telephone:([phone] [phone]  ID: Shawn Vaughn OB: [birth year]  MR#: [id number]  MWN#:[id number]  Patient Care Team: Claire Crick, MD as PCP - General (Family Medicine)  HPI: Shawn Vaughn is a 53 y.o. 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  Patient reports that he was found to have kidney disease sometime in July 2023.  Recently, creatinine has normalized.  No anemia.  Interval history Patient was seen today as follow-up to discuss labs for IgA lambda MGUS. Patient has been doing well overall.  Denies any concerns today.  REVIEW OF SYSTEMS:   ROS  As per HPI. Otherwise, a complete review of systems is negative.  PAST MEDICAL HISTORY: Past Medical History:  Diagnosis Date   History of chicken pox    History of pneumonia 11/19/2000   staph   Hypertension    Hypertriglyceridemia    Lyme disease 05/20/2015   Nephrolithiasis 07/21/2015   with R hydronephrosis that resolved   Shingles 04/2021   L flank   Solitary pulmonary nodule on lung CT 09/21/2015   4mm RLL, no f/u needed     PAST SURGICAL HISTORY: Past Surgical History:  Procedure Laterality Date   COLONOSCOPY WITH PROPOFOL  N/A 09/25/2023   Procedure: COLONOSCOPY WITH PROPOFOL ;  Surgeon: Luke Salaam, MD;  Location: [facility] ENDOSCOPY;  Service: Gastroenterology;  Laterality: N/A;   US  ECHOCARDIOGRAPHY  2002   WNL per paper chart, EF >55%    FAMILY HISTORY: Family History  Problem Relation Age of Onset   Hypertension Mother    Hyperlipidemia Neg Hx    Diabetes Neg Hx    Coronary artery disease Neg Hx    Stroke Neg Hx    Cancer Maternal Grandfather 73       prostate   Prostate cancer Maternal Uncle    Nephrolithiasis Paternal Uncle     HEALTH MAINTENANCE: Social History   Tobacco Use   Smoking status: Never   Smokeless tobacco: Never  Vaping Use   Vaping status:  Never Used  Substance Use Topics   Alcohol use: No   Drug use: No     Allergies  Allergen Reactions   Ibuprofen     Blood in stool    Current Outpatient Medications  Medication Sig Dispense Refill   amLODipine -benazepril  (LOTREL) 5-10 MG capsule Take 1 capsule by mouth daily. 90 capsule 3   eplerenone  (INSPRA ) 50 MG tablet Take 1 tablet (50 mg total) by mouth daily. 90 tablet 3   metoprolol  succinate (TOPROL -XL) 100 MG 24 hr tablet Take 1 tablet (100 mg total) by mouth daily. 90 tablet 4   Cholecalciferol  (VITAMIN D3) 50 MCG (2000 UT) CAPS Take 1 capsule (2,000 Units total) by mouth daily. (Patient not taking: Reported on 03/26/2024) 30 capsule    Na Sulfate-K Sulfate-Mg Sulf 17.5-3.13-1.6 GM/177ML SOLN See admin instructions. (Patient not taking: Reported on 03/26/2024)     No current facility-administered medications for this visit.    OBJECTIVE: Vitals:   03/26/24 1048  BP: (!) 140/88  Pulse: 81  Resp: 18  Temp: 97.9 F (36.6 C)  SpO2: 100%     Body mass index is 27.83 kg/m.      General: Well-developed, well-nourished, no acute distress. Eyes: Pink conjunctiva, anicteric sclera. HEENT: Normocephalic, moist mucous membranes, clear oropharnyx. Lungs: Clear to auscultation bilaterally. Heart: Regular rate and rhythm. No rubs, murmurs, or gallops. Abdomen: Soft, nontender, nondistended. No organomegaly noted, normoactive  bowel sounds. Musculoskeletal: No edema, cyanosis, or clubbing. Neuro: Alert, answering all questions appropriately. Cranial nerves grossly intact. Skin: No rashes or petechiae noted. Psych: Normal affect. Lymphatics: No cervical, calvicular, axillary or inguinal LAD.   LAB RESULTS:  Lab Results  Component Value Date   NA 135 03/16/2024   K 3.9 03/16/2024   CL 101 03/16/2024   CO2 23 03/16/2024   GLUCOSE 95 03/16/2024   BUN 18 03/16/2024   CREATININE 1.20 03/16/2024   CALCIUM  9.2 03/16/2024   PROT 8.4 (H) 03/16/2024   ALBUMIN 4.1 03/16/2024    AST 21 03/16/2024   ALT 22 03/16/2024   ALKPHOS 65 03/16/2024   BILITOT 0.7 03/16/2024   GFRNONAA >60 03/16/2024   GFRAA 53 (L) 07/20/2015    Lab Results  Component Value Date   WBC 5.4 03/16/2024   NEUTROABS 2.8 03/16/2024   HGB 17.0 03/16/2024   HCT 49.0 03/16/2024   MCV 87.8 03/16/2024   PLT 256 03/16/2024    No results found for: "TIBC", "FERRITIN", "IRONPCTSAT"   STUDIES: No results found.  ASSESSMENT AND PLAN:   Shawn Vaughn is a 53 y.o. 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 Based on [facility] risk stratification-he falls in low intermediate risk.  For M spike 1.5 g/dL, non- IgG protein and normal kappa lambda ratio.  Absolute risk of progression to multiple myeloma 20 years is 20%.  -Recent labs reviewed from April 2025.  M spike 1.4, kappa lambda ratio is normal.  Creatinine normal.  No anemia or hypercalcemia.  Overall his labs are stable however with IgA paraproteinemia, per [facility] guidelines, offered bone marrow biopsy to assess for clonal plasma cells.  Also briefly discussed about new AQUILA study with use of daratumumab for 3 years in high risk smoldering myeloma which has shown to improve PFS. It is not FDA approved yet.  However if he does have clonal plasma cell more than 10% on biopsy, may be eligible down the line once approved.  Patient was agreeable to proceed.  Will schedule and then follow-up with MD to discuss biopsy results.  If no concerns on biopsy, will continue monitoring labs every 6 to 12 months.    # CKD stage II -Since 2014. Attributed likely secondary to chronic hypertension.  - Follows with Dr. Rhesa Celeste.  No albuminuria noted.  Cr is stable.  # Hypertension -On HCTZ, metoprolol , Lotrel   Orders Placed This Encounter  Procedures   CT BONE MARROW BIOPSY & ASPIRATION   RTC in 6  weeks for MD visit, discuss bone marrow biopsy results.  Patient expressed understanding and was in agreement with this plan. He also understands that He can call clinic at any time with any questions, concerns, or complaints.   I spent a total of 25 minutes reviewing chart data, face-to-face evaluation with the patient, counseling and coordination of care as detailed above.  Loreatha Rodney, MD   03/26/2024 1:29 PM

## 2024-03-26 NOTE — Telephone Encounter (Signed)
 Patient requires a bone marrow bx. Msg sent to [facility] in IR scheduling to arrange.

## 2024-03-26 NOTE — Progress Notes (Signed)
 Patient has been doing good no new questions or concerns for the doctor today. He told me that his kidney function has improved since he has been seeing the kidney specialist.

## 2024-03-26 NOTE — Telephone Encounter (Signed)
 RN Contacted patient regarding Bone marrow biopsy dates. Apts arranged for Wed 5/21 at 8:30a an arrive at 7:30a . Pt confirmed apt. Pt instructed to not eat/drink anything after midnight and to bring a driver to the apt. Pt should report to to the heart/vascular location at [facility] for the procedure.

## 2024-03-27 ENCOUNTER — Ambulatory Visit (INDEPENDENT_AMBULATORY_CARE_PROVIDER_SITE_OTHER): Payer: BC Managed Care – PPO | Admitting: Internal Medicine

## 2024-03-27 ENCOUNTER — Encounter: Payer: Self-pay | Admitting: Internal Medicine

## 2024-03-27 VITALS — BP 130/70 | HR 70 | Ht 68.0 in | Wt 182.6 lb

## 2024-03-27 DIAGNOSIS — I159 Secondary hypertension, unspecified: Secondary | ICD-10-CM | POA: Diagnosis not present

## 2024-03-27 DIAGNOSIS — E269 Hyperaldosteronism, unspecified: Secondary | ICD-10-CM | POA: Diagnosis not present

## 2024-03-27 MED ORDER — EPLERENONE 50 MG PO TABS: 50.0000 mg | ORAL_TABLET | Freq: Two times a day (BID) | ORAL | 3 refills | Status: AC

## 2024-03-27 MED ORDER — AMLODIPINE BESY-BENAZEPRIL HCL 5-10 MG PO CAPS: 1.0000 | ORAL_CAPSULE | Freq: Every day | ORAL | 3 refills | Status: AC

## 2024-03-27 MED ORDER — METOPROLOL SUCCINATE ER 100 MG PO TB24: 100.0000 mg | ORAL_TABLET | Freq: Every day | ORAL | 4 refills | Status: AC

## 2024-03-27 NOTE — Progress Notes (Signed)
 Name: Shawn Vaughn  MRN/ DOB: [id number], 1971-08-24    Age/ Sex: 53 y.o., male    PCP: Claire Crick, MD   Reason for Endocrinology Evaluation: HTN     Date of Initial Endocrinology Evaluation: 03/27/2024     HPI: Shawn Vaughn is a 53 y.o. male with a past medical history of HTN, Lyme disease, nephrolithiasis, and dyslipidemia. The patient presented for initial endocrinology clinic visit on 03/27/2024 for consultative assistance with his HTN.     In reviewing his chart, patient has been noted with elevated Aldo/PRA ratio and aldosterone level of 12 mg/DL, suppressed renin 2.95 NG/mL/H on 02/06/2022   At the time the patient was on antihypertensive medications to include amlodipine -benazepril , and Toprol -XL   Patient was evaluated by [facility] on 03/29/2022 for evaluation of CKD III  and refractory HTN.  HCTZ was added at the time  Repeat labs confirmed elevated Aldo/PRA ratio at 127.3 with aldosterone level of 14 NG/DL , suppressed renin 1.88 NG/mL/H ,  at the time he was noted with elevated a.m. cortisol at 26.7 mcg/DL (reference 4-16)   CT imaging of the abdomen 05/28/2022 showed normal adrenal glands  His last appointment with nephrology was on 03/21/2023 with improvement of his GFR.  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   Has been Dx with HTN ~ 2014    Mother with HTN  SUBJECTIVE:    Today (03/27/24):  Shawn Vaughn is here for a follow up on hyperaldosteronism.    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 Rhesa Celeste) for CKD  Has been checking BP at home ~ 130/80   Denies headaches Denies vision changes  Denies chest pain nor sob  Denies palpitations  Denies LE edema  Denies constipation or diarrhea  Denies dizziness   Has an upcoming  DOT   HOME ENDOCRINE MEDICATIONS: Eplerenone  50 mg daily Metoprolol  100 mg XL daily Lotrel 5-10  mg daily   HISTORY:  Past Medical History:  Past Medical History:  Diagnosis Date   History of chicken pox    History of pneumonia 11/19/2000   staph   Hypertension    Hypertriglyceridemia    Lyme disease 05/20/2015   Nephrolithiasis 07/21/2015   with R hydronephrosis that resolved   Shingles 04/2021   L flank   Solitary pulmonary nodule on lung CT 09/21/2015   4mm RLL, no f/u needed    Past Surgical History:  Past Surgical History:  Procedure Laterality Date   COLONOSCOPY WITH PROPOFOL  N/A 09/25/2023   Procedure: COLONOSCOPY WITH PROPOFOL ;  Surgeon: Luke Salaam, MD;  Location: [facility] ENDOSCOPY;  Service: Gastroenterology;  Laterality: N/A;   US  ECHOCARDIOGRAPHY  2002   WNL per paper chart, EF >55%    Social History:  reports that he has never smoked. He has never used smokeless tobacco. He reports that he does not drink alcohol and does not use drugs. Family History: family history includes Cancer (age of onset: 36) in his maternal grandfather; Hypertension in his mother; Nephrolithiasis in his paternal uncle; Prostate cancer in his maternal uncle.   HOME MEDICATIONS: Allergies as of 03/27/2024       Reactions   Ibuprofen    Blood in stool        Medication List        Accurate as of Mar 27, 2024  8:10 AM. If you have any questions, ask your nurse or  doctor.          amLODipine -benazepril  5-10 MG capsule Commonly known as: Lotrel Take 1 capsule by mouth daily.   ascorbic acid  500 MG tablet Commonly known as: VITAMIN C Take 500 mg by mouth daily.   eplerenone  50 MG tablet Commonly known as: INSPRA  Take 1 tablet (50 mg total) by mouth daily.   metoprolol  succinate 100 MG 24 hr tablet Commonly known as: TOPROL -XL Take 1 tablet (100 mg total) by mouth daily.   Na Sulfate-K Sulfate-Mg Sulfate concentrate 17.5-3.13-1.6 GM/177ML Soln Commonly known as: SUPREP See admin instructions.   Vitamin D3 50 MCG (2000 UT) capsule Take 1 capsule (2,000 Units total)  by mouth daily.          REVIEW OF SYSTEMS: A comprehensive ROS was conducted with the patient and is negative except as per HPI  OBJECTIVE:  VS: BP 130/70 (BP Location: Left Arm, Patient Position: Sitting, Cuff Size: Normal)   Pulse 70   Ht 5\' 8"  (1.727 m)   Wt 182 lb 9.6 oz (82.8 kg)   SpO2 98%   BMI 27.76 kg/m    Wt Readings from Last 3 Encounters:  03/27/24 182 lb 9.6 oz (82.8 kg)  03/26/24 183 lb (83 kg)  10/11/23 188 lb (85.3 kg)     EXAM: General: Pt appears well and is in NAD  Neck: General: Supple without adenopathy. Thyroid : Thyroid  size normal.  No goiter or nodules appreciated.  Lungs: Clear with good BS bilat   Heart: Auscultation: RRR.  Abdomen: Soft, nontender  Extremities:  BL LE: No pretibial edema   Mental Status: Judgment, insight: Intact Orientation: Oriented to time, place, and person Mood and affect: No depression, anxiety, or agitation     DATA REVIEWED:   Latest Reference Range & Units 03/16/24 07:59  Sodium 135 - 145 mmol/L 135  Potassium 3.5 - 5.1 mmol/L 3.9  Chloride 98 - 111 mmol/L 101  CO2 22 - 32 mmol/L 23  Glucose 70 - 99 mg/dL 95  BUN 6 - 20 mg/dL 18  Creatinine 8.65 - 7.84 mg/dL 6.96  Calcium  8.9 - 10.3 mg/dL 9.2  Anion gap 5 - 15  11  Alkaline Phosphatase 38 - 126 U/L 65  Albumin 3.5 - 5.0 g/dL 4.1  AST 15 - 41 U/L 21  ALT 0 - 44 U/L 22  Total Protein 6.5 - 8.1 g/dL 8.4 (H)  Total Bilirubin 0.0 - 1.2 mg/dL 0.7  GFR, Estimated >29 mL/min >60  (H): Data is abnormally high   Latest Reference Range & Units Most Recent  Renin Activity 0.25 - 5.82 ng/mL/h 0.11 (L) 01/28/23 07:31      Old records , labs and images have been reviewed.     ASSESSMENT/PLAN/RECOMMENDATIONS:   Hyperaldosteronism :   - Pt with hyperaldosteronism, NO need for confirmation testing as his renin is suppressed in the setting for ACEi intake  -  We discussed localization testing with adrenal venous sampling -The patient is NOT  interested  in invasive testing at this time, we opted to start eplerenone  for medical management -BP well-controlled, on today's visit, but his potassium level has trended down -I will increase eplerenone  as below - Patient will return in 1 month for repeat labs to check on GFR and potassium  Medications : Increase eplerenone  50 mg twice daily Continue metoprolol  100 mg XL daily Continue Lotrel 5-10  mg daily   Follow-up in 6 months  Signed electronically by: Natale Bail, MD  [facility]  Endocrinology  [facility] [address] [place], [place] [address] Phone: [phone] FAX: [phone]   CC: Claire Crick, MD [address] [place] [address] Phone: [phone] Fax: [phone]   Return to Endocrinology clinic as below: Future Appointments  Date Time Provider Department Center  04/08/2024  8:30 AM [facility] RM 4 [facility]  04/17/2024  7:30 AM Claire Crick, MD [facility]  05/06/2024 10:00 AM Timmy Forbes, MD [facility] None

## 2024-03-27 NOTE — Patient Instructions (Addendum)
 Increase Epleronone 50 mg twice daily  Continue Metoprolol  100 mg daily  Change Lotrel 5-10 mg, 1 tablet daily

## 2024-04-07 ENCOUNTER — Other Ambulatory Visit: Payer: Self-pay | Admitting: Radiology

## 2024-04-07 DIAGNOSIS — D472 Monoclonal gammopathy: Secondary | ICD-10-CM

## 2024-04-07 NOTE — Progress Notes (Signed)
 Patient for IR Bone Marrow Biopsy on Wed 04/08/24, I called and spoke with the patient on the phone and gave pre-procedure instructions. Pt was made aware to be here at 7:30a, NPO after MN prior to procedure as well as driver post procedure/recovery/discharge. Pt stated understanding.  Called 04/07/24

## 2024-04-08 ENCOUNTER — Other Ambulatory Visit: Payer: Self-pay

## 2024-04-08 ENCOUNTER — Encounter: Payer: Self-pay | Admitting: Radiology

## 2024-04-08 ENCOUNTER — Ambulatory Visit
Admission: RE | Admit: 2024-04-08 | Discharge: 2024-04-08 | Disposition: A | Discharge status: Home / Self Care | Source: Ambulatory Visit | Attending: Internal Medicine | Admitting: Internal Medicine

## 2024-04-08 DIAGNOSIS — D472 Monoclonal gammopathy: Secondary | ICD-10-CM | POA: Diagnosis present

## 2024-04-08 HISTORY — PX: IR BONE MARROW BIOPSY & ASPIRATION: IMG5727

## 2024-04-08 LAB — CBC WITH DIFFERENTIAL/PLATELET
Abs Immature Granulocytes: 0.01 K/uL (ref 0.00–0.07)
Basophils Absolute: 0 K/uL (ref 0.0–0.1)
Basophils Relative: 1 %
Eosinophils Absolute: 0.2 K/uL (ref 0.0–0.5)
Eosinophils Relative: 3 %
HCT: 49.6 % (ref 39.0–52.0)
Hemoglobin: 16.9 g/dL (ref 13.0–17.0)
Immature Granulocytes: 0 %
Lymphocytes Relative: 40 %
Lymphs Abs: 2.7 K/uL (ref 0.7–4.0)
MCH: 30 pg (ref 26.0–34.0)
MCHC: 34.1 g/dL (ref 30.0–36.0)
MCV: 87.9 fL (ref 80.0–100.0)
Monocytes Absolute: 0.6 K/uL (ref 0.1–1.0)
Monocytes Relative: 8 %
Neutro Abs: 3.4 K/uL (ref 1.7–7.7)
Neutrophils Relative %: 48 %
Platelets: 271 K/uL (ref 150–400)
RBC: 5.64 MIL/uL (ref 4.22–5.81)
RDW: 13.6 % (ref 11.5–15.5)
WBC: 6.9 K/uL (ref 4.0–10.5)
nRBC: 0 % (ref 0.0–0.2)

## 2024-04-08 MED ORDER — SODIUM CHLORIDE 0.9 % IV SOLN: INTRAVENOUS | Status: DC

## 2024-04-08 MED ORDER — HEPARIN SOD (PORK) LOCK FLUSH 100 UNIT/ML IV SOLN
INTRAVENOUS | Status: AC
Start: 2024-04-08 — End: ?
  Filled 2024-04-08: qty 5

## 2024-04-08 MED ORDER — MIDAZOLAM HCL 2 MG/2ML IJ SOLN
INTRAMUSCULAR | Status: AC
  Filled 2024-04-08: qty 2

## 2024-04-08 MED ORDER — FENTANYL CITRATE (PF) 100 MCG/2ML IJ SOLN
INTRAMUSCULAR | Status: AC
  Filled 2024-04-08: qty 2

## 2024-04-08 MED ORDER — FENTANYL CITRATE (PF) 100 MCG/2ML IJ SOLN
INTRAMUSCULAR | Status: AC | PRN
  Administered 2024-04-08 (×2): 50 ug via INTRAVENOUS

## 2024-04-08 MED ORDER — MIDAZOLAM HCL 2 MG/2ML IJ SOLN
INTRAMUSCULAR | Status: AC | PRN
  Administered 2024-04-08 (×2): 1 mg via INTRAVENOUS

## 2024-04-08 NOTE — H&P (Signed)
 Chief Complaint: Patient was seen in consultation today for MGUS   Procedure: Bone Marrow Biopsy with Aspiration  Referring Physician(s): Agrawal,Kavita  Supervising Physician: Elene Griffes  Patient Status: [facility] - Out-pt  History of Present Illness: Shawn Vaughn is a 53 y.o. male with a history of IgA lambda MGUS being followed by Dr. Marchia Seton (heme/onc). Patient was initially diagnosed in 03/2022 as low-intermediate risk. Overall, his most recent labs in 02/2024 were stable, however he was noted to have IgA paraproteinemia. Per guidelines, patient was offered bone marrow biopsy to assess for clonal plasma cells and progression to multiple myeloma. Patient elected to move forward with biopsy and was subsequently referred to IR.   He is currently resting in bed and is without complaints. He denies any fevers/chills, chest pain, shortness of breath, headache, or light headedness. NPO since midnight. Labs WNL. VSS.   Code Status: Full Code  Past Medical History:  Diagnosis Date   History of chicken pox    History of pneumonia 11/19/2000   staph   Hypertension    Hypertriglyceridemia    Lyme disease 05/20/2015   Nephrolithiasis 07/21/2015   with R hydronephrosis that resolved   Shingles 04/2021   L flank   Solitary pulmonary nodule on lung CT 09/21/2015   4mm RLL, no f/u needed     Past Surgical History:  Procedure Laterality Date   COLONOSCOPY WITH PROPOFOL  N/A 09/25/2023   Procedure: COLONOSCOPY WITH PROPOFOL ;  Surgeon: Luke Salaam, MD;  Location: [facility] ENDOSCOPY;  Service: Gastroenterology;  Laterality: N/A;   US  ECHOCARDIOGRAPHY  2002   WNL per paper chart, EF >55%    Allergies: Ibuprofen  Medications: Prior to Admission medications   Medication Sig Start Date End Date Taking? Authorizing Provider  amLODipine -benazepril  (LOTREL) 5-10 MG capsule Take 1 capsule by mouth daily. 03/27/24  Yes Shamleffer, Ibtehal Jaralla, MD  ascorbic acid  (VITAMIN C) 500 MG tablet  Take 500 mg by mouth daily.   Yes [provider]  Cholecalciferol  (VITAMIN D3) 50 MCG (2000 UT) CAPS Take 1 capsule (2,000 Units total) by mouth daily. 02/06/23  Yes Claire Crick, MD  eplerenone  (INSPRA ) 50 MG tablet Take 1 tablet (50 mg total) by mouth 2 (two) times daily. 03/27/24  Yes Shamleffer, Ibtehal Jaralla, MD  metoprolol  succinate (TOPROL -XL) 100 MG 24 hr tablet Take 1 tablet (100 mg total) by mouth daily. 03/27/24  Yes Shamleffer, Julian Obey, MD  Na Sulfate-K Sulfate-Mg Sulf 17.5-3.13-1.6 GM/177ML SOLN See admin instructions. 08/09/23   [provider]     Family History  Problem Relation Age of Onset   Hypertension Mother    Hyperlipidemia Neg Hx    Diabetes Neg Hx    Coronary artery disease Neg Hx    Stroke Neg Hx    Cancer Maternal Grandfather 32       prostate   Prostate cancer Maternal Uncle    Nephrolithiasis Paternal Uncle     Social History   Socioeconomic History   Marital status: Single    Spouse name: Not on file   Number of children: Not on file   Years of education: Not on file   Highest education level: Not on file  Occupational History   Not on file  Tobacco Use   Smoking status: Never   Smokeless tobacco: Never  Vaping Use   Vaping status: Never Used  Substance and Sexual Activity   Alcohol use: No   Drug use: No   Sexual activity: Not Currently  Birth control/protection: None  Other Topics Concern   Not on file  Social History Narrative   Caffeine: occasional sodas   Lives with mom, and dad but has his own place, 4 dogs   Occupation: works at [employer]   Edu: HS   Activity: hunts 3x/wk   Diet: good water, fruits/vegetables daily   Social Drivers of Corporate investment banker Strain: Not on file  Food Insecurity: No Food Insecurity (12/17/2022)   Hunger Vital Sign    Worried About Running Out of Food in the Last Year: Never true    Ran Out of Food in the Last Year: Never true  Transportation Needs: No  Transportation Needs (12/17/2022)   PRAPARE - Administrator, Civil Service (Medical): No    Lack of Transportation (Non-Medical): No  Physical Activity: Not on file  Stress: Not on file  Social Connections: Unknown (05/17/2022)   Received from [facility]   Social Network    Social Network: Not on file    Review of Systems Denies any N/V, chest pain, shortness of breath, fevers/chills. All other ROS negative.  Vital Signs: BP (!) 144/94   Pulse 74   Temp 98.2 F (36.8 C) (Oral)   Resp 15   SpO2 100%     Physical Exam Vitals reviewed.  Constitutional:      Appearance: Normal appearance.  HENT:     Head: Normocephalic and atraumatic.     Mouth/Throat:     Mouth: Mucous membranes are moist.     Pharynx: Oropharynx is clear.  Cardiovascular:     Rate and Rhythm: Normal rate and regular rhythm.  Pulmonary:     Effort: Pulmonary effort is normal.     Breath sounds: Normal breath sounds.  Abdominal:     General: Abdomen is flat.     Palpations: Abdomen is soft.     Tenderness: There is no abdominal tenderness.  Musculoskeletal:        General: Normal range of motion.     Cervical back: Normal range of motion.  Skin:    General: Skin is warm and dry.  Neurological:     General: No focal deficit present.     Mental Status: He is alert and oriented to person, place, and time. Mental status is at baseline.  Psychiatric:        Mood and Affect: Mood normal.        Behavior: Behavior normal.        Judgment: Judgment normal.     Imaging: No results found.  Labs:  CBC: Recent Labs    09/16/23 0809 03/16/24 0759  WBC 5.7 5.4  HGB 17.1* 17.0  HCT 48.4 49.0  PLT 261 256    COAGS: No results for input(s): "INR", "APTT" in the last 8760 hours.  BMP: Recent Labs    05/28/23 0852 07/30/23 0834 09/16/23 0809 03/16/24 0759  NA 138 137 136 135  K 4.0 4.1 4.1 3.9  CL 102 102 101 101  CO2 28 28 25 23   GLUCOSE 96 88 102* 95  BUN  18 18 17 18   CALCIUM  9.8 9.5 9.4 9.2  CREATININE 1.28 1.16 1.26* 1.20  GFRNONAA  --   --  >60 >60    LIVER FUNCTION TESTS: Recent Labs    07/30/23 0834 09/16/23 0809 03/16/24 0759  BILITOT  --  0.8 0.7  AST  --  24 21  ALT  --  28 22  ALKPHOS  --  72 65  PROT  --  9.4* 8.4*  ALBUMIN 4.1 4.4 4.1    TUMOR MARKERS: No results for input(s): "AFPTM", "CEA", "CA199", "CHROMGRNA" in the last 8760 hours.  Assessment and Plan:  IgA lambda MGUS:  Shawn Vaughn is a 53 y.o. male with a history of IgA lambda MGUS diagnosed in 2023 who presents to [facility] Interventional Radiology department for an image-guided bone marrow biopsy with aspiration with Dr. Irine Manning on 04/08/24. Procedure to be performed under moderate sedation.  -NPO since midnight -Labs WNL and VSS -Plan for bone marrow biopsy with Dr. Irine Manning  Risks and benefits of bone marrow biopsy was discussed with the patient and/or patient's family including, but not limited to bleeding, infection, damage to adjacent structures or low yield requiring additional tests.  All of the questions were answered and there is agreement to proceed.  Consent signed and in chart.   Thank you for this interesting consult. I greatly enjoyed meeting Shawn Vaughn and look forward to participating in their care. A copy of this report was sent to the requesting provider on this date.  Electronically Signed: Orson Blalock, PA-C 04/08/2024, 8:21 AM   I spent a total of  15 Minutes   in face to face clinical consultation, greater than 50% of which was counseling/coordinating care for bone marrow biopsy.

## 2024-04-08 NOTE — Progress Notes (Signed)
 Patient clinically stable post IR BMB per Dr Julietta Ogren, tolerated well. Vitals stable pre and post procedure. Received Versed  2 mg along with Fentanyl 100 mcg IV for procedure. Report given to [facility] RN post procedure/specials/17

## 2024-04-08 NOTE — Procedures (Signed)
 Interventional Radiology Procedure:   Indications:  MGUS  Procedure: Fluoroscopic guided bone marrow biopsy  Findings: 2 aspirates and 1 core from right ilium  Complications: None     EBL: Minimal, less than 10 ml  Plan: Discharge to home in one hour.   Dariush Mcnellis R. Julietta Ogren, MD  Pager: [phone]

## 2024-04-10 LAB — SURGICAL PATHOLOGY

## 2024-04-15 ENCOUNTER — Encounter (HOSPITAL_COMMUNITY): Payer: Self-pay | Admitting: Internal Medicine

## 2024-04-17 ENCOUNTER — Encounter: Payer: Self-pay | Admitting: Family Medicine

## 2024-04-17 ENCOUNTER — Ambulatory Visit: Admitting: Family Medicine

## 2024-04-17 VITALS — BP 140/88 | HR 93 | Temp 97.9°F | Ht 67.5 in | Wt 182.1 lb

## 2024-04-17 DIAGNOSIS — E559 Vitamin D deficiency, unspecified: Secondary | ICD-10-CM

## 2024-04-17 DIAGNOSIS — Z23 Encounter for immunization: Secondary | ICD-10-CM | POA: Diagnosis not present

## 2024-04-17 DIAGNOSIS — E781 Pure hyperglyceridemia: Secondary | ICD-10-CM

## 2024-04-17 DIAGNOSIS — N182 Chronic kidney disease, stage 2 (mild): Secondary | ICD-10-CM | POA: Diagnosis not present

## 2024-04-17 DIAGNOSIS — E269 Hyperaldosteronism, unspecified: Secondary | ICD-10-CM | POA: Diagnosis not present

## 2024-04-17 DIAGNOSIS — Z0001 Encounter for general adult medical examination with abnormal findings: Secondary | ICD-10-CM | POA: Diagnosis not present

## 2024-04-17 DIAGNOSIS — Z125 Encounter for screening for malignant neoplasm of prostate: Secondary | ICD-10-CM | POA: Diagnosis not present

## 2024-04-17 DIAGNOSIS — D472 Monoclonal gammopathy: Secondary | ICD-10-CM

## 2024-04-17 DIAGNOSIS — K5790 Diverticulosis of intestine, part unspecified, without perforation or abscess without bleeding: Secondary | ICD-10-CM

## 2024-04-17 DIAGNOSIS — I159 Secondary hypertension, unspecified: Secondary | ICD-10-CM

## 2024-04-17 LAB — COMPREHENSIVE METABOLIC PANEL WITH GFR
ALT: 22 U/L (ref 0–53)
AST: 21 U/L (ref 0–37)
Albumin: 4.4 g/dL (ref 3.5–5.2)
Alkaline Phosphatase: 71 U/L (ref 39–117)
BUN: 21 mg/dL (ref 6–23)
CO2: 29 meq/L (ref 19–32)
Calcium: 9.7 mg/dL (ref 8.4–10.5)
Chloride: 100 meq/L (ref 96–112)
Creatinine, Ser: 1.33 mg/dL (ref 0.40–1.50)
GFR: 61.92 mL/min (ref >60.00)
Glucose, Bld: 109 mg/dL — ABNORMAL HIGH (ref 70–99)
Potassium: 4.5 meq/L (ref 3.5–5.1)
Sodium: 138 meq/L (ref 135–145)
Total Bilirubin: 0.8 mg/dL (ref 0.2–1.2)
Total Protein: 8.4 g/dL — ABNORMAL HIGH (ref 6.0–8.3)

## 2024-04-17 LAB — LIPID PANEL
Cholesterol: 124 mg/dL (ref 0–200)
HDL: 32 mg/dL — ABNORMAL LOW (ref >39.00)
LDL Cholesterol: 13 mg/dL (ref 0–99)
NonHDL: 92.33
Total CHOL/HDL Ratio: 4
Triglycerides: 397 mg/dL — ABNORMAL HIGH (ref 0.0–149.0)
VLDL: 79.4 mg/dL — ABNORMAL HIGH (ref 0.0–40.0)

## 2024-04-17 LAB — PSA: PSA: 0.64 ng/mL (ref 0.10–4.00)

## 2024-04-17 LAB — VITAMIN D 25 HYDROXY (VIT D DEFICIENCY, FRACTURES): VITD: 35.53 ng/mL (ref 30.00–100.00)

## 2024-04-17 NOTE — Assessment & Plan Note (Signed)
 Followed by endo.

## 2024-04-17 NOTE — Patient Instructions (Addendum)
 Labwork today  Update Tdap today  Consider shingrix shots.  Good to see you today  Return as needed or in 1 year for next physical  Continue monitoring BP at home

## 2024-04-17 NOTE — Assessment & Plan Note (Addendum)
 Appreciate heme/onc care. Recent bone marrow biopsy with hypercellularity and 11% plasma cells, polyclonal, heme f/u pending next month.

## 2024-04-17 NOTE — Assessment & Plan Note (Signed)
 Chronic, overall stable period. Appreciate renal, endo care.

## 2024-04-17 NOTE — Progress Notes (Signed)
 Ph: [phone] Fax: [phone]   Patient ID: Shawn Vaughn, male    DOB: 05-16-1971, 53 y.o.   MRN: [id number]  This visit was conducted in person.  BP (!) 140/88 (BP Location: Right Arm, Cuff Size: Large)   Pulse 93   Temp 97.9 F (36.6 C) (Oral)   Ht 5' 7.5" (1.715 m)   Wt 182 lb 2 oz (82.6 kg)   SpO2 98%   BMI 28.10 kg/m    CC: 8 mo f/u visit - CPE Subjective:   HPI: Shawn Vaughn is a 53 y.o. 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   Home BP readings run well controlled to low - latest check 130/70s. Anticipate component of white coat HTN. Currently on lotrel, eplerenone , toprol  XL.    Preventative: Colonoscopy 09/2023 - diverticulosis, o/w WNL Antony Baumgartner) Prostate cancer screening - does have fmhx prostate cancer maternal grandfather age 48s. Continue yearly PSA  Lung cancer screening - not eligible  Flu shot yearly, declines this year COVID vaccine Pfizer 06/2020, 07/2020, Moderna booster 06/2021 Tdap 2014, update today  Shingrix - discussed Seat belt use discussed.  Sunscreen use discussed. No changing moles on skin.  Sleep - averaging 8 hours/night Non smoker  Alcohol - none  Dentist q6 mo  Eye exam - yearly  Bowel - no constipation    Caffeine: occasional sodas   Lives with mom, dad but has his own place, 4 dogs   Occupation: [employer] driver Edu: [employer]   Activity: hunts with dogs 3x/wk, tries to walk 1 mi/day  Diet: good water, fruits/vegetables daily      Relevant past medical, surgical, family and social history reviewed and updated as indicated. Interim medical history since our last visit reviewed. Allergies and medications reviewed and updated. Outpatient Medications Prior to Visit  Medication Sig  Dispense Refill   amLODipine -benazepril  (LOTREL) 5-10 MG capsule Take 1 capsule by mouth daily. 90 capsule 3   ascorbic acid  (VITAMIN C) 500 MG tablet Take 500 mg by mouth daily.     Cholecalciferol  (VITAMIN D3) 50 MCG (2000 UT) CAPS Take 1 capsule (2,000 Units total) by mouth daily. 30 capsule    eplerenone  (INSPRA ) 50 MG tablet Take 1 tablet (50 mg total) by mouth 2 (two) times daily. 180 tablet 3   metoprolol  succinate (TOPROL -XL) 100 MG 24 hr tablet Take 1 tablet (100 mg total) by mouth daily. 90 tablet 4   Na Sulfate-K Sulfate-Mg Sulf 17.5-3.13-1.6 GM/177ML SOLN See admin instructions. (Patient not taking: Reported on 04/17/2024)     No facility-administered medications prior to visit.     Per HPI unless specifically indicated in ROS section below Review of Systems  Constitutional:  Negative for activity change, appetite change, chills, fatigue, fever and unexpected weight change.  HENT:  Negative for hearing loss.   Eyes:  Negative for visual disturbance.  Respiratory:  Negative for cough, chest tightness, shortness of breath and wheezing.   Cardiovascular:  Negative for chest pain, palpitations and leg swelling.  Gastrointestinal:  Negative for abdominal distention, abdominal pain, blood in stool, constipation, diarrhea, nausea and vomiting.  Genitourinary:  Negative for difficulty urinating and hematuria.  Musculoskeletal:  Negative for arthralgias, myalgias and neck pain.  Skin:  Negative for rash.  Neurological:  Negative for  dizziness, seizures, syncope and headaches.  Hematological:  Negative for adenopathy. Does not bruise/bleed easily.  Psychiatric/Behavioral:  Negative for dysphoric mood. The patient is not nervous/anxious.     Objective:  BP (!) 140/88 (BP Location: Right Arm, Cuff Size: Large)   Pulse 93   Temp 97.9 F (36.6 C) (Oral)   Ht 5' 7.5" (1.715 m)   Wt 182 lb 2 oz (82.6 kg)   SpO2 98%   BMI 28.10 kg/m   Wt Readings from Last 3 Encounters:  04/17/24 182  lb 2 oz (82.6 kg)  03/27/24 182 lb 9.6 oz (82.8 kg)  03/26/24 183 lb (83 kg)      Physical Exam Vitals and nursing note reviewed.  Constitutional:      General: He is not in acute distress.    Appearance: Normal appearance. He is well-developed. He is not ill-appearing.  HENT:     Head: Normocephalic and atraumatic.     Right Ear: Hearing, tympanic membrane, ear canal and external ear normal.     Left Ear: Hearing, tympanic membrane, ear canal and external ear normal.     Mouth/Throat:     Mouth: Mucous membranes are moist.     Pharynx: Oropharynx is clear. No oropharyngeal exudate or posterior oropharyngeal erythema.  Eyes:     General: No scleral icterus.    Extraocular Movements: Extraocular movements intact.     Conjunctiva/sclera: Conjunctivae normal.     Pupils: Pupils are equal, round, and reactive to light.  Neck:     Thyroid : No thyroid  mass or thyromegaly.  Cardiovascular:     Rate and Rhythm: Normal rate and regular rhythm.     Pulses: Normal pulses.          Radial pulses are 2+ on the right side and 2+ on the left side.     Heart sounds: Normal heart sounds. No murmur heard. Pulmonary:     Effort: Pulmonary effort is normal. No respiratory distress.     Breath sounds: Normal breath sounds. No wheezing, rhonchi or rales.  Abdominal:     General: Bowel sounds are normal. There is no distension.     Palpations: Abdomen is soft. There is no mass.     Tenderness: There is no abdominal tenderness. There is no guarding or rebound.     Hernia: No hernia is present.  Musculoskeletal:        General: Normal range of motion.     Cervical back: Normal range of motion and neck supple.     Right lower leg: No edema.     Left lower leg: No edema.  Lymphadenopathy:     Cervical: No cervical adenopathy.  Skin:    General: Skin is warm and dry.     Findings: No rash.  Neurological:     General: No focal deficit present.     Mental Status: He is alert and oriented to  person, place, and time.  Psychiatric:        Mood and Affect: Mood normal.        Behavior: Behavior normal.        Thought Content: Thought content normal.        Judgment: Judgment normal.        Assessment & Plan:   Problem List Items Addressed This Visit     Encounter for general adult medical examination with abnormal findings - Primary (Chronic)   Preventative protocols reviewed and updated unless pt declined. Discussed healthy diet and lifestyle.  Hypertension, secondary   Chronic, overall stable period. Appreciate renal, endo care.       Hypertriglyceridemia   Update labs today       Relevant Orders   Lipid panel   Comprehensive metabolic panel with GFR   CKD (chronic kidney disease) stage 2, GFR 60-89 ml/min   Appreciate renal care - most recent GFR stable 70s      Vitamin D  deficiency   Update labs on 2000 units daily.       Relevant Orders   VITAMIN D  25 Hydroxy (Vit-D Deficiency, Fractures)   Hyperaldosteronism (HCC)   Followed by endo.       Diverticulosis   Discussed with patient.       MGUS (monoclonal gammopathy of unknown significance)   Appreciate heme/onc care. Recent bone marrow biopsy with hypercellularity and 11% plasma cells, polyclonal, heme f/u pending next month.       Other Visit Diagnoses       Special screening for malignant neoplasm of prostate       Relevant Orders   PSA     Need for Tdap vaccination       Relevant Orders   Tdap vaccine greater than or equal to 7yo IM (Completed)        No orders of the defined types were placed in this encounter.   Orders Placed This Encounter  Procedures   Tdap vaccine greater than or equal to 7yo IM   Lipid panel   Comprehensive metabolic panel with GFR   PSA   VITAMIN D  25 Hydroxy (Vit-D Deficiency, Fractures)    Patient Instructions  Labwork today  Update Tdap today  Consider shingrix shots.  Good to see you today  Return as needed or in 1 year for next  physical  Continue monitoring BP at home  Follow up plan: Return in about 1 year (around 04/17/2025) for annual exam, prior fasting for blood work.  Claire Crick, MD

## 2024-04-17 NOTE — Assessment & Plan Note (Signed)
 Preventative protocols reviewed and updated unless pt declined. Discussed healthy diet and lifestyle.

## 2024-04-17 NOTE — Assessment & Plan Note (Signed)
 Discussed with patient

## 2024-04-17 NOTE — Assessment & Plan Note (Signed)
 Appreciate renal care - most recent GFR stable 70s

## 2024-04-17 NOTE — Assessment & Plan Note (Signed)
 Update labs on 2000 units daily.

## 2024-04-17 NOTE — Assessment & Plan Note (Signed)
 Update labs today

## 2024-04-20 ENCOUNTER — Encounter (HOSPITAL_COMMUNITY): Payer: Self-pay | Admitting: Internal Medicine

## 2024-04-20 ENCOUNTER — Encounter: Payer: Self-pay | Admitting: Oncology

## 2024-04-22 ENCOUNTER — Ambulatory Visit: Payer: Self-pay | Admitting: Family Medicine

## 2024-04-24 ENCOUNTER — Ambulatory Visit: Admitting: Oncology

## 2024-04-29 ENCOUNTER — Inpatient Hospital Stay: Attending: Internal Medicine | Admitting: Oncology

## 2024-04-29 ENCOUNTER — Encounter: Payer: Self-pay | Admitting: Oncology

## 2024-04-29 ENCOUNTER — Other Ambulatory Visit

## 2024-04-29 VITALS — BP 133/92 | HR 88 | Temp 96.5°F | Resp 18 | Wt 185.2 lb

## 2024-04-29 DIAGNOSIS — D472 Monoclonal gammopathy: Secondary | ICD-10-CM | POA: Insufficient documentation

## 2024-04-29 DIAGNOSIS — I10 Essential (primary) hypertension: Secondary | ICD-10-CM | POA: Insufficient documentation

## 2024-04-29 DIAGNOSIS — Z886 Allergy status to analgesic agent status: Secondary | ICD-10-CM | POA: Diagnosis not present

## 2024-04-29 DIAGNOSIS — Z79899 Other long term (current) drug therapy: Secondary | ICD-10-CM | POA: Diagnosis not present

## 2024-04-29 DIAGNOSIS — Z8249 Family history of ischemic heart disease and other diseases of the circulatory system: Secondary | ICD-10-CM | POA: Diagnosis not present

## 2024-04-29 DIAGNOSIS — E781 Pure hyperglyceridemia: Secondary | ICD-10-CM | POA: Diagnosis not present

## 2024-04-29 NOTE — Progress Notes (Signed)
 Hematology/Oncology Progress note [phone] [phone]     DOB: 08/06/1971  MR#: [id number]  ZDG#:[id number]  Patient Care Team: Claire Crick, MD as PCP - General (Family Medicine) Timmy Forbes, MD as Consulting Physician (Oncology)   ASSESSMENT & PLAN:   MGUS (monoclonal gammopathy of unknown significance) IgA Lamda MGUS M spike 1.5g/dl. normal light chain ratio.  Currently no anemia, renal function impairment, hypercalcemia.   04/08/2024 Bone marrow biopsy showed  BONE MARROW, ASPIRATE, CLOT, CORE:  - Mildly hypercellular bone marrow (70%) with increased plasma cells.  PERIPHERAL BLOOD:  - No significant pathologic changes   COMMENT:  Morphologic evaluation reveals a mildly hypercellular bone marrow with slightly increased plasma cells (approximately 11% by manual aspirate differential and up to 10% by CD138 immunohistochemical analysis). Kappa/lambda ISH reveal a polyclonal pattern with slight lambda excess but do not reveal any evidence of clonality.    normal cytogenetics. Normal myeloma FISH  Diagnosis of active multiple myeloma is not met.  Recommend continue observation, repeat myeloma labs every 6 months.  Check skeletal survey.    Orders Placed This Encounter  Procedures   DG Bone Survey Met    Standing Status:   Future    Expected Date:   05/06/2024    Expiration Date:   04/29/2025    Reason for Exam (SYMPTOM  OR DIAGNOSIS REQUIRED):   MGUS    Preferred imaging location?:   [facility]   CBC with Differential (Cancer Center Only)    Standing Status:   Future    Expected Date:   08/29/2024    Expiration Date:   04/29/2025   CMP (Cancer Center only)    Standing Status:   Future    Expected Date:   08/29/2024    Expiration Date:   04/29/2025   Kappa/lambda light chains    Standing Status:   Future    Expected Date:   08/29/2024    Expiration Date:   04/29/2025   Multiple Myeloma Panel (SPEP&IFE w/QIG)    Standing Status:   Future     Expected Date:   08/29/2024    Expiration Date:   04/29/2025   Beta 2 microglobulin, serum    Standing Status:   Future    Expected Date:   08/29/2024    Expiration Date:   04/29/2025   Miscellaneous [facility] test (send-out)    Standing Status:   Future    Expected Date:   08/29/2024    Expiration Date:   04/29/2025    Test name / description::   NT-pro BNP test # [id number]   Follow up in Oct 2025 All questions were answered. The patient knows to call the clinic with any problems, questions or concerns.  Timmy Forbes, MD, PhD [facility] Hematology Oncology 04/29/2024   HPI: Shawn Vaughn is a 53 y.o. 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 Patient previously followed up with Dr.Agrawal. Patient switched care to me on 04/29/1024 Extensive medical record review was performed by me Patient reports that he was found to have kidney disease sometime in July 2023.  Recently, creatinine has normalized.  No anemia.  Interval history Patient was seen today as follow-up of IgA lambda MGUS. Patient has been doing well overall.  Denies any concerns today. S/p Bone marrow biopsy    PAST MEDICAL HISTORY: Past Medical History:  Diagnosis Date   History of chicken pox    History of pneumonia 11/19/2000  staph   Hypertension    Hypertriglyceridemia    Lyme disease 05/20/2015   Nephrolithiasis 07/21/2015   with R hydronephrosis that resolved   Shingles 04/2021   L flank   Solitary pulmonary nodule on lung CT 09/21/2015   4mm RLL, no f/u needed     PAST SURGICAL HISTORY: Past Surgical History:  Procedure Laterality Date   COLONOSCOPY WITH PROPOFOL  N/A 09/25/2023   diverticulosis, WNL Antony Baumgartner)   IR BONE MARROW BIOPSY & ASPIRATION  04/08/2024   US  ECHOCARDIOGRAPHY  11/19/2000   WNL per paper chart, EF >55%    Family History  Problem Relation Age of Onset   Hypertension Mother    Hyperlipidemia Neg Hx     Diabetes Neg Hx    Coronary artery disease Neg Hx    Stroke Neg Hx    Cancer Maternal Grandfather 30       prostate   Prostate cancer Maternal Uncle    Nephrolithiasis Paternal Uncle     Social History   Tobacco Use   Smoking status: Never   Smokeless tobacco: Never  Vaping Use   Vaping status: Never Used  Substance Use Topics   Alcohol use: No   Drug use: No     Allergies  Allergen Reactions   Ibuprofen     Blood in stool    Current Outpatient Medications  Medication Sig Dispense Refill   amLODipine -benazepril  (LOTREL) 5-10 MG capsule Take 1 capsule by mouth daily. 90 capsule 3   ascorbic acid  (VITAMIN C) 500 MG tablet Take 500 mg by mouth daily.     Cholecalciferol  (VITAMIN D3) 50 MCG (2000 UT) CAPS Take 1 capsule (2,000 Units total) by mouth daily. 30 capsule    eplerenone  (INSPRA ) 50 MG tablet Take 1 tablet (50 mg total) by mouth 2 (two) times daily. 180 tablet 3   metoprolol  succinate (TOPROL -XL) 100 MG 24 hr tablet Take 1 tablet (100 mg total) by mouth daily. 90 tablet 4   No current facility-administered medications for this visit.    OBJECTIVE: Vitals:   04/29/24 1051 04/29/24 1101  BP: (!) 158/100 (!) 133/92  Pulse: 88   Resp: 18   Temp: (!) 96.5 F (35.8 C)   SpO2: 100%      Body mass index is 28.58 kg/m.      Physical Exam Constitutional:      General: He is not in acute distress. HENT:     Head: Normocephalic and atraumatic.  Eyes:     General: No scleral icterus. Cardiovascular:     Rate and Rhythm: Normal rate and regular rhythm.  Pulmonary:     Effort: Pulmonary effort is normal. No respiratory distress.     Breath sounds: No wheezing.  Abdominal:     General: Bowel sounds are normal. There is no distension.     Palpations: Abdomen is soft.  Musculoskeletal:        General: No deformity. Normal range of motion.     Cervical back: Normal range of motion and neck supple.  Skin:    General: Skin is warm and dry.     Findings: No  erythema or rash.  Neurological:     Mental Status: He is alert and oriented to person, place, and time. Mental status is at baseline.  Psychiatric:        Mood and Affect: Mood normal.      Lab Results  Component Value Date   NA 138 04/17/2024   K 4.5 04/17/2024  CL 100 04/17/2024   CO2 29 04/17/2024   GLUCOSE 109 (H) 04/17/2024   BUN 21 04/17/2024   CREATININE 1.33 04/17/2024   CALCIUM  9.7 04/17/2024   PROT 8.4 (H) 04/17/2024   ALBUMIN 4.4 04/17/2024   AST 21 04/17/2024   ALT 22 04/17/2024   ALKPHOS 71 04/17/2024   BILITOT 0.8 04/17/2024   GFRNONAA >60 03/16/2024   GFRAA 53 (L) 07/20/2015    Lab Results  Component Value Date   WBC 6.9 04/08/2024   NEUTROABS 3.4 04/08/2024   HGB 16.9 04/08/2024   HCT 49.6 04/08/2024   MCV 87.9 04/08/2024   PLT 271 04/08/2024    No results found for: TIBC, FERRITIN, IRONPCTSAT   STUDIES: IR BONE MARROW BIOPSY & ASPIRATION Result Date: 04/08/2024 INDICATION: 53 year old with IgA MGUS. EXAM: FLUOROSCOPIC GUIDED BONE MARROW ASPIRATES AND BIOPSY Physician: Olive Better. Henn, MD MEDICATIONS: Moderate sedation ANESTHESIA/SEDATION: Moderate (conscious) sedation was employed during this procedure. A total of Versed  2 mg and fentanyl  100 mcg was administered intravenously at the order of the provider performing the procedure. Total intra-service moderate sedation time:  . Patient's level of consciousness and vital signs were monitored continuously by radiology nurse throughout the procedure under the supervision of the provider performing the procedure. FLUOROSCOPY: Radiation Exposure Index (as provided by the fluoroscopic device): 6 mGy Kerma COMPLICATIONS: None immediate. PROCEDURE: The procedure was explained to the patient. The risks and benefits of the procedure were discussed and the patient's questions were addressed. Informed consent was obtained from the patient. The patient was placed prone on interventional table. The back was  prepped and draped in sterile fashion. Maximal barrier sterile technique was utilized including caps, mask, sterile gowns, sterile gloves, sterile drape, hand hygiene and skin antiseptic. The skin and right posterior ilium were anesthetized with 1% lidocaine. 11 gauge bone needle was directed into the right ilium with fluoroscopic guidance. Two aspirates and 1 core biopsy were obtained. Bandage placed over the puncture site. Fluoroscopic image saved for documentation. IMPRESSION: Fluoroscopic guided bone marrow aspiration and core biopsy. Electronically Signed   By: Elene Griffes M.D.   On: 04/08/2024 10:29

## 2024-04-29 NOTE — Assessment & Plan Note (Addendum)
 IgA Lamda MGUS M spike 1.5g/dl. normal light chain ratio.  Currently no anemia, renal function impairment, hypercalcemia.   04/08/2024 Bone marrow biopsy showed  BONE MARROW, ASPIRATE, CLOT, CORE:  - Mildly hypercellular bone marrow (70%) with increased plasma cells.  PERIPHERAL BLOOD:  - No significant pathologic changes   COMMENT:  Morphologic evaluation reveals a mildly hypercellular bone marrow with slightly increased plasma cells (approximately 11% by manual aspirate differential and up to 10% by CD138 immunohistochemical analysis). Kappa/lambda ISH reveal a polyclonal pattern with slight lambda excess but do not reveal any evidence of clonality.    normal cytogenetics. Normal myeloma FISH  Diagnosis of active multiple myeloma is not met.  Recommend continue observation, repeat myeloma labs every 6 months.  Check skeletal survey.

## 2024-04-30 LAB — BASIC METABOLIC PANEL WITH GFR
BUN: 19 mg/dL (ref 7–25)
CO2: 27 mmol/L (ref 20–32)
Calcium: 9.4 mg/dL (ref 8.6–10.3)
Chloride: 101 mmol/L (ref 98–110)
Creat: 1.17 mg/dL (ref 0.70–1.30)
Glucose, Bld: 100 mg/dL — ABNORMAL HIGH (ref 65–99)
Potassium: 4.3 mmol/L (ref 3.5–5.3)
Sodium: 137 mmol/L (ref 135–146)
eGFR: 75 mL/min/{1.73_m2} (ref >=60)

## 2024-05-04 ENCOUNTER — Ambulatory Visit: Payer: Self-pay | Admitting: Internal Medicine

## 2024-05-06 ENCOUNTER — Ambulatory Visit: Admitting: Oncology

## 2024-08-19 ENCOUNTER — Inpatient Hospital Stay: Attending: Oncology

## 2024-08-19 DIAGNOSIS — Z809 Family history of malignant neoplasm, unspecified: Secondary | ICD-10-CM | POA: Insufficient documentation

## 2024-08-19 DIAGNOSIS — Z83438 Family history of other disorder of lipoprotein metabolism and other lipidemia: Secondary | ICD-10-CM | POA: Diagnosis not present

## 2024-08-19 DIAGNOSIS — Z79899 Other long term (current) drug therapy: Secondary | ICD-10-CM | POA: Diagnosis not present

## 2024-08-19 DIAGNOSIS — Z8419 Family history of other disorders of kidney and ureter: Secondary | ICD-10-CM | POA: Insufficient documentation

## 2024-08-19 DIAGNOSIS — Z8042 Family history of malignant neoplasm of prostate: Secondary | ICD-10-CM | POA: Diagnosis not present

## 2024-08-19 DIAGNOSIS — D472 Monoclonal gammopathy: Secondary | ICD-10-CM | POA: Insufficient documentation

## 2024-08-19 DIAGNOSIS — E785 Hyperlipidemia, unspecified: Secondary | ICD-10-CM | POA: Diagnosis not present

## 2024-08-19 DIAGNOSIS — Z8249 Family history of ischemic heart disease and other diseases of the circulatory system: Secondary | ICD-10-CM | POA: Diagnosis not present

## 2024-08-19 DIAGNOSIS — I10 Essential (primary) hypertension: Secondary | ICD-10-CM | POA: Insufficient documentation

## 2024-08-19 LAB — CBC WITH DIFFERENTIAL (CANCER CENTER ONLY)
Abs Immature Granulocytes: 0.03 K/uL (ref 0.00–0.07)
Basophils Absolute: 0 K/uL (ref 0.0–0.1)
Basophils Relative: 1 %
Eosinophils Absolute: 0.2 K/uL (ref 0.0–0.5)
Eosinophils Relative: 4 %
HCT: 45.3 % (ref 39.0–52.0)
Hemoglobin: 16.1 g/dL (ref 13.0–17.0)
Immature Granulocytes: 1 %
Lymphocytes Relative: 43 %
Lymphs Abs: 2 K/uL (ref 0.7–4.0)
MCH: 31.1 pg (ref 26.0–34.0)
MCHC: 35.5 g/dL (ref 30.0–36.0)
MCV: 87.5 fL (ref 80.0–100.0)
Monocytes Absolute: 0.4 K/uL (ref 0.1–1.0)
Monocytes Relative: 9 %
Neutro Abs: 2 K/uL (ref 1.7–7.7)
Neutrophils Relative %: 42 %
Platelet Count: 245 K/uL (ref 150–400)
RBC: 5.18 MIL/uL (ref 4.22–5.81)
RDW: 13.1 % (ref 11.5–15.5)
WBC Count: 4.6 K/uL (ref 4.0–10.5)
nRBC: 0 % (ref 0.0–0.2)

## 2024-08-19 LAB — CMP (CANCER CENTER ONLY)
ALT: 23 U/L (ref 0–44)
AST: 26 U/L (ref 15–41)
Albumin: 4.2 g/dL (ref 3.5–5.0)
Alkaline Phosphatase: 61 U/L (ref 38–126)
Anion gap: 8 (ref 5–15)
BUN: 22 mg/dL — ABNORMAL HIGH (ref 6–20)
CO2: 24 mmol/L (ref 22–32)
Calcium: 9.2 mg/dL (ref 8.9–10.3)
Chloride: 102 mmol/L (ref 98–111)
Creatinine: 1.27 mg/dL — ABNORMAL HIGH (ref 0.61–1.24)
GFR, Estimated: >60 mL/min (ref >60)
Glucose, Bld: 106 mg/dL — ABNORMAL HIGH (ref 70–99)
Potassium: 4.3 mmol/L (ref 3.5–5.1)
Sodium: 134 mmol/L — ABNORMAL LOW (ref 135–145)
Total Bilirubin: 1.1 mg/dL (ref 0.0–1.2)
Total Protein: 8.4 g/dL — ABNORMAL HIGH (ref 6.5–8.1)

## 2024-08-20 LAB — MISC LABCORP TEST (SEND OUT): Labcorp test code: 143000

## 2024-08-20 LAB — BETA 2 MICROGLOBULIN, SERUM: Beta-2 Microglobulin: 2 mg/L (ref 0.6–2.4)

## 2024-08-20 LAB — KAPPA/LAMBDA LIGHT CHAINS
Kappa free light chain: 19 mg/L (ref 3.3–19.4)
Kappa, lambda light chain ratio: 0.54 (ref 0.26–1.65)
Lambda free light chains: 35.2 mg/L — ABNORMAL HIGH (ref 5.7–26.3)

## 2024-08-22 LAB — MULTIPLE MYELOMA PANEL, SERUM
Albumin SerPl Elph-Mcnc: 3.7 g/dL (ref 2.9–4.4)
Albumin/Glob SerPl: 0.9 (ref 0.7–1.7)
Alpha 1: 0.2 g/dL (ref 0.0–0.4)
Alpha2 Glob SerPl Elph-Mcnc: 0.5 g/dL (ref 0.4–1.0)
B-Globulin SerPl Elph-Mcnc: 1.1 g/dL (ref 0.7–1.3)
Gamma Glob SerPl Elph-Mcnc: 2.4 g/dL — ABNORMAL HIGH (ref 0.4–1.8)
Globulin, Total: 4.3 g/dL — ABNORMAL HIGH (ref 2.2–3.9)
IgA: 2259 mg/dL — ABNORMAL HIGH (ref 90–386)
IgG (Immunoglobin G), Serum: 1131 mg/dL (ref 603–1613)
IgM (Immunoglobulin M), Srm: 91 mg/dL (ref 20–172)
M Protein SerPl Elph-Mcnc: 1.7 g/dL — ABNORMAL HIGH
Total Protein ELP: 8 g/dL (ref 6.0–8.5)

## 2024-09-02 ENCOUNTER — Other Ambulatory Visit

## 2024-09-02 ENCOUNTER — Inpatient Hospital Stay: Admitting: Oncology

## 2024-09-16 ENCOUNTER — Ambulatory Visit: Admitting: Oncology

## 2024-09-28 ENCOUNTER — Inpatient Hospital Stay: Attending: Oncology | Admitting: Oncology

## 2024-09-28 ENCOUNTER — Ambulatory Visit
Admission: RE | Admit: 2024-09-28 | Discharge: 2024-09-28 | Disposition: A | Source: Ambulatory Visit | Attending: Oncology | Admitting: Oncology

## 2024-09-28 ENCOUNTER — Encounter: Payer: Self-pay | Admitting: Internal Medicine

## 2024-09-28 ENCOUNTER — Ambulatory Visit: Admitting: Internal Medicine

## 2024-09-28 ENCOUNTER — Ambulatory Visit
Admission: RE | Admit: 2024-09-28 | Discharge: 2024-09-28 | Disposition: A | Discharge status: Home / Self Care | Source: Ambulatory Visit | Attending: Oncology | Admitting: Oncology

## 2024-09-28 ENCOUNTER — Encounter: Payer: Self-pay | Admitting: Oncology

## 2024-09-28 VITALS — BP 110/90 | HR 71 | Ht 67.5 in | Wt 184.0 lb

## 2024-09-28 VITALS — BP 134/86 | HR 73 | Temp 97.3°F | Resp 18 | Wt 183.4 lb

## 2024-09-28 DIAGNOSIS — D472 Monoclonal gammopathy: Secondary | ICD-10-CM | POA: Insufficient documentation

## 2024-09-28 DIAGNOSIS — Z8249 Family history of ischemic heart disease and other diseases of the circulatory system: Secondary | ICD-10-CM | POA: Diagnosis not present

## 2024-09-28 DIAGNOSIS — Z886 Allergy status to analgesic agent status: Secondary | ICD-10-CM | POA: Diagnosis not present

## 2024-09-28 DIAGNOSIS — N182 Chronic kidney disease, stage 2 (mild): Secondary | ICD-10-CM | POA: Insufficient documentation

## 2024-09-28 DIAGNOSIS — Z8042 Family history of malignant neoplasm of prostate: Secondary | ICD-10-CM | POA: Insufficient documentation

## 2024-09-28 DIAGNOSIS — Z79899 Other long term (current) drug therapy: Secondary | ICD-10-CM | POA: Insufficient documentation

## 2024-09-28 DIAGNOSIS — Z8701 Personal history of pneumonia (recurrent): Secondary | ICD-10-CM | POA: Insufficient documentation

## 2024-09-28 DIAGNOSIS — I129 Hypertensive chronic kidney disease with stage 1 through stage 4 chronic kidney disease, or unspecified chronic kidney disease: Secondary | ICD-10-CM | POA: Insufficient documentation

## 2024-09-28 DIAGNOSIS — Z8419 Family history of other disorders of kidney and ureter: Secondary | ICD-10-CM | POA: Diagnosis not present

## 2024-09-28 DIAGNOSIS — E781 Pure hyperglyceridemia: Secondary | ICD-10-CM | POA: Diagnosis not present

## 2024-09-28 DIAGNOSIS — E269 Hyperaldosteronism, unspecified: Secondary | ICD-10-CM

## 2024-09-28 DIAGNOSIS — Z87442 Personal history of urinary calculi: Secondary | ICD-10-CM | POA: Insufficient documentation

## 2024-09-28 NOTE — Assessment & Plan Note (Addendum)
 IgA Lamda MGUS M spike 1.5g/dl. normal light chain ratio.  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 Lab Results  Component Value Date   MPROTEIN 1.7 (H) 08/19/2024   KPAFRELGTCHN 19.0 08/19/2024   LAMBDASER 35.2 (H) 08/19/2024   KAPLAMBRATIO 0.54 08/19/2024    04/08/2024 bone marrow biopsy showed 10 to 11% of plasma cells. Kappa/lambda ISH reveal a polyclonal pattern with slight lambda excess but do not reveal any evidence of clonality.    normal cytogenetics. Normal myeloma FISH  Active multiple myeloma diagnosis criteria was not met. Currently no anemia, renal function impairment, hypercalcemia.  Creatinine is slightly elevated.  Recommend patient to increase hydration. Recommend continue observation, repeat myeloma labs in 4 months. Check skeletal survey.

## 2024-09-28 NOTE — Patient Instructions (Signed)
Skeletal survey/ bone survey- this scan is a walk-in and no appointment is needed. You may go to the medical mall at your own convenience to have this scan done.

## 2024-09-28 NOTE — Assessment & Plan Note (Signed)
 Follow-up with nephrology.  Avoid nephro toxins.

## 2024-09-28 NOTE — Patient Instructions (Addendum)
 Epleronone 50 mg twice daily  Continue Metoprolol  100 mg daily  Continue  Lotrel 5-10 mg, 1 tablet daily

## 2024-09-28 NOTE — Progress Notes (Signed)
 Name: Shawn Vaughn  MRN/ DOB: [id number], 03-04-1971    Age/ Sex: 53 y.o., male    PCP: Rilla Baller, MD   Reason for Endocrinology Evaluation: HTN     Date of Initial Endocrinology Evaluation: 04/09/2023    HPI: [name] is a 53 y.o. male with a past medical history of HTN, Lyme disease, nephrolithiasis, and dyslipidemia. The patient presented for initial endocrinology clinic visit on 04/09/2023 for consultative assistance with his HTN.     In reviewing his chart, patient has been noted with elevated Aldo/PRA ratio and aldosterone level of 12 mg/DL, suppressed renin 9.93 NG/mL/H on 02/06/2022   At the time the patient was on antihypertensive medications to include amlodipine -benazepril , and Toprol -XL   Patient was evaluated by [facility] on 03/29/2022 for evaluation of CKD III  and refractory HTN.  HCTZ was added at the time  Repeat labs confirmed elevated Aldo/PRA ratio at 127.3 with aldosterone level of 14 NG/DL , suppressed renin 9.88 NG/mL/H ,  at the time he was noted with elevated a.m. cortisol at 26.7 mcg/DL (reference 5-77)   CT imaging of the abdomen 05/28/2022 showed normal adrenal glands  Has been Dx with HTN ~ 2014    Mother with HTN  SUBJECTIVE:    Today (09/28/24):  Shawn Vaughn is here for a follow up on hyperaldosteronism.    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 Marcelino) for CKD  Checks Bp at home, ~ 118/70  No headaches  No SOB  No LE edema  No constipation or diarrhea  No dizziness     HOME ENDOCRINE MEDICATIONS: Eplerenone  50 mg BID Metoprolol  100 mg XL daily Lotrel 5-10 mg daily   HISTORY:  Past Medical History:  Past Medical History:  Diagnosis Date   History of chicken pox    History of pneumonia 11/19/2000   staph   Hypertension    Hypertriglyceridemia    Lyme disease 05/20/2015   Nephrolithiasis 07/21/2015   with R  hydronephrosis that resolved   Shingles 04/2021   L flank   Solitary pulmonary nodule on lung CT 09/21/2015   4mm RLL, no f/u needed    Past Surgical History:  Past Surgical History:  Procedure Laterality Date   COLONOSCOPY WITH PROPOFOL  N/A 09/25/2023   diverticulosis, WNL Romero)   IR BONE MARROW BIOPSY & ASPIRATION  04/08/2024   US  ECHOCARDIOGRAPHY  11/19/2000   WNL per paper chart, EF >55%    Social History:  reports that he has never smoked. He has never used smokeless tobacco. He reports that he does not drink alcohol and does not use drugs. Family History: family history includes Cancer (age of onset: 51) in his maternal grandfather; Hypertension in his mother; Nephrolithiasis in his paternal uncle; Prostate cancer in his maternal uncle.   HOME MEDICATIONS: Allergies as of 09/28/2024       Reactions   Ibuprofen    Blood in stool        Medication List        Accurate as of September 28, 2024 12:04 PM. If you have any questions, ask your nurse or doctor.          amLODipine -benazepril  5-10 MG capsule Commonly known as: Lotrel Take 1 capsule by mouth daily.   ascorbic acid  500 MG tablet Commonly known as: VITAMIN C Take 500 mg by mouth daily.   eplerenone  50 MG tablet Commonly known as: INSPRA  Take  1 tablet (50 mg total) by mouth 2 (two) times daily.   metoprolol  succinate 100 MG 24 hr tablet Commonly known as: TOPROL -XL Take 1 tablet (100 mg total) by mouth daily.   Vitamin D3 50 MCG (2000 UT) capsule Take 1 capsule (2,000 Units total) by mouth daily.          REVIEW OF SYSTEMS: A comprehensive ROS was conducted with the patient and is negative except as per HPI  OBJECTIVE:  VS: There were no vitals taken for this visit.   Wt Readings from Last 3 Encounters:  04/29/24 185 lb 3.2 oz (84 kg)  04/17/24 182 lb 2 oz (82.6 kg)  03/27/24 182 lb 9.6 oz (82.8 kg)     EXAM: General: Pt appears well and is in NAD  Neck: General: Supple without  adenopathy. Thyroid : Thyroid  size normal.  No goiter or nodules appreciated.  Lungs: Clear with good BS bilat   Heart: Auscultation: RRR.  Abdomen: Soft, nontender  Extremities:  BL LE: No pretibial edema   Mental Status: Judgment, insight: Intact Orientation: Oriented to time, place, and person Mood and affect: No depression, anxiety, or agitation     DATA REVIEWED:    Latest Reference Range & Units 08/19/24 08:00  Sodium 135 - 145 mmol/L 134 (L)  Potassium 3.5 - 5.1 mmol/L 4.3  Chloride 98 - 111 mmol/L 102  CO2 22 - 32 mmol/L 24  Glucose 70 - 99 mg/dL 893 (H)  BUN 6 - 20 mg/dL 22 (H)  Creatinine 9.38 - 1.24 mg/dL 8.72 (H)  Calcium  8.9 - 10.3 mg/dL 9.2  Anion gap 5 - 15  8  Alkaline Phosphatase 38 - 126 U/L 61  Albumin 3.5 - 5.0 g/dL 4.2  AST 15 - 41 U/L 26  ALT 0 - 44 U/L 23  Total Protein 6.5 - 8.1 g/dL 8.4 (H)  Total Bilirubin 0.0 - 1.2 mg/dL 1.1  GFR, Est Non African American >60 mL/min >60    Latest Reference Range & Units 10/11/23 07:54  Renin Activity 0.25 - 5.82 ng/mL/h 0.26      Old records , labs and images have been reviewed.     ASSESSMENT/PLAN/RECOMMENDATIONS:   Hyperaldosteronism :   - Pt with hyperaldosteronism, NO need for confirmation testing as his renin is suppressed in the setting for ACEi intake  -  We discussed localization testing with adrenal venous sampling -The patient is NOT  interested in invasive testing at this time, we opted to start eplerenone  for medical management - His diastolic is elevated here in the office x 2.  BP at home is normal with diastolic BP in the 70s - Patient to continue to monitor BP at home and notify our office should his BP remain140/90 or higher - Adrenal imaging was negative in 2023  Medications : Continue eplerenone  50 mg twice daily Continue metoprolol  100 mg XL daily Continue Lotrel 5-10  mg daily   Follow-up in 6 months  Signed electronically by: Stefano Redgie Butts, MD  [facility]  Endocrinology  [facility] [address] [place] [place], [place] [address] Phone: [phone] FAX: [phone]   CC: Rilla Baller, MD [address] [place] [address] Phone: [phone] Fax: [phone]   Return to Endocrinology clinic as below: Future Appointments  Date Time Provider Department Center  09/28/2024 12:10 PM Abbygale Lapid, Donell Redgie, MD [facility] None  09/28/2024  1:45 PM Babara Call, MD [facility] None  04/23/2025  8:00 AM Rilla Baller, MD [facility]  [phone]

## 2024-09-28 NOTE — Progress Notes (Signed)
 Hematology/Oncology Progress note [phone] [phone]     DOB: 1971/05/03  MR#: [id number]  RDW#:[id number]  Patient Care Team: Rilla Baller, MD as PCP - General (Family Medicine) Babara Call, MD as Consulting Physician (Oncology)   ASSESSMENT & PLAN:   MGUS (monoclonal gammopathy of unknown significance) IgA Lamda MGUS M spike 1.5g/dl. normal light chain ratio.  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 Lab Results  Component Value Date   MPROTEIN 1.7 (H) 08/19/2024   KPAFRELGTCHN 19.0 08/19/2024   LAMBDASER 35.2 (H) 08/19/2024   KAPLAMBRATIO 0.54 08/19/2024    04/08/2024 bone marrow biopsy showed 10 to 11% of plasma cells. Kappa/lambda ISH reveal a polyclonal pattern with slight lambda excess but do not reveal any evidence of clonality.    normal cytogenetics. Normal myeloma FISH  Active multiple myeloma diagnosis criteria was not met. Currently no anemia, renal function impairment, hypercalcemia.  Creatinine is slightly elevated.  Recommend patient to increase hydration. Recommend continue observation, repeat myeloma labs in 4 months. Check skeletal survey.   CKD (chronic kidney disease) stage 2, GFR 60-89 ml/min Follow-up with nephrology.  Avoid nephro toxins.   Orders Placed This Encounter  Procedures   CBC with Differential (Cancer Center Only)    Standing Status:   Future    Expected Date:   01/26/2025    Expiration Date:   04/26/2025   CMP (Cancer Center only)    Standing Status:   Future    Expected Date:   01/26/2025    Expiration Date:   04/26/2025   Multiple Myeloma Panel (SPEP&IFE w/QIG)    Standing Status:   Future    Expected Date:   01/26/2025    Expiration Date:   04/26/2025   Kappa/lambda light chains    Standing Status:   Future    Expected Date:   01/26/2025    Expiration Date:   04/26/2025   Follow up in 4 months All questions were answered. The patient knows to call the clinic with any problems, questions or concerns.  Call Babara,  MD, PhD [facility]ematology Oncology 09/28/2024   HPI: Shawn Vaughn is a 53 y.o. 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 Patient previously followed up with Dr.Agrawal. Patient switched care to me on 04/29/1024 Extensive medical record review was performed by me Patient reports that he was found to have kidney disease sometime in July 2023.  Recently, creatinine has normalized.  No anemia.   04/08/2024 Bone marrow biopsy showed  BONE MARROW, ASPIRATE, CLOT, CORE:  - Mildly hypercellular bone marrow (70%) with increased plasma cells.  PERIPHERAL BLOOD:  - No significant pathologic changes   COMMENT:  Morphologic evaluation reveals a mildly hypercellular bone marrow with slightly increased plasma cells (approximately 11% by manual aspirate differential and up to 10% by CD138 immunohistochemical analysis). Kappa/lambda ISH reveal a polyclonal pattern with slight lambda excess but do not reveal any evidence of clonality.    normal cytogenetics. Normal myeloma FISH  Interval history Patient was seen today as follow-up of IgA lambda MGUS. Patient has been doing well overall.  Denies any concerns today. He has not had skeletal bone survey done yet.  No new complaints.  Accompanied by mother.    PAST MEDICAL HISTORY: Past Medical History:  Diagnosis Date   History of chicken pox    History of pneumonia 11/19/2000   staph   Hypertension    Hypertriglyceridemia    Lyme disease 05/20/2015  Nephrolithiasis 07/21/2015   with R hydronephrosis that resolved   Shingles 04/2021   L flank   Solitary pulmonary nodule on lung CT 09/21/2015   4mm RLL, no f/u needed     PAST SURGICAL HISTORY: Past Surgical History:  Procedure Laterality Date   COLONOSCOPY WITH PROPOFOL  N/A 09/25/2023   diverticulosis, WNL Romero)   IR BONE MARROW BIOPSY & ASPIRATION  04/08/2024   US  ECHOCARDIOGRAPHY  11/19/2000    WNL per paper chart, EF >55%    Family History  Problem Relation Age of Onset   Hypertension Mother    Hyperlipidemia Neg Hx    Diabetes Neg Hx    Coronary artery disease Neg Hx    Stroke Neg Hx    Cancer Maternal Grandfather 78       prostate   Prostate cancer Maternal Uncle    Nephrolithiasis Paternal Uncle     Social History   Tobacco Use   Smoking status: Never   Smokeless tobacco: Never  Vaping Use   Vaping status: Never Used  Substance Use Topics   Alcohol use: No   Drug use: No     Allergies  Allergen Reactions   Ibuprofen     Blood in stool    Current Outpatient Medications  Medication Sig Dispense Refill   amLODipine -benazepril  (LOTREL) 5-10 MG capsule Take 1 capsule by mouth daily. 90 capsule 3   ascorbic acid  (VITAMIN C) 500 MG tablet Take 500 mg by mouth daily.     Cholecalciferol  (VITAMIN D3) 50 MCG (2000 UT) CAPS Take 1 capsule (2,000 Units total) by mouth daily. 30 capsule    eplerenone  (INSPRA ) 50 MG tablet Take 1 tablet (50 mg total) by mouth 2 (two) times daily. 180 tablet 3   metoprolol  succinate (TOPROL -XL) 100 MG 24 hr tablet Take 1 tablet (100 mg total) by mouth daily. 90 tablet 4   No current facility-administered medications for this visit.    OBJECTIVE: Vitals:   09/28/24 1349 09/28/24 1356  BP: (!) 123/94 134/86  Pulse: 73   Resp: 18   Temp: (!) 97.3 F (36.3 C)   SpO2: 100%      Body mass index is 28.3 kg/m.      Physical Exam Constitutional:      General: He is not in acute distress. HENT:     Head: Normocephalic and atraumatic.  Eyes:     General: No scleral icterus. Cardiovascular:     Rate and Rhythm: Normal rate and regular rhythm.  Pulmonary:     Effort: Pulmonary effort is normal. No respiratory distress.     Breath sounds: No wheezing.  Abdominal:     General: Bowel sounds are normal. There is no distension.     Palpations: Abdomen is soft.  Musculoskeletal:        General: No deformity. Normal range of  motion.     Cervical back: Normal range of motion and neck supple.  Skin:    General: Skin is warm and dry.     Findings: No erythema or rash.  Neurological:     Mental Status: He is alert and oriented to person, place, and time. Mental status is at baseline.  Psychiatric:        Mood and Affect: Mood normal.      Lab Results  Component Value Date   NA 134 (L) 08/19/2024   K 4.3 08/19/2024   CL 102 08/19/2024   CO2 24 08/19/2024   GLUCOSE 106 (H) 08/19/2024  BUN 22 (H) 08/19/2024   CREATININE 1.27 (H) 08/19/2024   CALCIUM  9.2 08/19/2024   PROT 8.4 (H) 08/19/2024   ALBUMIN 4.2 08/19/2024   AST 26 08/19/2024   ALT 23 08/19/2024   ALKPHOS 61 08/19/2024   BILITOT 1.1 08/19/2024   GFRNONAA >60 08/19/2024   GFRAA 53 (L) 07/20/2015    Lab Results  Component Value Date   WBC 4.6 08/19/2024   NEUTROABS 2.0 08/19/2024   HGB 16.1 08/19/2024   HCT 45.3 08/19/2024   MCV 87.5 08/19/2024   PLT 245 08/19/2024    No results found for: TIBC, FERRITIN, IRONPCTSAT   STUDIES: No results found.

## 2024-10-01 ENCOUNTER — Ambulatory Visit: Payer: Self-pay | Admitting: Oncology

## 2025-01-11 ENCOUNTER — Inpatient Hospital Stay

## 2025-01-20 ENCOUNTER — Inpatient Hospital Stay: Admitting: Oncology

## 2025-03-22 ENCOUNTER — Ambulatory Visit: Admitting: Internal Medicine

## 2025-04-23 ENCOUNTER — Encounter: Admitting: Family Medicine
